# Patient Record
Sex: Female | Born: 1945 | Race: White | Hispanic: No | Marital: Married | Smoking: Former smoker
Health system: Southern US, Community
[De-identification: ages and names within clinical notes are randomized; demographics above are authoritative.]

## PROBLEM LIST (undated history)

## (undated) DIAGNOSIS — E785 Hyperlipidemia, unspecified: Secondary | ICD-10-CM

## (undated) DIAGNOSIS — R0989 Other specified symptoms and signs involving the circulatory and respiratory systems: Secondary | ICD-10-CM

## (undated) DIAGNOSIS — M810 Age-related osteoporosis without current pathological fracture: Secondary | ICD-10-CM

## (undated) DIAGNOSIS — C801 Malignant (primary) neoplasm, unspecified: Secondary | ICD-10-CM

## (undated) DIAGNOSIS — M1990 Unspecified osteoarthritis, unspecified site: Secondary | ICD-10-CM

## (undated) DIAGNOSIS — J449 Chronic obstructive pulmonary disease, unspecified: Secondary | ICD-10-CM

## (undated) DIAGNOSIS — R7309 Other abnormal glucose: Secondary | ICD-10-CM

## (undated) DIAGNOSIS — D489 Neoplasm of uncertain behavior, unspecified: Secondary | ICD-10-CM

## (undated) HISTORY — PX: MANDIBLE SURGERY: SHX707

## (undated) HISTORY — DX: Hyperlipidemia, unspecified: E78.5

## (undated) HISTORY — DX: Other specified symptoms and signs involving the circulatory and respiratory systems: R09.89

## (undated) HISTORY — PX: TONSILLECTOMY: SUR1361

## (undated) HISTORY — DX: Other abnormal glucose: R73.09

## (undated) HISTORY — DX: Neoplasm of uncertain behavior, unspecified: D48.9

---

## 1998-06-30 ENCOUNTER — Ambulatory Visit (HOSPITAL_COMMUNITY): Admission: RE | Admit: 1998-06-30 | Discharge: 1998-06-30 | Payer: Self-pay | Admitting: *Deleted

## 1998-08-14 ENCOUNTER — Ambulatory Visit (HOSPITAL_COMMUNITY): Admission: RE | Admit: 1998-08-14 | Discharge: 1998-08-14 | Payer: Self-pay | Admitting: *Deleted

## 1999-08-06 ENCOUNTER — Ambulatory Visit (HOSPITAL_COMMUNITY): Admission: RE | Admit: 1999-08-06 | Discharge: 1999-08-06 | Payer: Self-pay | Admitting: *Deleted

## 2001-10-03 ENCOUNTER — Ambulatory Visit (HOSPITAL_COMMUNITY): Admission: RE | Admit: 2001-10-03 | Discharge: 2001-10-03 | Payer: Self-pay | Admitting: Unknown Physician Specialty

## 2001-10-03 ENCOUNTER — Encounter: Payer: Self-pay | Admitting: Unknown Physician Specialty

## 2002-04-24 ENCOUNTER — Ambulatory Visit (HOSPITAL_COMMUNITY): Admission: RE | Admit: 2002-04-24 | Discharge: 2002-04-24 | Payer: Self-pay | Admitting: Internal Medicine

## 2002-04-24 ENCOUNTER — Encounter: Payer: Self-pay | Admitting: Internal Medicine

## 2002-10-09 ENCOUNTER — Ambulatory Visit (HOSPITAL_COMMUNITY): Admission: RE | Admit: 2002-10-09 | Discharge: 2002-10-09 | Payer: Self-pay | Admitting: Unknown Physician Specialty

## 2002-10-09 ENCOUNTER — Encounter: Payer: Self-pay | Admitting: Unknown Physician Specialty

## 2004-04-21 ENCOUNTER — Ambulatory Visit (HOSPITAL_COMMUNITY): Admission: RE | Admit: 2004-04-21 | Discharge: 2004-04-21 | Payer: Self-pay | Admitting: Internal Medicine

## 2005-05-04 ENCOUNTER — Ambulatory Visit (HOSPITAL_COMMUNITY): Admission: RE | Admit: 2005-05-04 | Discharge: 2005-05-04 | Payer: Self-pay | Admitting: Unknown Physician Specialty

## 2006-02-22 DIAGNOSIS — D489 Neoplasm of uncertain behavior, unspecified: Secondary | ICD-10-CM

## 2006-02-22 HISTORY — DX: Neoplasm of uncertain behavior, unspecified: D48.9

## 2006-05-16 ENCOUNTER — Ambulatory Visit (HOSPITAL_COMMUNITY): Admission: RE | Admit: 2006-05-16 | Discharge: 2006-05-16 | Payer: Self-pay | Admitting: Internal Medicine

## 2007-09-08 ENCOUNTER — Ambulatory Visit (HOSPITAL_COMMUNITY): Admission: RE | Admit: 2007-09-08 | Discharge: 2007-09-08 | Payer: Self-pay | Admitting: Internal Medicine

## 2008-10-17 ENCOUNTER — Ambulatory Visit (HOSPITAL_COMMUNITY): Admission: RE | Admit: 2008-10-17 | Discharge: 2008-10-17 | Payer: Self-pay | Admitting: Internal Medicine

## 2008-10-22 ENCOUNTER — Encounter: Admission: RE | Admit: 2008-10-22 | Discharge: 2008-10-22 | Payer: Self-pay | Admitting: Internal Medicine

## 2009-12-04 ENCOUNTER — Ambulatory Visit (HOSPITAL_COMMUNITY): Admission: RE | Admit: 2009-12-04 | Discharge: 2009-12-04 | Payer: Self-pay | Admitting: Internal Medicine

## 2010-03-16 ENCOUNTER — Encounter: Payer: Self-pay | Admitting: Internal Medicine

## 2010-11-04 ENCOUNTER — Other Ambulatory Visit (HOSPITAL_COMMUNITY): Payer: Self-pay | Admitting: Internal Medicine

## 2010-11-04 ENCOUNTER — Ambulatory Visit (HOSPITAL_COMMUNITY)
Admission: RE | Admit: 2010-11-04 | Discharge: 2010-11-04 | Disposition: A | Discharge status: Home / Self Care | Payer: BC Managed Care – PPO | Source: Ambulatory Visit | Attending: Internal Medicine | Admitting: Internal Medicine

## 2010-11-04 DIAGNOSIS — Z0000 Encounter for general adult medical examination without abnormal findings: Secondary | ICD-10-CM

## 2010-11-04 DIAGNOSIS — J4489 Other specified chronic obstructive pulmonary disease: Secondary | ICD-10-CM | POA: Insufficient documentation

## 2010-11-04 DIAGNOSIS — J449 Chronic obstructive pulmonary disease, unspecified: Secondary | ICD-10-CM | POA: Insufficient documentation

## 2010-11-04 DIAGNOSIS — Z87891 Personal history of nicotine dependence: Secondary | ICD-10-CM | POA: Insufficient documentation

## 2010-11-04 DIAGNOSIS — R911 Solitary pulmonary nodule: Secondary | ICD-10-CM | POA: Insufficient documentation

## 2010-11-05 ENCOUNTER — Other Ambulatory Visit: Payer: Self-pay | Admitting: Internal Medicine

## 2010-11-05 DIAGNOSIS — R9389 Abnormal findings on diagnostic imaging of other specified body structures: Secondary | ICD-10-CM

## 2010-11-06 ENCOUNTER — Ambulatory Visit
Admission: RE | Admit: 2010-11-06 | Discharge: 2010-11-06 | Disposition: A | Discharge status: Home / Self Care | Payer: BC Managed Care – PPO | Source: Ambulatory Visit | Attending: Internal Medicine | Admitting: Internal Medicine

## 2010-11-06 DIAGNOSIS — R9389 Abnormal findings on diagnostic imaging of other specified body structures: Secondary | ICD-10-CM

## 2010-11-11 ENCOUNTER — Other Ambulatory Visit: Payer: Self-pay | Admitting: Internal Medicine

## 2010-11-11 DIAGNOSIS — E041 Nontoxic single thyroid nodule: Secondary | ICD-10-CM

## 2010-11-13 ENCOUNTER — Other Ambulatory Visit: Payer: BC Managed Care – PPO

## 2010-11-17 ENCOUNTER — Ambulatory Visit
Admission: RE | Admit: 2010-11-17 | Discharge: 2010-11-17 | Disposition: A | Discharge status: Home / Self Care | Payer: BC Managed Care – PPO | Source: Ambulatory Visit | Attending: Internal Medicine | Admitting: Internal Medicine

## 2010-11-17 DIAGNOSIS — E041 Nontoxic single thyroid nodule: Secondary | ICD-10-CM

## 2010-12-22 ENCOUNTER — Other Ambulatory Visit (HOSPITAL_COMMUNITY): Payer: Self-pay | Admitting: Obstetrics and Gynecology

## 2010-12-22 DIAGNOSIS — Z1231 Encounter for screening mammogram for malignant neoplasm of breast: Secondary | ICD-10-CM

## 2011-01-18 ENCOUNTER — Ambulatory Visit (HOSPITAL_COMMUNITY)
Admission: RE | Admit: 2011-01-18 | Discharge: 2011-01-18 | Disposition: A | Discharge status: Home / Self Care | Payer: BC Managed Care – PPO | Source: Ambulatory Visit | Attending: Obstetrics and Gynecology | Admitting: Obstetrics and Gynecology

## 2011-01-18 DIAGNOSIS — Z1231 Encounter for screening mammogram for malignant neoplasm of breast: Secondary | ICD-10-CM | POA: Insufficient documentation

## 2011-05-17 ENCOUNTER — Other Ambulatory Visit: Payer: Self-pay | Admitting: Internal Medicine

## 2011-05-17 DIAGNOSIS — E041 Nontoxic single thyroid nodule: Secondary | ICD-10-CM

## 2011-05-20 ENCOUNTER — Other Ambulatory Visit: Payer: BC Managed Care – PPO

## 2011-05-26 ENCOUNTER — Ambulatory Visit
Admission: RE | Admit: 2011-05-26 | Discharge: 2011-05-26 | Disposition: A | Discharge status: Home / Self Care | Payer: BC Managed Care – PPO | Source: Ambulatory Visit | Attending: Internal Medicine | Admitting: Internal Medicine

## 2011-05-26 DIAGNOSIS — E041 Nontoxic single thyroid nodule: Secondary | ICD-10-CM

## 2012-01-28 ENCOUNTER — Other Ambulatory Visit (HOSPITAL_COMMUNITY): Payer: Self-pay | Admitting: Obstetrics and Gynecology

## 2012-01-28 DIAGNOSIS — Z1231 Encounter for screening mammogram for malignant neoplasm of breast: Secondary | ICD-10-CM

## 2012-02-28 ENCOUNTER — Ambulatory Visit (HOSPITAL_COMMUNITY): Payer: BC Managed Care – PPO

## 2012-03-16 ENCOUNTER — Ambulatory Visit (HOSPITAL_COMMUNITY)
Admission: RE | Admit: 2012-03-16 | Discharge: 2012-03-16 | Disposition: A | Discharge status: Home / Self Care | Payer: BC Managed Care – PPO | Source: Ambulatory Visit | Attending: Obstetrics and Gynecology | Admitting: Obstetrics and Gynecology

## 2012-03-16 DIAGNOSIS — Z1231 Encounter for screening mammogram for malignant neoplasm of breast: Secondary | ICD-10-CM

## 2012-09-19 ENCOUNTER — Encounter (HOSPITAL_COMMUNITY)
Admission: EM | Disposition: A | Discharge status: Home Health | Payer: Self-pay | Source: Home / Self Care | Attending: Orthopedic Surgery

## 2012-09-19 ENCOUNTER — Inpatient Hospital Stay (HOSPITAL_COMMUNITY): Payer: Medicare PPO

## 2012-09-19 ENCOUNTER — Ambulatory Visit (HOSPITAL_COMMUNITY)
Admission: RE | Admit: 2012-09-19 | Discharge: 2012-09-19 | Disposition: A | Discharge status: Home / Self Care | Payer: Medicare PPO | Source: Ambulatory Visit | Attending: Internal Medicine | Admitting: Internal Medicine

## 2012-09-19 ENCOUNTER — Inpatient Hospital Stay (HOSPITAL_COMMUNITY)
Admission: EM | Admit: 2012-09-19 | Discharge: 2012-09-20 | DRG: 482 | Disposition: A | Discharge status: Home Health | Payer: Medicare PPO | Attending: Orthopedic Surgery | Admitting: Orthopedic Surgery

## 2012-09-19 ENCOUNTER — Other Ambulatory Visit (HOSPITAL_COMMUNITY): Payer: Self-pay | Admitting: Internal Medicine

## 2012-09-19 ENCOUNTER — Inpatient Hospital Stay (HOSPITAL_COMMUNITY): Payer: Medicare PPO | Admitting: Anesthesiology

## 2012-09-19 ENCOUNTER — Encounter (HOSPITAL_COMMUNITY): Payer: Self-pay

## 2012-09-19 ENCOUNTER — Encounter (HOSPITAL_COMMUNITY): Payer: Self-pay | Admitting: Anesthesiology

## 2012-09-19 DIAGNOSIS — J449 Chronic obstructive pulmonary disease, unspecified: Secondary | ICD-10-CM | POA: Diagnosis present

## 2012-09-19 DIAGNOSIS — M25552 Pain in left hip: Secondary | ICD-10-CM

## 2012-09-19 DIAGNOSIS — J4489 Other specified chronic obstructive pulmonary disease: Secondary | ICD-10-CM | POA: Diagnosis present

## 2012-09-19 DIAGNOSIS — M1990 Unspecified osteoarthritis, unspecified site: Secondary | ICD-10-CM

## 2012-09-19 DIAGNOSIS — W010XXA Fall on same level from slipping, tripping and stumbling without subsequent striking against object, initial encounter: Secondary | ICD-10-CM | POA: Diagnosis present

## 2012-09-19 DIAGNOSIS — Y92009 Unspecified place in unspecified non-institutional (private) residence as the place of occurrence of the external cause: Secondary | ICD-10-CM

## 2012-09-19 DIAGNOSIS — Z79899 Other long term (current) drug therapy: Secondary | ICD-10-CM

## 2012-09-19 DIAGNOSIS — Z8249 Family history of ischemic heart disease and other diseases of the circulatory system: Secondary | ICD-10-CM

## 2012-09-19 DIAGNOSIS — Z87891 Personal history of nicotine dependence: Secondary | ICD-10-CM

## 2012-09-19 DIAGNOSIS — Z806 Family history of leukemia: Secondary | ICD-10-CM

## 2012-09-19 DIAGNOSIS — S72033A Displaced midcervical fracture of unspecified femur, initial encounter for closed fracture: Principal | ICD-10-CM | POA: Diagnosis present

## 2012-09-19 DIAGNOSIS — S7292XA Unspecified fracture of left femur, initial encounter for closed fracture: Secondary | ICD-10-CM

## 2012-09-19 DIAGNOSIS — Z7982 Long term (current) use of aspirin: Secondary | ICD-10-CM

## 2012-09-19 DIAGNOSIS — S7290XA Unspecified fracture of unspecified femur, initial encounter for closed fracture: Secondary | ICD-10-CM

## 2012-09-19 DIAGNOSIS — M810 Age-related osteoporosis without current pathological fracture: Secondary | ICD-10-CM | POA: Diagnosis present

## 2012-09-19 HISTORY — DX: Age-related osteoporosis without current pathological fracture: M81.0

## 2012-09-19 HISTORY — DX: Unspecified osteoarthritis, unspecified site: M19.90

## 2012-09-19 HISTORY — DX: Chronic obstructive pulmonary disease, unspecified: J44.9

## 2012-09-19 HISTORY — PX: HIP PINNING,CANNULATED: SHX1758

## 2012-09-19 LAB — CBC WITH DIFFERENTIAL/PLATELET
Basophils Absolute: 0 10*3/uL (ref 0.0–0.1)
Basophils Relative: 0 % (ref 0–1)
Eosinophils Absolute: 0.2 10*3/uL (ref 0.0–0.7)
HCT: 41.8 % (ref 36.0–46.0)
MCHC: 34 g/dL (ref 30.0–36.0)
MCV: 91.3 fL (ref 78.0–100.0)
Neutro Abs: 7.3 10*3/uL (ref 1.7–7.7)
Platelets: 248 10*3/uL (ref 150–400)
RBC: 4.58 MIL/uL (ref 3.87–5.11)
RDW: 12.9 % (ref 11.5–15.5)

## 2012-09-19 LAB — BASIC METABOLIC PANEL
BUN: 9 mg/dL (ref 6–23)
Chloride: 101 mEq/L (ref 96–112)
Creatinine, Ser: 0.5 mg/dL (ref 0.50–1.10)
GFR calc Af Amer: >90 mL/min (ref >90)
Glucose, Bld: 122 mg/dL — ABNORMAL HIGH (ref 70–99)
Potassium: 3.9 mEq/L (ref 3.5–5.1)
Sodium: 139 mEq/L (ref 135–145)

## 2012-09-19 LAB — PROTIME-INR: INR: 0.96 (ref 0.00–1.49)

## 2012-09-19 SURGERY — FIXATION, FEMUR, NECK, PERCUTANEOUS, USING SCREW
Anesthesia: Spinal | Site: Hip | Laterality: Left | Wound class: Clean

## 2012-09-19 MED ORDER — HYDROMORPHONE HCL PF 1 MG/ML IJ SOLN
0.2500 mg | INTRAMUSCULAR | Status: DC | PRN
  Administered 2012-09-19 (×2): 0.5 mg via INTRAVENOUS

## 2012-09-19 MED ORDER — KETOROLAC TROMETHAMINE 30 MG/ML IJ SOLN: 15.0000 mg | Freq: Once | INTRAMUSCULAR | Status: AC | PRN

## 2012-09-19 MED ORDER — HYDROMORPHONE HCL PF 1 MG/ML IJ SOLN
INTRAMUSCULAR | Status: AC
  Filled 2012-09-19: qty 1

## 2012-09-19 MED ORDER — ONDANSETRON HCL 4 MG/2ML IJ SOLN
INTRAMUSCULAR | Status: DC | PRN
  Administered 2012-09-19: 4 mg via INTRAVENOUS

## 2012-09-19 MED ORDER — ONDANSETRON HCL 4 MG/2ML IJ SOLN: 4.0000 mg | Freq: Once | INTRAMUSCULAR | Status: AC | PRN

## 2012-09-19 MED ORDER — GLYCOPYRROLATE 0.2 MG/ML IJ SOLN
INTRAMUSCULAR | Status: DC | PRN
  Administered 2012-09-19: 0.2 mg via INTRAVENOUS

## 2012-09-19 MED ORDER — FENTANYL CITRATE 0.05 MG/ML IJ SOLN
INTRAMUSCULAR | Status: DC | PRN
  Administered 2012-09-19 (×2): 25 ug via INTRAVENOUS
  Administered 2012-09-19: 100 ug via INTRAVENOUS

## 2012-09-19 MED ORDER — CEFAZOLIN SODIUM-DEXTROSE 2-3 GM-% IV SOLR
INTRAVENOUS | Status: DC | PRN
  Administered 2012-09-19: 2 g via INTRAVENOUS

## 2012-09-19 MED ORDER — CEFAZOLIN SODIUM 1-5 GM-% IV SOLN
INTRAVENOUS | Status: AC
  Filled 2012-09-19: qty 100

## 2012-09-19 MED ORDER — LACTATED RINGERS IV SOLN
INTRAVENOUS | Status: DC | PRN
  Administered 2012-09-19: 21:00:00 via INTRAVENOUS

## 2012-09-19 MED ORDER — 0.9 % SODIUM CHLORIDE (POUR BTL) OPTIME
TOPICAL | Status: DC | PRN
  Administered 2012-09-19: 1000 mL

## 2012-09-19 MED ORDER — PROPOFOL 10 MG/ML IV BOLUS
INTRAVENOUS | Status: DC | PRN
  Administered 2012-09-19: 160 mg via INTRAVENOUS

## 2012-09-19 MED ORDER — LIDOCAINE HCL (CARDIAC) 20 MG/ML IV SOLN
INTRAVENOUS | Status: DC | PRN
  Administered 2012-09-19: 50 mg via INTRAVENOUS

## 2012-09-19 SURGICAL SUPPLY — 40 items
BANDAGE GAUZE ELAST BULKY 4 IN (GAUZE/BANDAGES/DRESSINGS) ×2 IMPLANT
BLADE SURG 10 STRL SS (BLADE) ×2 IMPLANT
BNDG COHESIVE 4X5 TAN STRL (GAUZE/BANDAGES/DRESSINGS) ×2 IMPLANT
CHLORAPREP W/TINT 26ML (MISCELLANEOUS) ×2 IMPLANT
CLOTH BEACON ORANGE TIMEOUT ST (SAFETY) ×2 IMPLANT
COVER MAYO STAND STRL (DRAPES) ×2 IMPLANT
COVER SURGICAL LIGHT HANDLE (MISCELLANEOUS) ×2 IMPLANT
DRAPE ORTHO SPLIT 77X108 STRL (DRAPES) ×4
DRAPE STERI IOBAN 125X83 (DRAPES) ×2 IMPLANT
DRAPE SURG 17X23 STRL (DRAPES) ×8 IMPLANT
DRAPE SURG ORHT 6 SPLT 77X108 (DRAPES) ×2 IMPLANT
DRSG MEPILEX BORDER 4X4 (GAUZE/BANDAGES/DRESSINGS) ×2 IMPLANT
DRSG PAD ABDOMINAL 8X10 ST (GAUZE/BANDAGES/DRESSINGS) ×6 IMPLANT
ELECT REM PT RETURN 9FT ADLT (ELECTROSURGICAL) ×2
ELECTRODE REM PT RTRN 9FT ADLT (ELECTROSURGICAL) ×1 IMPLANT
GLOVE BIO SURGEON STRL SZ7 (GLOVE) ×2 IMPLANT
GLOVE BIO SURGEON STRL SZ7.5 (GLOVE) ×2 IMPLANT
GLOVE BIOGEL PI IND STRL 8 (GLOVE) ×1 IMPLANT
GLOVE BIOGEL PI INDICATOR 8 (GLOVE) ×1
GOWN PREVENTION PLUS LG XLONG (DISPOSABLE) ×2 IMPLANT
GOWN PREVENTION PLUS XLARGE (GOWN DISPOSABLE) ×4 IMPLANT
GOWN STRL NON-REIN LRG LVL3 (GOWN DISPOSABLE) ×2 IMPLANT
GUIDEWIRE BALL NOSE 80CM (WIRE) ×2 IMPLANT
GUIDEWIRE THREADED 2.8 (WIRE) ×3 IMPLANT
KIT ROOM TURNOVER OR (KITS) ×2 IMPLANT
MANIFOLD NEPTUNE II (INSTRUMENTS) ×2 IMPLANT
NS IRRIG 1000ML POUR BTL (IV SOLUTION) ×2 IMPLANT
PACK GENERAL/GYN (CUSTOM PROCEDURE TRAY) ×2 IMPLANT
PAD ARMBOARD 7.5X6 YLW CONV (MISCELLANEOUS) ×4 IMPLANT
PAD CAST 4YDX4 CTTN HI CHSV (CAST SUPPLIES) ×1 IMPLANT
PADDING CAST COTTON 4X4 STRL (CAST SUPPLIES) ×2
SCREW CANN TI 16 THRD 6.5X85 (Screw) ×3 IMPLANT
STAPLER VISISTAT 35W (STAPLE) ×2 IMPLANT
SUT VIC AB 1 CT1 27 (SUTURE) ×2
SUT VIC AB 1 CT1 27XBRD ANBCTR (SUTURE) ×1 IMPLANT
SUT VIC AB 2-0 CT1 27 (SUTURE) ×2
SUT VIC AB 2-0 CT1 TAPERPNT 27 (SUTURE) ×1 IMPLANT
TOWEL OR 17X24 6PK STRL BLUE (TOWEL DISPOSABLE) ×2 IMPLANT
TOWEL OR 17X26 10 PK STRL BLUE (TOWEL DISPOSABLE) ×2 IMPLANT
WATER STERILE IRR 1000ML POUR (IV SOLUTION) ×2 IMPLANT

## 2012-09-19 NOTE — Anesthesia Preprocedure Evaluation (Addendum)
Anesthesia Evaluation  Patient identified by MRN, date of birth, ID band Patient awake    Reviewed: Allergy & Precautions, H&P , NPO status , Patient's Chart, lab work & pertinent test results, reviewed documented beta blocker date and time   Airway Mallampati: I  Neck ROM: Full    Dental  (+) Teeth Intact and Dental Advisory Given   Pulmonary COPD breath sounds clear to auscultation        Cardiovascular Rhythm:Regular Rate:Normal     Neuro/Psych    GI/Hepatic   Endo/Other    Renal/GU      Musculoskeletal   Abdominal   Peds  Hematology   Anesthesia Other Findings   Reproductive/Obstetrics                          Anesthesia Physical Anesthesia Plan  ASA: II  Anesthesia Plan: Spinal   Post-op Pain Management:    Induction: Intravenous  Airway Management Planned: Natural Airway and Simple Face Mask  Additional Equipment:   Intra-op Plan:   Post-operative Plan:   Informed Consent: I have reviewed the patients History and Physical, chart, labs and discussed the procedure including the risks, benefits and alternatives for the proposed anesthesia with the patient or authorized representative who has indicated his/her understanding and acceptance.   Dental advisory given  Plan Discussed with: CRNA and Anesthesiologist  Anesthesia Plan Comments: (L. Femoral Neck Fracture Mild COPD asymptomatic  Plan SAB)        Anesthesia Quick Evaluation

## 2012-09-19 NOTE — Preoperative (Signed)
Beta Blockers   Reason not to administer Beta Blockers:Not Applicable. No home beta blockers 

## 2012-09-19 NOTE — ED Provider Notes (Signed)
CSN: [id number]     Arrival date & time 09/19/12  1722 History     First MD Initiated Contact with Patient 09/19/12 1807     Chief Complaint  Patient presents with  . Hip Pain   (Consider location/radiation/quality/duration/timing/severity/associated sxs/prior Treatment) HPI Pt presenting with left hip pain.  She had a mechanical fall 2 days ago.  Has been having pain in left hp especially with weight bearing.  States she also hit left elbow but this is no longer bothering her.  Denies neck or back pain.  Denies striking head.  She had xrays today and was advised to come to the ED where Dr. Ave Filter, ortho would see her.  Pain worse with movement and palpation.  There are no other associated systemic symptoms, there are no other alleviating or modifying factors.   Past Medical History  Diagnosis Date  . Arthritis   . COPD (chronic obstructive pulmonary disease)   . Osteoporosis    Past Surgical History  Procedure Laterality Date  . Tonsillectomy    . Mandible surgery     Family History  Problem Relation Age of Onset  . CAD Paternal Grandfather   . CAD Maternal Grandfather   . Hypertension Mother   . Cancer - Other Father     Leukemia and Melanoma   History  Substance Use Topics  . Smoking status: Former Games developer  . Smokeless tobacco: Not on file  . Alcohol Use: Yes   OB History   Grav Para Term Preterm Abortions TAB SAB Ect Mult Living                 Review of Systems ROS reviewed and all otherwise negative except for mentioned in HPI  Allergies  Review of patient's allergies indicates no known allergies.  Home Medications   Current Outpatient Rx  Name  Route  Sig  Dispense  Refill  . Calcium Carbonate-Vitamin D (CALCIUM + D PO)   Oral   Take 1 tablet by mouth daily.         . Cholecalciferol (VITAMIN D PO)   Oral   Take 1 tablet by mouth daily.         Marland Kitchen CINNAMON PO   Oral   Take 1 capsule by mouth 2 (two) times daily.         . fish oil-omega-3  fatty acids 1000 MG capsule   Oral   Take 2 g by mouth daily.         . Misc Natural Products (OSTEO BI-FLEX JOINT SHIELD PO)   Oral   Take 1 tablet by mouth 2 (two) times daily.         . Multiple Vitamins-Minerals (MULTIVITAMIN WITH MINERALS) tablet   Oral   Take 1 tablet by mouth daily.         . Multiple Vitamins-Minerals (PRESERVISION AREDS PO)   Oral   Take 1 tablet by mouth daily.         Marland Kitchen aspirin EC 325 MG tablet   Oral   Take 1 tablet (325 mg total) by mouth 2 (two) times daily. Treatment for 3 weeks; then resume daily baby ASA.   42 tablet   0   . HYDROcodone-acetaminophen (NORCO) 5-325 MG per tablet   Oral   Take 1 tablet by mouth every 6 (six) hours as needed for pain.   30 tablet   0   . pantoprazole (PROTONIX) 40 MG tablet   Oral   Take 1  tablet (40 mg total) by mouth daily before breakfast.   30 tablet   1    BP 125/64  Pulse 64  Temp(Src) 98.6 F (37 C) (Oral)  Resp 18  Ht 5\' 8"  (1.727 m)  Wt 120 lb (54.432 kg)  BMI 18.25 kg/m2  SpO2 97% Vitals reviewed Physical Exam Physical Examination: General appearance - alert, well appearing, and in no distress Mental status - alert, oriented to person, place, and time Eyes - no scleral icterus Chest - clear to auscultation, no wheezes, rales or rhonchi, symmetric air entry Heart - normal rate, regular rhythm, normal S1, S2, no murmurs, rubs, clicks or gallops Neurological - alert, oriented, normal speech, strength 5/5 in extremities x 4, sensation intact Musculoskeletal - no joint tenderness, deformity or swelling- pain with ROM of left hip Extremities - peripheral pulses normal, no pedal edema, no clubbing or cyanosis Skin - normal coloration and turgor, no rashes  ED Course   Procedures (including critical care time)  6:44 PM per patient Dr. Ave Filter has been notified about this patient coming to the ED by patients PMD- will call to let him know she is here.  She declines pain medication at  this time.  Instructed to remain NPO.    6:54 PM d/w Dr. Ave Filter, he would like patient to be admitted to triad.    Labs Reviewed  BASIC METABOLIC PANEL - Abnormal; Notable for the following:    Glucose, Bld 122 (*)    All other components within normal limits  BASIC METABOLIC PANEL - Abnormal; Notable for the following:    Potassium 3.3 (*)    Glucose, Bld 103 (*)    Calcium 8.2 (*)    All other components within normal limits  CBC - Abnormal; Notable for the following:    RBC 3.86 (*)    Hemoglobin 11.8 (*)    HCT 35.4 (*)    All other components within normal limits  CBC WITH DIFFERENTIAL  APTT  PROTIME-INR   Dg Chest 2 View  09/19/2012   *RADIOLOGY REPORT*  Clinical Data: Pain.  CHEST - 2 VIEW  Comparison: Chest x-ray 11/04/2010  Findings: The lungs are hyperinflated.  Heart size is normal. There are no focal consolidations or pleural effusions. Small linear density in the right lung apex appears stable, consistent with scar or atelectasis.  No pulmonary edema.  There is convex right scoliosis, unchanged in appearance.  IMPRESSION: No evidence for acute cardiopulmonary abnormality.   Original Report Authenticated By: Norva Pavlov, M.D.   Dg Pelvis 1-2 Views  09/19/2012   *RADIOLOGY REPORT*  Clinical Data: Larey Seat on Sunday 09/17/2012.  Pain in the left hip.  PELVIS - 1-2 VIEW  Comparison: Views of the left hip 09/19/2012, views of the left hip 09/19/2012  Findings: There is lucency along the medial aspect of the left femoral neck, associated with deformity of the femoral neck. Findings are consistent with acute, minimally displaced fracture. The remainder the pelvis appears intact.  Regional bowel gas pattern is nonobstructive.  IMPRESSION: Left femoral neck fracture.   Original Report Authenticated By: Norva Pavlov, M.D.   Dg Hip Complete Left  09/19/2012   *RADIOLOGY REPORT*  Clinical Data: Larey Seat on Sunday 09/17/2012.  Left hip pain with weightbearing.  LEFT HIP - COMPLETE 2+  VIEW  Comparison: Pelvis 09/19/2012  Findings: There is a comminuted but minimally displaced fracture of the left hip involving the subcapital femoral neck region.  No evidence for dislocation.  Visualized portion of the  pelvis is intact.  IMPRESSION: Subcapital femoral neck fracture.  The findings were discussed with Loree Fee, P.A. on 09/19/2012 at 4:31 p.m.   Original Report Authenticated By: Norva Pavlov, M.D.   Dg Hip Operative Left  09/19/2012   *RADIOLOGY REPORT*  Clinical Data: ORIF left hip.  OPERATIVE LEFT HIP  Comparison: 09/19/2012.  Findings: Two projections of the left hip by intraoperative fluoroscopy show screw fixation of a femoral neck fracture.  No extra osseous extension of screw tips.  No evidence of new osseous fracture.  The hip joint is located.  IMPRESSION: Intraoperative fluoroscopy documenting left femoral neck fixation.   Original Report Authenticated By: Tiburcio Pea   Dg Pelvis Portable  09/19/2012   *RADIOLOGY REPORT*  Clinical Data: Postoperative radiographs.  Left hip ORIF.  PORTABLE PELVIS  Comparison: 09/19/2012.  Findings: ORIF of the left hip with three cannulated lag screws. No complicating features.  Visualized pelvic rings appear normal.  IMPRESSION: Cannulated lag screw placement in the left hip.   Original Report Authenticated By: Andreas Newport, M.D.   1. Femur fracture, left, closed, initial encounter   2. Osteoporosis, unspecified   3. Arthritis   4. COPD (chronic obstructive pulmonary disease)     MDM  Pt presenting after mechanical fall with left femur fracture diagnosed on outside xrays.  No other areas of injury.  I have d/w Dr. Ave Filter who requests admsision to medicine.  I have called medicine service.  Pt declined pain medications during my eval.    Ethelda Chick, MD 09/21/12 [phone]

## 2012-09-19 NOTE — Transfer of Care (Signed)
Immediate Anesthesia Transfer of Care Note  Patient: Katie Morton  Procedure(s) Performed: Procedure(s): Percutaneous Screw Fixation Left Hip Fracture (Left)  Patient Location: PACU  Anesthesia Type:General  Level of Consciousness: awake, alert  and oriented  Airway & Oxygen Therapy: Patient Spontanous Breathing and Patient connected to nasal cannula oxygen  Post-op Assessment: Report given to PACU RN and Post -op Vital signs reviewed and stable  Post vital signs: Reviewed and stable  Complications: No apparent anesthesia complications

## 2012-09-19 NOTE — Consult Note (Signed)
Reason for Consult: Evaluate left hip fracture Referring Physician: EDP  Katie Morton is an 67 y.o. female.  HPI: 68 year old female who was walking 2 days ago in [place] and tripped falling directly on her left side. She had immediate pain and some difficulty walking. She drove back from [place] the next day and was seen today at her primary care doctor's office. She had x-rays done which revealed a impacted left femoral neck fracture. I was called for evaluation and management. Her pain at this point is fairly minimal unless she tries to move or step-off. She has pain with weightbearing. She denies other injuries with the fall. She is quite healthy and takes an aspirin a day and supplements.  Past Medical History  Diagnosis Date  . Arthritis   . COPD (chronic obstructive pulmonary disease)     Past Surgical History  Procedure Laterality Date  . Tonsillectomy    . Mandible surgery      History reviewed. No pertinent family history.  Social History:  reports that she has quit smoking. She does not have any smokeless tobacco history on file. She reports that  drinks alcohol. She reports that she does not use illicit drugs.  Allergies: No Known Allergies  Medications: I have reviewed the patient's current medications.  Results for orders placed during the hospital encounter of 09/19/12 (from the past 48 hour(s))  CBC WITH DIFFERENTIAL     Status: None   Collection Time    09/19/12  6:32 PM      Result Value Range   WBC 10.4  4.0 - 10.5 K/uL   RBC 4.58  3.87 - 5.11 MIL/uL   Hemoglobin 14.2  12.0 - 15.0 g/dL   HCT 16.1  09.6 - 04.5 %   MCV 91.3  78.0 - 100.0 fL   MCH 31.0  26.0 - 34.0 pg   MCHC 34.0  30.0 - 36.0 g/dL   RDW 40.9  81.1 - 91.4 %   Platelets 248  150 - 400 K/uL   Neutrophils Relative % 70  43 - 77 %   Neutro Abs 7.3  1.7 - 7.7 K/uL   Lymphocytes Relative 21  12 - 46 %   Lymphs Abs 2.2  0.7 - 4.0 K/uL   Monocytes Relative 6  3 - 12 %   Monocytes  Absolute 0.7  0.1 - 1.0 K/uL   Eosinophils Relative 2  0 - 5 %   Eosinophils Absolute 0.2  0.0 - 0.7 K/uL   Basophils Relative 0  0 - 1 %   Basophils Absolute 0.0  0.0 - 0.1 K/uL  BASIC METABOLIC PANEL     Status: Abnormal   Collection Time    09/19/12  6:32 PM      Result Value Range   Sodium 139  135 - 145 mEq/L   Potassium 3.9  3.5 - 5.1 mEq/L   Chloride 101  96 - 112 mEq/L   CO2 27  19 - 32 mEq/L   Glucose, Bld 122 (*) 70 - 99 mg/dL   BUN 9  6 - 23 mg/dL   Creatinine, Ser 7.82  0.50 - 1.10 mg/dL   Calcium 9.2  8.4 - 95.6 mg/dL   GFR calc non Af Amer >90  >90 mL/min   GFR calc Af Amer >90  >90 mL/min   Comment:            The eGFR has been calculated     using the CKD EPI  equation.     This calculation has not been     validated in all clinical     situations.     eGFR's persistently     <90 mL/min signify     possible Chronic Kidney Disease.  APTT     Status: None   Collection Time    09/19/12  6:32 PM      Result Value Range   aPTT 28  24 - 37 seconds  PROTIME-INR     Status: None   Collection Time    09/19/12  6:32 PM      Result Value Range   Prothrombin Time 12.6  11.6 - 15.2 seconds   INR 0.96  0.00 - 1.49    Dg Chest 2 View  09/19/2012   *RADIOLOGY REPORT*  Clinical Data: Pain.  CHEST - 2 VIEW  Comparison: Chest x-ray 11/04/2010  Findings: The lungs are hyperinflated.  Heart size is normal. There are no focal consolidations or pleural effusions. Small linear density in the right lung apex appears stable, consistent with scar or atelectasis.  No pulmonary edema.  There is convex right scoliosis, unchanged in appearance.  IMPRESSION: No evidence for acute cardiopulmonary abnormality.   Original Report Authenticated By: Norva Pavlov, M.D.   Dg Pelvis 1-2 Views  09/19/2012   *RADIOLOGY REPORT*  Clinical Data: Larey Seat on Sunday 09/17/2012.  Pain in the left hip.  PELVIS - 1-2 VIEW  Comparison: Views of the left hip 09/19/2012, views of the left hip 09/19/2012   Findings: There is lucency along the medial aspect of the left femoral neck, associated with deformity of the femoral neck. Findings are consistent with acute, minimally displaced fracture. The remainder the pelvis appears intact.  Regional bowel gas pattern is nonobstructive.  IMPRESSION: Left femoral neck fracture.   Original Report Authenticated By: Norva Pavlov, M.D.   Dg Hip Complete Left  09/19/2012   *RADIOLOGY REPORT*  Clinical Data: Larey Seat on Sunday 09/17/2012.  Left hip pain with weightbearing.  LEFT HIP - COMPLETE 2+ VIEW  Comparison: Pelvis 09/19/2012  Findings: There is a comminuted but minimally displaced fracture of the left hip involving the subcapital femoral neck region.  No evidence for dislocation.  Visualized portion of the pelvis is intact.  IMPRESSION: Subcapital femoral neck fracture.  The findings were discussed with Loree Fee, P.A. on 09/19/2012 at 4:31 p.m.   Original Report Authenticated By: Norva Pavlov, M.D.    Review of Systems  All other systems reviewed and are negative.   Blood pressure 159/72, pulse 64, temperature 98.2 F (36.8 C), temperature source Oral, resp. rate 16, height 5\' 8"  (1.727 m), weight 54.432 kg (120 lb), SpO2 99.00%. Physical Exam  Constitutional: She is oriented to person, place, and time. She appears well-developed and well-nourished.  HENT:  Head: Atraumatic.  Eyes: EOM are normal.  Cardiovascular: Intact distal pulses.   Respiratory: Effort normal.  Musculoskeletal:       Left hip: She exhibits tenderness.  No lower extremity malalignment or deformity. No rotational deformity. Distally both lower extremities are neurovascularly intact.  Neurological: She is alert and oriented to person, place, and time.  Skin: Skin is warm and dry.  Psychiatric: She has a normal mood and affect.    Assessment/Plan: Left valgus impacted femoral neck fracture Recommend percutaneous screw fixation to prevent complications of displacement Will  allow early ambulation. Risks benefits discussed the patient wishes to go forward with surgery.  Katie Morton 09/19/2012, 7:38 PM

## 2012-09-19 NOTE — Discharge Instructions (Signed)
Discharge Instructions after Hip Surgery ° ° °You can bear weight and ambulate as tolerated on both legs °Use ice on the hip intermittently over the first 48 hours after surgery.  °Pain medicine has been prescribed for you.  °Use your medicine liberally over the first 48 hours, and then you can begin to taper your use. You may take Extra Strength Tylenol or Tylenol only in place of the pain pills. DO NOT take ANY nonsteroidal anti-inflammatory pain medications: Advil, Motrin, Ibuprofen, Aleve, Naproxen or Naprosyn.  °Take aspirin or anticoagulant medication as prescribed for 2 weeks after surgery. Please notify if allergic or sensitivity to aspirin. °You may remove your dressing after two days.  °You may shower 5 days after surgery. The incisions CANNOT get wet prior to 5 days. Simply allow the water to wash over the site and then pat dry. Do not rub the incisions. ° ° ° °Please call [phone] during normal business hours or [phone] after hours for any problems. Including the following: ° °- excessive redness of the incisions °- drainage for more than 4 days °- fever of more than 101.5 F ° °*Please note that pain medications will not be refilled after hours or on weekends. ° °

## 2012-09-19 NOTE — ED Notes (Signed)
Pt c/o Left hip pain, pt reports tripping and falling Sunday, had XR's completed at [facility] and Dr. Ave Filter; Orthopedic is to f/u with pt for possible surgery

## 2012-09-19 NOTE — Op Note (Signed)
09/19/2012  9:33 PM  PATIENT:  Katie Morton    PRE-OPERATIVE DIAGNOSIS:  Left Femoral Neck Fracture  POST-OPERATIVE DIAGNOSIS:  Same  PROCEDURE:  Percutaneous Screw Fixation Left Hip Fracture  SURGEON:  Mable Paris, MD  PHYSICIAN ASSISTANT: none  ANESTHESIA:   General  PREOPERATIVE INDICATIONS:  Katie Morton is a  67 y.o. female who fell and was found to have a diagnosis of Left Femoral Neck Fracture who elected for surgical management.    The risks benefits and alternatives were discussed with the patient preoperatively including but not limited to the risks of infection, bleeding, nerve injury, cardiopulmonary complications, blood clots, malunion, nonunion, avascular necrosis, the need for revision surgery, the potential for conversion to hemiarthroplasty, among others, and the patient was willing to proceed.  OPERATIVE IMPLANTS: 6.5 mm cannulated screws x3  OPERATIVE FINDINGS: Clinical osteoporosis with weak bone, proximal femur  OPERATIVE PROCEDURE: The patient was brought to the operating room and placed in supine position. IV antibiotics were given. General anesthesia administered. Foley was also given. She was placed on the fracture table. The left lower extremity was positioned, without any significant reduction maneuver. The left lower extremity was prepped and draped in usual sterile fashion.  Time out was performed.  Small incision was made distal to the greater trochanter, and 3 guidewires were introduced Into an inverted triangle configuration. The lengths were measured. The reduction was slightly valgus, and near-anatomic. I opened the cortex with a cannulated drill, and then placed the screws into position. Satisfactory fixation was achieved.  The wounds were irrigated copiously, and repaired with Vicryl with Steri-Strips and sterile gauze. There no complications and the patient tolerated the procedure well.  The patient will be weightbearing as  tolerated, and will be on ECASA BID for a period of one month postoperatively.

## 2012-09-20 ENCOUNTER — Encounter (HOSPITAL_COMMUNITY): Payer: Self-pay | Admitting: *Deleted

## 2012-09-20 DIAGNOSIS — M810 Age-related osteoporosis without current pathological fracture: Secondary | ICD-10-CM

## 2012-09-20 DIAGNOSIS — J449 Chronic obstructive pulmonary disease, unspecified: Secondary | ICD-10-CM

## 2012-09-20 DIAGNOSIS — M129 Arthropathy, unspecified: Secondary | ICD-10-CM

## 2012-09-20 DIAGNOSIS — S7290XA Unspecified fracture of unspecified femur, initial encounter for closed fracture: Secondary | ICD-10-CM

## 2012-09-20 HISTORY — DX: Unspecified fracture of unspecified femur, initial encounter for closed fracture: S72.90XA

## 2012-09-20 LAB — CBC
HCT: 35.4 % — ABNORMAL LOW (ref 36.0–46.0)
Hemoglobin: 11.8 g/dL — ABNORMAL LOW (ref 12.0–15.0)
MCH: 30.6 pg (ref 26.0–34.0)
MCHC: 33.3 g/dL (ref 30.0–36.0)
MCV: 91.7 fL (ref 78.0–100.0)
Platelets: 200 10*3/uL (ref 150–400)
RBC: 3.86 MIL/uL — ABNORMAL LOW (ref 3.87–5.11)
RDW: 12.9 % (ref 11.5–15.5)
WBC: 10.2 10*3/uL (ref 4.0–10.5)

## 2012-09-20 LAB — BASIC METABOLIC PANEL
CO2: 28 mEq/L (ref 19–32)
Chloride: 99 mEq/L (ref 96–112)
Creatinine, Ser: 0.58 mg/dL (ref 0.50–1.10)
GFR calc Af Amer: >90 mL/min (ref >90)
Potassium: 3.3 mEq/L — ABNORMAL LOW (ref 3.5–5.1)
Sodium: 136 mEq/L (ref 135–145)

## 2012-09-20 MED ORDER — PANTOPRAZOLE SODIUM 40 MG PO TBEC: 40.0000 mg | DELAYED_RELEASE_TABLET | Freq: Every day | ORAL | Status: DC

## 2012-09-20 MED ORDER — ONDANSETRON HCL 4 MG PO TABS: 4.0000 mg | ORAL_TABLET | Freq: Four times a day (QID) | ORAL | Status: DC | PRN

## 2012-09-20 MED ORDER — HYDROCODONE-ACETAMINOPHEN 5-325 MG PO TABS
1.0000 | ORAL_TABLET | Freq: Four times a day (QID) | ORAL | Status: DC | PRN
  Administered 2012-09-20: 1 via ORAL
  Filled 2012-09-20: qty 1

## 2012-09-20 MED ORDER — SODIUM CHLORIDE 0.9 % IV SOLN
INTRAVENOUS | Status: DC
  Administered 2012-09-20: 04:00:00 via INTRAVENOUS

## 2012-09-20 MED ORDER — ALUM & MAG HYDROXIDE-SIMETH 200-200-20 MG/5ML PO SUSP: 30.0000 mL | Freq: Four times a day (QID) | ORAL | Status: DC | PRN

## 2012-09-20 MED ORDER — ACETAMINOPHEN 325 MG PO TABS: 650.0000 mg | ORAL_TABLET | Freq: Four times a day (QID) | ORAL | Status: DC | PRN

## 2012-09-20 MED ORDER — PANTOPRAZOLE SODIUM 40 MG IV SOLR
40.0000 mg | INTRAVENOUS | Status: DC
  Administered 2012-09-20: 40 mg via INTRAVENOUS
  Filled 2012-09-20: qty 40

## 2012-09-20 MED ORDER — OXYCODONE HCL 5 MG PO TABS
5.0000 mg | ORAL_TABLET | ORAL | Status: DC | PRN
  Administered 2012-09-20: 5 mg via ORAL
  Filled 2012-09-20: qty 1

## 2012-09-20 MED ORDER — ONDANSETRON HCL 4 MG/2ML IJ SOLN: 4.0000 mg | Freq: Four times a day (QID) | INTRAMUSCULAR | Status: DC | PRN

## 2012-09-20 MED ORDER — ACETAMINOPHEN 650 MG RE SUPP: 650.0000 mg | Freq: Four times a day (QID) | RECTAL | Status: DC | PRN

## 2012-09-20 MED ORDER — METOCLOPRAMIDE HCL 5 MG/ML IJ SOLN: 5.0000 mg | Freq: Three times a day (TID) | INTRAMUSCULAR | Status: DC | PRN

## 2012-09-20 MED ORDER — MENTHOL 3 MG MT LOZG: 1.0000 | LOZENGE | OROMUCOSAL | Status: DC | PRN

## 2012-09-20 MED ORDER — MORPHINE SULFATE 2 MG/ML IJ SOLN: 0.5000 mg | INTRAMUSCULAR | Status: DC | PRN

## 2012-09-20 MED ORDER — HYDROMORPHONE HCL PF 1 MG/ML IJ SOLN: 1.0000 mg | INTRAMUSCULAR | Status: DC | PRN

## 2012-09-20 MED ORDER — ASPIRIN EC 325 MG PO TBEC: 325.0000 mg | DELAYED_RELEASE_TABLET | Freq: Two times a day (BID) | ORAL | Status: DC

## 2012-09-20 MED ORDER — KCL IN DEXTROSE-NACL 20-5-0.45 MEQ/L-%-% IV SOLN: INTRAVENOUS | Status: DC

## 2012-09-20 MED ORDER — ACETAMINOPHEN 325 MG PO TABS
650.0000 mg | ORAL_TABLET | Freq: Four times a day (QID) | ORAL | Status: DC | PRN
  Filled 2012-09-20: qty 1

## 2012-09-20 MED ORDER — METOCLOPRAMIDE HCL 5 MG PO TABS: 5.0000 mg | ORAL_TABLET | Freq: Three times a day (TID) | ORAL | Status: DC | PRN

## 2012-09-20 MED ORDER — ASPIRIN EC 325 MG PO TBEC
325.0000 mg | DELAYED_RELEASE_TABLET | Freq: Two times a day (BID) | ORAL | Status: DC
  Administered 2012-09-20: 325 mg via ORAL
  Filled 2012-09-20: qty 1

## 2012-09-20 MED ORDER — CEFAZOLIN SODIUM-DEXTROSE 2-3 GM-% IV SOLR
2.0000 g | Freq: Four times a day (QID) | INTRAVENOUS | Status: AC
  Administered 2012-09-20 (×2): 2 g via INTRAVENOUS
  Filled 2012-09-20 (×2): qty 50

## 2012-09-20 MED ORDER — HYDROCODONE-ACETAMINOPHEN 5-325 MG PO TABS: 1.0000 | ORAL_TABLET | Freq: Four times a day (QID) | ORAL | Status: DC | PRN

## 2012-09-20 MED ORDER — PHENOL 1.4 % MT LIQD: 1.0000 | OROMUCOSAL | Status: DC | PRN

## 2012-09-20 NOTE — Progress Notes (Signed)
Patient seen and examined. Admitted/seen after midnight by Dr. Lovell Sheehan; please referred to admitting notes for further info/details. Admitted secondary to mechanical fall with subsequent left hip fracture. Patient is S/P percutaneous screw fixation of left hip.  Plan: -PT/OT evaluation -advance diet -oral pain meds -weight bearing as tolerated.  Katie Morton [phone]

## 2012-09-20 NOTE — Progress Notes (Signed)
PATIENT ID: Katie Morton   1 Day Post-Op Procedure(s) (LRB): Percutaneous Screw Fixation Left Hip Fracture (Left)  Subjective: no complaints.  Pain is minimal.  Objective:  Filed Vitals:   09/20/12 0800  BP:   Pulse:   Temp:   Resp: 16     Left hip dressing with small blood.  No swelling.  Distally neurovascularly intact.  Labs:   Recent Labs  09/19/12 1832 09/20/12 0510  HGB 14.2 11.8*   Recent Labs  09/19/12 1832 09/20/12 0510  WBC 10.4 10.2  RBC 4.58 3.86*  HCT 41.8 35.4*  PLT 248 200   Recent Labs  09/19/12 1832 09/20/12 0510  NA 139 136  K 3.9 3.3*  CL 101 99  CO2 27 28  BUN 9 8  CREATININE 0.50 0.58  GLUCOSE 122* 103*  CALCIUM 9.2 8.2*    Assessment and Plan:doing well postoperative day #1 status post left hip CRPP Weightbearing as tolerated Aspirin twice daily for DVT prophylaxis for 3 weeks Okay for DC from ortho standpoint once mobilized and pain controlled with oral medication.possibly even today. Follow up with me in 2 weeks.  VTE proph:aspirin twice daily and SCDs while in the hospital

## 2012-09-20 NOTE — Evaluation (Signed)
Physical Therapy Evaluation Patient Details Name: Katie Morton MRN: [id number] DOB: 1945/08/14 Today's Date: 09/20/2012 Time: 1308-6578 PT Time Calculation (min): 55 min  PT Assessment / Plan / Recommendation History of Present Illness   Katie Morton is a 67 y.o. female who was brought to the ED after she reports stumbling on uneven ground outside of her home and loses her balance and falling onto her left hip.   She report having increased pain following the fall and was not able to walk afterward.  Came into ED.  Now s/p L hip compression screww fixation.  Clinical Impression  Pt s/p L hip orif.  Pt mobilizing very well and has completed all activities to safely go home.  Getting HHPT for home safety eval and treat, just to cover all bases, due to pt likely going home POD #1    PT Assessment  Patient needs continued PT services    Follow Up Recommendations  Home health PT    Does the patient have the potential to tolerate intense rehabilitation      Barriers to Discharge        Equipment Recommendations  Rolling walker with 5" wheels    Recommendations for Other Services     Frequency      Precautions / Restrictions Precautions Precautions: None Restrictions Weight Bearing Restrictions: Yes LLE Weight Bearing: Weight bearing as tolerated   Pertinent Vitals/Pain       Mobility  Bed Mobility Bed Mobility: Supine to Sit;Sitting - Scoot to Edge of Bed Supine to Sit: 6: Modified independent (Device/Increase time) Sitting - Scoot to Edge of Bed: 6: Modified independent (Device/Increase time) Transfers Transfers: Sit to Stand;Stand to Sit Sit to Stand: 5: Supervision Stand to Sit: 5: Supervision Details for Transfer Assistance: reinforced safe technique Ambulation/Gait Ambulation/Gait Assistance: 6: Modified independent (Device/Increase time) Ambulation Distance (Feet): 250 Feet Assistive device: Rolling walker Ambulation/Gait Assistance Details: safe and  steady with RW Gait Pattern: Step-through pattern Stairs: Yes Stairs Assistance: 5: Supervision Stairs Assistance Details (indicate cue type and reason): practiced multiple methods--husband assisted Stair Management Technique: One rail Right;Step to pattern;Forwards;Backwards;With walker Number of Stairs: 6 Wheelchair Mobility Wheelchair Mobility: No    Exercises Total Joint Exercises Ankle Circles/Pumps: AROM;20 reps Heel Slides: AAROM;AROM;15 reps;Supine Hip ABduction/ADduction: AAROM;15 reps   PT Diagnosis:    PT Problem List: Decreased knowledge of use of DME;Decreased knowledge of precautions;Pain PT Treatment Interventions:       PT Goals(Current goals can be found in the care plan section)    Visit Information  Last PT Received On: 09/20/12 Assistance Needed: +1 History of Present Illness:  Katie Morton is a 67 y.o. female who was brought to the ED after she reports stumbling on uneven ground outside of her home and loses her balance and falling onto her left hip.   She report having increased pain following the fall and was not able to walk afterward.  Came into ED.  Now s/p L hip compression screww fixation.       Prior Functioning  Home Living Family/patient expects to be discharged to:: Private residence Living Arrangements: Spouse/significant other Available Help at Discharge: Family Type of Home: House Home Access: Stairs to enter Secretary/administrator of Steps: 5 Entrance Stairs-Rails: Right Home Layout: Two level;Bed/bath upstairs Alternate Level Stairs-Number of Steps: 12 Alternate Level Stairs-Rails: Right;Left Home Equipment: None Prior Function Level of Independence: Independent Communication Communication: No difficulties    Cognition  Cognition Arousal/Alertness: Awake/alert Behavior During Therapy: [facility] for  tasks assessed/performed Overall Cognitive Status: Within Functional Limits for tasks assessed    Extremity/Trunk Assessment Upper  Extremity Assessment Upper Extremity Assessment: Overall WFL for tasks assessed Lower Extremity Assessment Lower Extremity Assessment: Overall WFL for tasks assessed Cervical / Trunk Assessment Cervical / Trunk Assessment: Normal   Balance Balance Balance Assessed: No  End of Session PT - End of Session Activity Tolerance: Patient tolerated treatment well Patient left: in chair;with call bell/phone within reach;with family/visitor present Nurse Communication: Mobility status  GP     Aashka Salomone, Eliseo Gum 09/20/2012, 3:40 PM  09/20/2012  Salem Bing, PT [phone]  (pager)

## 2012-09-20 NOTE — Progress Notes (Signed)
UR COMPLETED  

## 2012-09-20 NOTE — H&P (Signed)
[facility] History and Physical  Katie Morton:[id number] DOB: 03-19-1945 DOA: 09/19/2012  Referring physician:  PCP: Nadean Corwin, MD  Specialists:   Chief Complaint: Left Hip Pain after Falling  HPI: Katie Morton is a 67 y.o. female who was brought to the ED after she reports stumbling on uneven ground outside of her home and loses her balance and falling onto her left hip.   She report having increased pain following the fall and was not able to walk afterward, so EMS was called. In the ED she was found to have an minimally displaced left femoral neck fracture on x-rays, and Orthopedics was consulted for evaluation.  She was seen and taken to the OR by Dr. Ave Filter.     Review of Systems: The patient denies anorexia, fever, chills, headaches, weight loss, vision loss, diplopia, dizziness, decreased hearing, rhinitis, hoarseness, chest pain, syncope, dyspnea on exertion, peripheral edema, balance deficits, cough, hemoptysis, abdominal pain, nausea, vomiting, diarrhea, constipation, hematemesis, melena, hematochezia, severe indigestion/heartburn, dysuria, hematuria, incontinence, muscle weakness, suspicious skin lesions, transient blindness, difficulty walking, depression, unusual weight change, abnormal bleeding, enlarged lymph nodes, angioedema, and breast masses.    Past Medical History  Diagnosis Date  . Arthritis   . COPD (chronic obstructive pulmonary disease)   . Osteoporosis     Past Surgical History  Procedure Laterality Date  . Tonsillectomy    . Mandible surgery      Prior to Admission medications   Medication Sig Start Date End Date Taking? Authorizing Provider  aspirin 81 MG tablet Take 81 mg by mouth daily.   Yes Historical Provider, MD  Calcium Carbonate-Vitamin D (CALCIUM + D PO) Take 1 tablet by mouth daily.   Yes Historical Provider, MD  Cholecalciferol (VITAMIN D PO) Take 1 tablet by mouth daily.   Yes Historical Provider, MD  CINNAMON  PO Take 1 capsule by mouth 2 (two) times daily.   Yes Historical Provider, MD  fish oil-omega-3 fatty acids 1000 MG capsule Take 2 g by mouth daily.   Yes Historical Provider, MD  Misc Natural Products (OSTEO BI-FLEX JOINT SHIELD PO) Take 1 tablet by mouth 2 (two) times daily.   Yes Historical Provider, MD  Multiple Vitamins-Minerals (MULTIVITAMIN WITH MINERALS) tablet Take 1 tablet by mouth daily.   Yes Historical Provider, MD  Multiple Vitamins-Minerals (PRESERVISION AREDS PO) Take 1 tablet by mouth daily.   Yes Historical Provider, MD     No Known Allergies     Social History:  reports that she has quit smoking. She does not have any smokeless tobacco history on file. She reports that  drinks alcohol. She reports that she does not use illicit drugs.     Family History  Problem Relation Age of Onset  . CAD Paternal Grandfather   . CAD Maternal Grandfather   . Hypertension Mother   . Cancer - Other Father     Leukemia and Melanoma     Physical Exam:  GEN:  Pleasant Elderly thin well developed 67 y.o. Caucasian female  examined  and in no acute distress; cooperative with exam Filed Vitals:   09/20/12 0030 09/20/12 0132 09/20/12 0300 09/20/12 0525  BP: 140/65 127/54 116/57 110/54  Pulse: 60 57 68 63  Temp: 98.5 F (36.9 C) 97.7 F (36.5 C) 98.3 F (36.8 C) 98.3 F (36.8 C)  TempSrc: Oral Oral Oral Oral  Resp: 16 15 16 18   Height:      Weight:      SpO2:  99% 98% 93%   Blood pressure 110/54, pulse 63, temperature 98.3 F (36.8 C), temperature source Oral, resp. rate 18, height 5\' 8"  (1.727 m), weight 54.432 kg (120 lb), SpO2 93.00%. PSYCH: She is alert and oriented x4; does not appear anxious does not appear depressed; affect is normal HEENT: Normocephalic and Atraumatic, Mucous membranes pink; PERRLA; EOM intact; Fundi:  Benign;  No scleral icterus, Nares: Patent, Oropharynx: Clear, Fair Dentition, Neck:  FROM, no cervical lymphadenopathy nor thyromegaly or carotid bruit;  no JVD; Breasts:: Not examined CHEST WALL: No tenderness CHEST: Normal respiration, clear to auscultation bilaterally HEART: Regular rate and rhythm; no murmurs rubs or gallops BACK: No kyphosis or scoliosis; no CVA tenderness ABDOMEN: Positive Bowel Sounds, soft non-tender; no masses, no organomegaly. Rectal Exam: Not done EXTREMITIES: No cyanosis, clubbing or edema; no ulcerations. Genitalia: not examined PULSES: 2+ and symmetric SKIN: Normal hydration no rash or ulceration CNS: Cranial nerves 2-12 grossly intact; No motor or sensory deficits    Labs on Admission:  Basic Metabolic Panel:  Recent Labs Lab 09/19/12 1832  NA 139  K 3.9  CL 101  CO2 27  GLUCOSE 122*  BUN 9  CREATININE 0.50  CALCIUM 9.2   Liver Function Tests: No results found for this basename: AST, ALT, ALKPHOS, BILITOT, PROT, ALBUMIN,  in the last 168 hours No results found for this basename: LIPASE, AMYLASE,  in the last 168 hours No results found for this basename: AMMONIA,  in the last 168 hours CBC:  Recent Labs Lab 09/19/12 1832  WBC 10.4  NEUTROABS 7.3  HGB 14.2  HCT 41.8  MCV 91.3  PLT 248   Cardiac Enzymes: No results found for this basename: CKTOTAL, CKMB, CKMBINDEX, TROPONINI,  in the last 168 hours  BNP (last 3 results) No results found for this basename: PROBNP,  in the last 8760 hours CBG: No results found for this basename: GLUCAP,  in the last 168 hours  Radiological Exams on Admission: Dg Chest 2 View  09/19/2012   *RADIOLOGY REPORT*  Clinical Data: Pain.  CHEST - 2 VIEW  Comparison: Chest x-ray 11/04/2010  Findings: The lungs are hyperinflated.  Heart size is normal. There are no focal consolidations or pleural effusions. Small linear density in the right lung apex appears stable, consistent with scar or atelectasis.  No pulmonary edema.  There is convex right scoliosis, unchanged in appearance.  IMPRESSION: No evidence for acute cardiopulmonary abnormality.   Original Report  Authenticated By: Norva Pavlov, M.D.   Dg Pelvis 1-2 Views  09/19/2012   *RADIOLOGY REPORT*  Clinical Data: Larey Seat on Sunday 09/17/2012.  Pain in the left hip.  PELVIS - 1-2 VIEW  Comparison: Views of the left hip 09/19/2012, views of the left hip 09/19/2012  Findings: There is lucency along the medial aspect of the left femoral neck, associated with deformity of the femoral neck. Findings are consistent with acute, minimally displaced fracture. The remainder the pelvis appears intact.  Regional bowel gas pattern is nonobstructive.  IMPRESSION: Left femoral neck fracture.   Original Report Authenticated By: Norva Pavlov, M.D.   Dg Hip Complete Left  09/19/2012   *RADIOLOGY REPORT*  Clinical Data: Larey Seat on Sunday 09/17/2012.  Left hip pain with weightbearing.  LEFT HIP - COMPLETE 2+ VIEW  Comparison: Pelvis 09/19/2012  Findings: There is a comminuted but minimally displaced fracture of the left hip involving the subcapital femoral neck region.  No evidence for dislocation.  Visualized portion of the pelvis is intact.  IMPRESSION:  Subcapital femoral neck fracture.  The findings were discussed with Loree Fee, P.A. on 09/19/2012 at 4:31 p.m.   Original Report Authenticated By: Norva Pavlov, M.D.   Dg Hip Operative Left  09/19/2012   *RADIOLOGY REPORT*  Clinical Data: ORIF left hip.  OPERATIVE LEFT HIP  Comparison: 09/19/2012.  Findings: Two projections of the left hip by intraoperative fluoroscopy show screw fixation of a femoral neck fracture.  No extra osseous extension of screw tips.  No evidence of new osseous fracture.  The hip joint is located.  IMPRESSION: Intraoperative fluoroscopy documenting left femoral neck fixation.   Original Report Authenticated By: Tiburcio Pea   Dg Pelvis Portable  09/19/2012   *RADIOLOGY REPORT*  Clinical Data: Postoperative radiographs.  Left hip ORIF.  PORTABLE PELVIS  Comparison: 09/19/2012.  Findings: ORIF of the left hip with three cannulated lag screws. No  complicating features.  Visualized pelvic rings appear normal.  IMPRESSION: Cannulated lag screw placement in the left hip.   Original Report Authenticated By: Andreas Newport, M.D.     Assessment/Plan Principal Problem:   Femur fracture Active Problems:   Osteoporosis, unspecified   Arthritis   COPD (chronic obstructive pulmonary disease)    1.  Femur Fracture- S/P Fall,  Had Arthroplasty/ ORIF of Left Hip by Orthopedic Surgeon Dr. Ave Filter.   Pain Control PRN with IV dilaudid.   Plan for mobilization and wound care per Ortho.     2.  Osteoporosis-  Continue Calcium and Vitamin D therapy.    3.  Arthritis-  Pain Control PRN, On OTC BioFlex at home.    4.  COPD- stable.     5.  DVT prophylaxis with Lovenox.       Code Status:   FULL CODE Family Communication:   No Family Present  Disposition Plan:    TBA  Time spent:  9 Minutes  Ron Parker [facility] Pager [phone]  If 7PM-7AM, please contact night-coverage [email or web address] Password [facility] 09/20/2012, 5:43 AM

## 2012-09-20 NOTE — Evaluation (Signed)
Occupational Therapy Evaluation Patient Details Name: Katie Morton MRN: [id number] DOB: 05-20-1945 Today's Date: 09/20/2012 Time: 6295-2841 OT Time Calculation (min): 19 min  OT Assessment / Plan / Recommendation History of present illness  Katie Morton is a 67 y.o. female who was brought to the ED after she reports stumbling on uneven ground outside of her home and loses her balance and falling onto her left hip.   She report having increased pain following the fall and was not able to walk afterward.  Came into ED.  Now s/p L hip compression screww fixation.   Clinical Impression   Patient evaluated by Occupational Therapy with no further acute OT needs identified. All education has been completed and the patient has no further questions. Pt demonstrates good safety awareness.  Currently, she requires min A for LB ADLs, but will progress quickly to modified independent. See below for any follow-up Occupational Therapy or equipment needs. OT is signing off. Thank you for this referral.     OT Assessment  Patient does not need any further OT services    Follow Up Recommendations  No OT follow up;Supervision - Intermittent    Barriers to Discharge      Equipment Recommendations  None recommended by OT    Recommendations for Other Services    Frequency       Precautions / Restrictions Precautions Precautions: None Restrictions Weight Bearing Restrictions: Yes LLE Weight Bearing: Weight bearing as tolerated   Pertinent Vitals/Pain     ADL  Eating/Feeding: Independent Where Assessed - Eating/Feeding: Chair Grooming: Wash/dry hands;Wash/dry face;Teeth care;Supervision/safety Where Assessed - Grooming: Supported standing Upper Body Bathing: Set up Where Assessed - Upper Body Bathing: Supported sitting;Unsupported sitting Lower Body Bathing: Minimal assistance Where Assessed - Lower Body Bathing: Supported sit to stand Upper Body Dressing: Set up Where Assessed - Upper  Body Dressing: Supported sitting;Unsupported sitting Lower Body Dressing: Minimal assistance Where Assessed - Lower Body Dressing: Supported sit to stand Toilet Transfer: Supervision/safety Statistician Method: Sit to stand;Stand pivot Acupuncturist: Grab bars;Comfort height toilet Toileting - Architect and Hygiene: Supervision/safety Where Assessed - Engineer, mining and Hygiene: Standing Tub/Shower Transfer: Therapist, sports Method: Science writer: Walk in Scientist, research (physical sciences) Used: Rolling walker Transfers/Ambulation Related to ADLs: supervision ADL Comments: Pt and spouse were instructed in options for shower seat and will use an outdoor chair with a rubber mat.  Pt able to doff Lt. sock, and required min A to don due to hip discomfort.  She willl have assist from spouse until able to perform independently.  All education completed    OT Diagnosis:    OT Problem List:   OT Treatment Interventions:     OT Goals(Current goals can be found in the care plan section)    Visit Information  Last OT Received On: 09/20/12 Assistance Needed: +1 History of Present Illness:  Katie Morton is a 67 y.o. female who was brought to the ED after she reports stumbling on uneven ground outside of her home and loses her balance and falling onto her left hip.   She report having increased pain following the fall and was not able to walk afterward.  Came into ED.  Now s/p L hip compression screww fixation.       Prior Functioning     Home Living Family/patient expects to be discharged to:: Private residence Living Arrangements: Spouse/significant other Available Help at Discharge: Family Type of Home: House Home Access: Stairs  to enter Entrance Stairs-Number of Steps: 5 Entrance Stairs-Rails: Right Home Layout: Two level;Bed/bath upstairs Alternate Level Stairs-Number of Steps: 12 Alternate Level  Stairs-Rails: Right;Left Home Equipment: None Prior Function Level of Independence: Independent Communication Communication: No difficulties         Vision/Perception     Cognition  Cognition Arousal/Alertness: Awake/alert Behavior During Therapy: WFL for tasks assessed/performed Overall Cognitive Status: Within Functional Limits for tasks assessed    Extremity/Trunk Assessment Upper Extremity Assessment Upper Extremity Assessment: Overall WFL for tasks assessed Cervical / Trunk Assessment Cervical / Trunk Assessment: Normal     Mobility Bed Mobility Bed Mobility: Not assessed Transfers Transfers: Sit to Stand;Stand to Sit Sit to Stand: 5: Supervision Stand to Sit: 5: Supervision     Exercise     Balance     End of Session OT - End of Session Equipment Utilized During Treatment: Rolling walker Activity Tolerance: Patient tolerated treatment well Patient left: in chair;with call bell/phone within reach;with family/visitor present  GO Functional Limitation: Self care Self Care Current Status (Z6109): At least 1 percent but less than 20 percent impaired, limited or restricted Self Care Goal Status (U0454): At least 1 percent but less than 20 percent impaired, limited or restricted Self Care Discharge Status [phone]): At least 1 percent but less than 20 percent impaired, limited or restricted   Moncerrath Berhe M 09/20/2012, 10:58 PM

## 2012-09-20 NOTE — Clinical Social Work Note (Signed)
Clinical Social Worker went by patient's room to discuss possible need for SNF placement. PT was already in room speaking with patient and reported that patient will be discharging home with home health needs. CSW will notify CM of home health needs and CSW will sign off, as social work intervention is no longer needed.   Rozetta Nunnery MSW, [employer] [phone]

## 2012-09-20 NOTE — Discharge Summary (Signed)
Physician Discharge Summary  Katie Morton ZOX:[id number] DOB: 03/20/1945 DOA: 09/19/2012  PCP: Katie Corwin, MD  Admit date: 09/19/2012 Discharge date: 09/20/2012  Time spent: >30 minutes  Recommendations for Outpatient Follow-up:  1. CBC to follow Hgb and BMET to follow electrolytes and kidney function.  Discharge Diagnoses:  Principal Problem:   Femur fracture Active Problems:   Osteoporosis, unspecified   COPD (chronic obstructive pulmonary disease)   Arthritis   Discharge Condition: stable and pain controlled. Will be discharge home with Home Health services for PT/OT and rolling walker. Follow up arranged with Orthopedic service in 2 weeks.  Diet recommendation: regular diet  Filed Weights   09/19/12 1752  Weight: 54.432 kg (120 lb)    History of present illness:  67 y.o. female who was brought to the ED after she reports stumbling on uneven ground outside of her home and loses her balance and falling onto her left hip. She report having increased pain following the fall and was not able to walk afterward, so EMS was called. In the ED she was found to have an minimally displaced left femoral neck fracture on x-rays.   Hospital Course:  1. Femur Fracture- S/P Fall, Had Arthroplasty/ ORIF of Left Hip by Orthopedic Surgeon Dr. Ave Morton.  -Pain Control PRN with PO vicodin -Home Health PT/OT for physical conditioning -rolling walker  2. Osteoporosis- Continue Calcium and Vitamin D therapy.  -follow up with PCP for further therapy/evaluation  3. Arthritis- Pain Control PRN, On OTC BioFlex at home. -will received pain meds from orthopedic service; if further pain management is required patient will follow with PCP.   4. COPD- stable.  -no SOB. Not on home medications.  5. DVT prophylaxis with full dose ASA for 3 weeks  GI prophylaxis: started on protonix.    Procedures: Left hip ORIF  Consultations:  Orthopedic service  Discharge Exam: Filed  Vitals:   09/20/12 1000 09/20/12 1115 09/20/12 1300 09/20/12 1506  BP: 140/64  125/64   Pulse: 68  64   Temp: 98.6 F (37 C)  98.6 F (37 C)   TempSrc: Oral  Oral   Resp: 18 16 18 18   Height:      Weight:      SpO2: 98% 98% 97% 97%    General: afebrile, NAD, well controlled hip pain Cardiovascular: S1 and S2, no MRG Respiratory: CTA bilaterally Abdomen: soft, NT, ND, positive BS Neuro: non focal   Discharge Instructions  Discharge Orders   Future Orders Complete By Expires     Discharge instructions  As directed     Comments:      Keep yourself well hydrated Take medications as prescribed Follow up with orthopedic service in 2 weeks    Face-to-face encounter (required for Medicare/Medicaid patients)  As directed     Comments:      I Katie Morton certify that this patient is under my care and that I, or a nurse practitioner or physician's assistant working with me, had a face-to-face encounter that meets the physician face-to-face encounter requirements with this patient on 09/20/2012. The encounter with the patient was in whole, or in part for the following medical condition(s) which is the primary reason for home health care (List medical condition): physical conditioning after acute left hip fracture; status post ORIF.    Questions:      The encounter with the patient was in whole, or in part, for the following medical condition, which is the primary reason for home health care:  physical conditioning s/p left hip fracture    I certify that, based on my findings, the following services are medically necessary home health services:  Physical therapy    My clinical findings support the need for the above services:  Unable to leave home safely without assistance and/or assistive device    Further, I certify that my clinical findings support that this patient is homebound due to:  Unable to leave home safely without assistance    Reason for Medically Necessary Home Health  Services:  Therapy- Investment banker, operational, Patent examiner    Therapy- Instruction on use of Assistive Device for Ambulation on all Surfaces    Home Health  As directed     Questions:      To provide the following care/treatments:  PT    OT    Increase activity slowly  As directed     Weight bearing as tolerated  As directed         Medication List    STOP taking these medications       aspirin 81 MG tablet  Replaced by:  aspirin EC 325 MG tablet      TAKE these medications       aspirin EC 325 MG tablet  Take 1 tablet (325 mg total) by mouth 2 (two) times daily. Treatment for 3 weeks; then resume daily baby ASA.     CALCIUM + D PO  Take 1 tablet by mouth daily.     CINNAMON PO  Take 1 capsule by mouth 2 (two) times daily.     fish oil-omega-3 fatty acids 1000 MG capsule  Take 2 g by mouth daily.     multivitamin with minerals tablet  Take 1 tablet by mouth daily.     PRESERVISION AREDS PO  Take 1 tablet by mouth daily.     OSTEO BI-FLEX JOINT SHIELD PO  Take 1 tablet by mouth 2 (two) times daily.     pantoprazole 40 MG tablet  Commonly known as:  PROTONIX  Take 1 tablet (40 mg total) by mouth daily before breakfast.  Start taking on:  09/21/2012     VITAMIN D PO  Take 1 tablet by mouth daily.       No Known Allergies     Follow-up Information   Follow up with Katie Paris, MD. Schedule an appointment as soon as possible for a visit in 2 weeks.   Contact information:   [address] [place] [address] [phone]        The results of significant diagnostics from this hospitalization (including imaging, microbiology, ancillary and laboratory) are listed below for reference.    Significant Diagnostic Studies: Dg Chest 2 View  09/19/2012   *RADIOLOGY REPORT*  Clinical Data: Pain.  CHEST - 2 VIEW  Comparison: Chest x-ray 11/04/2010  Findings: The lungs are hyperinflated.  Heart size is normal. There are no  focal consolidations or pleural effusions. Small linear density in the right lung apex appears stable, consistent with scar or atelectasis.  No pulmonary edema.  There is convex right scoliosis, unchanged in appearance.  IMPRESSION: No evidence for acute cardiopulmonary abnormality.   Original Report Authenticated By: Norva Pavlov, M.D.   Dg Pelvis 1-2 Views  09/19/2012   *RADIOLOGY REPORT*  Clinical Data: Larey Seat on Sunday 09/17/2012.  Pain in the left hip.  PELVIS - 1-2 VIEW  Comparison: Views of the left hip 09/19/2012, views of the left hip 09/19/2012  Findings:  There is lucency along the medial aspect of the left femoral neck, associated with deformity of the femoral neck. Findings are consistent with acute, minimally displaced fracture. The remainder the pelvis appears intact.  Regional bowel gas pattern is nonobstructive.  IMPRESSION: Left femoral neck fracture.   Original Report Authenticated By: Norva Pavlov, M.D.   Dg Hip Complete Left  09/19/2012   *RADIOLOGY REPORT*  Clinical Data: Larey Seat on Sunday 09/17/2012.  Left hip pain with weightbearing.  LEFT HIP - COMPLETE 2+ VIEW  Comparison: Pelvis 09/19/2012  Findings: There is a comminuted but minimally displaced fracture of the left hip involving the subcapital femoral neck region.  No evidence for dislocation.  Visualized portion of the pelvis is intact.  IMPRESSION: Subcapital femoral neck fracture.  The findings were discussed with Loree Fee, P.A. on 09/19/2012 at 4:31 p.m.   Original Report Authenticated By: Norva Pavlov, M.D.   Dg Hip Operative Left  09/19/2012   *RADIOLOGY REPORT*  Clinical Data: ORIF left hip.  OPERATIVE LEFT HIP  Comparison: 09/19/2012.  Findings: Two projections of the left hip by intraoperative fluoroscopy show screw fixation of a femoral neck fracture.  No extra osseous extension of screw tips.  No evidence of new osseous fracture.  The hip joint is located.  IMPRESSION: Intraoperative fluoroscopy documenting  left femoral neck fixation.   Original Report Authenticated By: Tiburcio Pea   Dg Pelvis Portable  09/19/2012   *RADIOLOGY REPORT*  Clinical Data: Postoperative radiographs.  Left hip ORIF.  PORTABLE PELVIS  Comparison: 09/19/2012.  Findings: ORIF of the left hip with three cannulated lag screws. No complicating features.  Visualized pelvic rings appear normal.  IMPRESSION: Cannulated lag screw placement in the left hip.   Original Report Authenticated By: Andreas Newport, M.D.   Labs: Basic Metabolic Panel:  Recent Labs Lab 09/19/12 1832 09/20/12 0510  NA 139 136  K 3.9 3.3*  CL 101 99  CO2 27 28  GLUCOSE 122* 103*  BUN 9 8  CREATININE 0.50 0.58  CALCIUM 9.2 8.2*   CBC:  Recent Labs Lab 09/19/12 1832 09/20/12 0510  WBC 10.4 10.2  NEUTROABS 7.3  --   HGB 14.2 11.8*  HCT 41.8 35.4*  MCV 91.3 91.7  PLT 248 200    Signed:  Brekyn Huntoon  [facility] 09/20/2012, 4:04 PM

## 2012-09-22 ENCOUNTER — Encounter (HOSPITAL_COMMUNITY): Payer: Self-pay | Admitting: Orthopedic Surgery

## 2012-12-30 ENCOUNTER — Encounter: Payer: Self-pay | Admitting: Internal Medicine

## 2012-12-30 DIAGNOSIS — R7309 Other abnormal glucose: Secondary | ICD-10-CM | POA: Insufficient documentation

## 2012-12-30 DIAGNOSIS — R7303 Prediabetes: Secondary | ICD-10-CM | POA: Insufficient documentation

## 2012-12-30 DIAGNOSIS — R030 Elevated blood-pressure reading, without diagnosis of hypertension: Secondary | ICD-10-CM | POA: Insufficient documentation

## 2012-12-30 DIAGNOSIS — E785 Hyperlipidemia, unspecified: Secondary | ICD-10-CM | POA: Insufficient documentation

## 2013-01-01 ENCOUNTER — Other Ambulatory Visit: Payer: Self-pay | Admitting: Internal Medicine

## 2013-01-01 DIAGNOSIS — Z1212 Encounter for screening for malignant neoplasm of rectum: Secondary | ICD-10-CM

## 2013-01-01 DIAGNOSIS — R7309 Other abnormal glucose: Secondary | ICD-10-CM

## 2013-01-01 DIAGNOSIS — E559 Vitamin D deficiency, unspecified: Secondary | ICD-10-CM | POA: Insufficient documentation

## 2013-01-01 DIAGNOSIS — Z79899 Other long term (current) drug therapy: Secondary | ICD-10-CM

## 2013-01-01 DIAGNOSIS — I10 Essential (primary) hypertension: Secondary | ICD-10-CM

## 2013-01-01 DIAGNOSIS — E782 Mixed hyperlipidemia: Secondary | ICD-10-CM

## 2013-01-02 ENCOUNTER — Ambulatory Visit: Payer: Medicare PPO | Admitting: Internal Medicine

## 2013-01-02 ENCOUNTER — Encounter: Payer: Self-pay | Admitting: Internal Medicine

## 2013-01-02 VITALS — BP 152/76 | HR 60 | Temp 97.5°F | Resp 16 | Ht 68.0 in | Wt 116.2 lb

## 2013-01-02 DIAGNOSIS — Z23 Encounter for immunization: Secondary | ICD-10-CM

## 2013-01-02 DIAGNOSIS — E559 Vitamin D deficiency, unspecified: Secondary | ICD-10-CM

## 2013-01-02 DIAGNOSIS — Z1212 Encounter for screening for malignant neoplasm of rectum: Secondary | ICD-10-CM

## 2013-01-02 DIAGNOSIS — R7309 Other abnormal glucose: Secondary | ICD-10-CM

## 2013-01-02 DIAGNOSIS — I10 Essential (primary) hypertension: Secondary | ICD-10-CM

## 2013-01-02 DIAGNOSIS — R0989 Other specified symptoms and signs involving the circulatory and respiratory systems: Secondary | ICD-10-CM

## 2013-01-02 DIAGNOSIS — E782 Mixed hyperlipidemia: Secondary | ICD-10-CM

## 2013-01-02 DIAGNOSIS — Z79899 Other long term (current) drug therapy: Secondary | ICD-10-CM

## 2013-01-02 LAB — HEPATIC FUNCTION PANEL
Alkaline Phosphatase: 70 U/L (ref 39–117)
Indirect Bilirubin: 0.5 mg/dL (ref 0.0–0.9)
Total Bilirubin: 0.6 mg/dL (ref 0.3–1.2)

## 2013-01-02 LAB — LIPID PANEL
HDL: 70 mg/dL (ref >39)
LDL Cholesterol: 136 mg/dL — ABNORMAL HIGH (ref 0–99)
Total CHOL/HDL Ratio: 3.3 Ratio
VLDL: 23 mg/dL (ref 0–40)

## 2013-01-02 LAB — CBC WITH DIFFERENTIAL/PLATELET
Basophils Absolute: 0.1 10*3/uL (ref 0.0–0.1)
HCT: 42.2 % (ref 36.0–46.0)
Lymphocytes Relative: 25 % (ref 12–46)
Lymphs Abs: 2.4 10*3/uL (ref 0.7–4.0)
Monocytes Absolute: 0.7 10*3/uL (ref 0.1–1.0)
Neutro Abs: 6 10*3/uL (ref 1.7–7.7)
RBC: 4.75 MIL/uL (ref 3.87–5.11)
RDW: 13.6 % (ref 11.5–15.5)
WBC: 9.7 10*3/uL (ref 4.0–10.5)

## 2013-01-02 LAB — BASIC METABOLIC PANEL WITH GFR
CO2: 28 mEq/L (ref 19–32)
Chloride: 100 mEq/L (ref 96–112)
GFR, Est Non African American: >89 mL/min
Potassium: 4.2 mEq/L (ref 3.5–5.3)

## 2013-01-02 LAB — HEMOGLOBIN A1C: Hgb A1c MFr Bld: 5.9 % — ABNORMAL HIGH (ref <5.7)

## 2013-01-02 LAB — TSH: TSH: 4.467 u[IU]/mL (ref 0.350–4.500)

## 2013-01-02 NOTE — Patient Instructions (Signed)
Continue meds / supplements same. Further disposition pending labs. Hypertension As your heart beats, it forces blood through your arteries. This force is your blood pressure. If the pressure is too high, it is called hypertension (HTN) or high blood pressure. HTN is dangerous because you may have it and not know it. High blood pressure may mean that your heart has to work harder to pump blood. Your arteries may be narrow or stiff. The extra work puts you at risk for heart disease, stroke, and other problems.  Blood pressure consists of two numbers, a higher number over a lower, 110/72, for example. It is stated as "110 over 72." The ideal is below 120 for the top number (systolic) and under 80 for the bottom (diastolic). Write down your blood pressure today. You should pay close attention to your blood pressure if you have certain conditions such as:  Heart failure.  Prior heart attack.  Diabetes  Chronic kidney disease.  Prior stroke.  Multiple risk factors for heart disease. To see if you have HTN, your blood pressure should be measured while you are seated with your arm held at the level of the heart. It should be measured at least twice. A one-time elevated blood pressure reading (especially in the Emergency Department) does not mean that you need treatment. There may be conditions in which the blood pressure is different between your right and left arms. It is important to see your caregiver soon for a recheck. Most people have essential hypertension which means that there is not a specific cause. This type of high blood pressure may be lowered by changing lifestyle factors such as:  Stress.  Smoking.  Lack of exercise.  Excessive weight.  Drug/tobacco/alcohol use.  Eating less salt. Most people do not have symptoms from high blood pressure until it has caused damage to the body. Effective treatment can often prevent, delay or reduce that damage. TREATMENT  When a cause has  been identified, treatment for high blood pressure is directed at the cause. There are a large number of medications to treat HTN. These fall into several categories, and your caregiver will help you select the medicines that are best for you. Medications may have side effects. You should review side effects with your caregiver. If your blood pressure stays high after you have made lifestyle changes or started on medicines,   Your medication(s) may need to be changed.  Other problems may need to be addressed.  Be certain you understand your prescriptions, and know how and when to take your medicine.  Be sure to follow up with your caregiver within the time frame advised (usually within two weeks) to have your blood pressure rechecked and to review your medications.  If you are taking more than one medicine to lower your blood pressure, make sure you know how and at what times they should be taken. Taking two medicines at the same time can result in blood pressure that is too low. SEEK IMMEDIATE MEDICAL CARE IF:  You develop a severe headache, blurred or changing vision, or confusion.  You have unusual weakness or numbness, or a faint feeling.  You have severe chest or abdominal pain, vomiting, or breathing problems. MAKE SURE YOU:   Understand these instructions.  Will watch your condition.  Will get help right away if you are not doing well or get worse. Document Released: 02/08/2005 Document Revised: 05/03/2011 Document Reviewed: 09/29/2007 [facility] Patient Information 2014 [place], [place]. Vitamin D Deficiency Vitamin D is an  important vitamin that your body needs. Having too little of it in your body is called a deficiency. A very bad deficiency can make your bones soft and can cause a condition called rickets.  Vitamin D is important to your body for different reasons, such as:   It helps your body absorb 2 minerals called calcium and phosphorus.  It helps make your bones  healthy.  It may prevent some diseases, such as diabetes and multiple sclerosis.  It helps your muscles and heart. You can get vitamin D in several ways. It is a natural part of some foods. The vitamin is also added to some dairy products and cereals. Some people take vitamin D supplements. Also, your body makes vitamin D when you are in the sun. It changes the sun's rays into a form of the vitamin that your body can use. CAUSES   Not eating enough foods that contain vitamin D.  Not getting enough sunlight.  Having certain digestive system diseases that make it hard to absorb vitamin D. These diseases include Crohn's disease, chronic pancreatitis, and cystic fibrosis.  Having a surgery in which part of the stomach or small intestine is removed.  Being obese. Fat cells pull vitamin D out of your blood. That means that obese people may not have enough vitamin D left in their blood and in other body tissues.  Having chronic kidney or liver disease. RISK FACTORS Risk factors are things that make you more likely to develop a vitamin D deficiency. They include:  Being older.  Not being able to get outside very much.  Living in a nursing home.  Having had broken bones.  Having weak or thin bones (osteoporosis).  Having a disease or condition that changes how your body absorbs vitamin D.  Having dark skin.  Some medicines such as seizure medicines or steroids.  Being overweight or obese. SYMPTOMS Mild cases of vitamin D deficiency may not have any symptoms. If you have a very bad case, symptoms may include:  Bone pain.  Muscle pain.  Falling often.  Broken bones caused by a minor injury, due to osteoporosis. DIAGNOSIS A blood test is the best way to tell if you have a vitamin D deficiency. TREATMENT Vitamin D deficiency can be treated in different ways. Treatment for vitamin D deficiency depends on what is causing it. Options include:  Taking vitamin D  supplements.  Taking a calcium supplement. Your caregiver will suggest what dose is best for you. HOME CARE INSTRUCTIONS  Take any supplements that your caregiver prescribes. Follow the directions carefully. Take only the suggested amount.  Have your blood tested 2 months after you start taking supplements.  Eat foods that contain vitamin D. Healthy choices include:  Fortified dairy products, cereals, or juices. Fortified means vitamin D has been added to the food. Check the label on the package to be sure.  Fatty fish like salmon or trout.  Eggs.  Oysters.  Do not use a tanning bed.  Keep your weight at a healthy level. Lose weight if you need to.  Keep all follow-up appointments. Your caregiver will need to perform blood tests to make sure your vitamin D deficiency is going away. SEEK MEDICAL CARE IF:  You have any questions about your treatment.  You continue to have symptoms of vitamin D deficiency.  You have nausea or vomiting.  You are constipated.  You feel confused.  You have severe abdominal or back pain. MAKE SURE YOU:  Understand these instructions.  Will watch your condition.  Will get help right away if you are not doing well or get worse. Document Released: 05/03/2011 Document Revised: 06/05/2012 Document Reviewed: 05/03/2011 [facility] Patient Information 2014 [place], [place].

## 2013-01-02 NOTE — Progress Notes (Signed)
Patient ID: Katie Morton, female   DOB: [birth year], 67 y.o.   MRN: [id number]   HPI Patient is a very nice 67 yo MWF who presents for complete physical.  Patient has hx/o labile HTN and reports   BP has been controlled at home usually ranging about 125-130/70. Initial BP today was 152/76 and rechecked at 131/63. Patient denies any cardiac Symptoms as chest pain, palpitations, shortness of breath, dizziness or ankle swelling.  Patient's hyperlipidemia is controlled with diet. Last cholesterol last visit was 194, HDL 62, triglycerides 85 , and LDL 62.   Patient has prediabetes/insulin resistance with last A1c  5.7% in August 2014 and October 2013. Patient denies reactive hypoglycemic symptoms, visual blurring, diabetic polys, or paresthesias.   Other problems include Vitamin D Deficiency for which patient is supplementing Vitamin D.      Medication List     aspirin EC 81 MG tablet  Take 81 mg by mouth daily.     CALCIUM + D PO  Take 1 tablet by mouth daily.     CINNAMON PO  Take 1,000 capsules by mouth 2 (two) times daily.     fish oil-omega-3 fatty acids 1000 MG capsule  Take 1 g by mouth daily.     Flaxseed Oil 1000 MG Caps  Take 1,000 capsules by mouth daily.     HYDROcodone-acetaminophen 5-325 MG per tablet  Commonly known as:  NORCO  Take 1 tablet by mouth every 6 (six) hours as needed for pain.     multivitamin with minerals tablet  Take 1 tablet by mouth daily.     PRESERVISION AREDS PO  Take 1 tablet by mouth daily.     OSTEO BI-FLEX JOINT SHIELD PO  Take 1 tablet by mouth 2 (two) times daily.     VITAMIN D PO  Take 2,000 tablets by mouth daily.        Health Maintenance  Topic Date Due  . Pneumococcal Polysaccharide Vaccine Age 56 And Over  06/08/2012  . Influenza Vaccine  09/22/2012  . Mammogram  03/16/2014  . Tetanus/tdap  02/23/2015  . Colonoscopy  02/23/2019  . Zostavax  Completed    No Known Allergies  Past Medical History  Diagnosis Date   . Arthritis   . COPD (chronic obstructive pulmonary disease)   . Osteoporosis   . Labile hypertension   . Hyperlipidemia   . Elevated hemoglobin A1c   . Villous adenoma 2008    Past Surgical History  Procedure Laterality Date  . Tonsillectomy    . Mandible surgery    . Hip pinning,cannulated Left 09/19/2012    Procedure: Percutaneous Screw Fixation Left Hip Fracture;  Surgeon: Mable Paris, MD;  [facility];  Service: Orthopedics;  Laterality: Left;    Family History  Problem Relation Age of Onset  . CAD Paternal Grandfather   . CAD Maternal Grandfather   . Hypertension Mother   . Diabetes Mother   . Mitral valve prolapse Mother   . Heart disease Mother   . Cancer - Other Father     Leukemia and Melanoma  . COPD Father   . Stroke Father   . Heart disease Brother   . Cancer Maternal Aunt     colon  . Cancer Paternal Aunt     colon    History   Social History  . Marital Status: Married    Spouse Name: N/A    Number of Children: N/A  . Years of Education: N/A  Occupational History  . Not on file.   Social History Main Topics  . Smoking status: Former Smoker    Quit date: 02/22/1978  . Smokeless tobacco: Not on file  . Alcohol Use: 7.0 oz/week    14 drink(s) per week  . Drug Use: No  . Sexual Activity: Not on file   Other Topics Concern  . Not on file   Social History Narrative  . No narrative on file    SYSTEMS REVIEW Constitutional: Denies fever, chills, weight loss/gain, headaches, insomnia, fatigue, night sweats, and change in appetite. Eyes: Denies redness, blurred vision, diplopia, discharge, itchy, watery eyes.  ENT: Denies discharge, congestion, post nasal drip, epistaxis, sore throat, earache, hearing loss, dental pain, Tinnitus, Vertigo, Sinus pain, snoring.  Cardio: Denies chest pain, palpitations, irregular heartbeat, syncope, dyspnea, diaphoresis, orthopnea, PND, claudication, edema Respiratory: denies cough, dyspnea, DOE,  pleurisy, hoarseness, laryngitis, wheezing.  Gastrointestinal: Denies dysphagia, heartburn, reflux, water brash, pain, cramps, nausea, vomiting, bloating, diarrhea, constipation, hematemesis, melena, hematochezia, jaundice, hemorrhoids Genitourinary: Denies dysuria, frequency, urgency, nocturia, hesitancy, discharge, hematuria, flank pain Musculoskeletal: Denies arthralgia, myalgia, stiffness, Jt. Swelling, pain, limp, and strain/sprain. Skin: Denies puritis, rash, hives, warts, acne, eczema, changing in skin lesion Neuro: Weakness, tremor, incoordination, spasms, paresthesia, pain Psychiatric: Denies confusion, memory loss, sensory loss Endocrine: Denies change in weight, skin, hair change, nocturia, and paresthesia, Diabetic Polys, visual blurring, hyper /hypo glycemic episodes.  Heme/Lymph: Excessive bleeding, bruising, enlarged lymph node   Physical Exam: Filed Vitals:   01/02/13 1045  BP: 152/76  Pulse: 60  Temp: 97.5 F (36.4 C)  Resp: 16   Body mass index is 17.67 kg/(m^2).  General Appearance: Well nourished, in no apparent distress. Eyes: PERRLA, EOMs, conjunctiva no swelling or erythema, normal fundi and vessels. Sinuses: No Frontal/maxillary tenderness ENT/Mouth: EACs Patent / TMs  nl. Nares clear without erythema, swelling, mucoid exudates. Good dentition. No erythema, swelling, or exudate on post pharynx. Tongue normal, non-obstructing. Tonsils not swollen or erythematous. Hearing normal.  Neck: Supple, thyroid normal. No bruits or JVD. Respiratory: Respiratory effort normal.  BS equal bilaterally without rales, rhonci, wheezing or stridor. Cardio: Heart sounds normal, regular rate and rhythm without murmurs, rubs or gallops. Peripheral pulses brisk and equal bilaterally, without edema. No aortic or femoral bruits. Chest: symmetric, with normal excursions and percussion. Breasts: Normal w/ abn. masses or tenderness Abdomen: Flat, soft, with bowl sounds. Nontender, no  guarding, rebound, hernias, masses, or organomegaly.  Lymphatics: Non tender without lymphadenopathy.  Genitourinary: Musculoskeletal: Full ROM all peripheral extremities, joint stability, 5/5 strength, and normal gait. Skin: Warm, dry without rashes, lesions, ecchymosis.  Neuro: Cranial nerves intact, reflexes equal bilaterally. Normal muscle tone, no cerebellar symptoms. Sensation intact.  Pysch: Awake and oriented X 3, normal affect, Insight and Judgment appropriate.   Assessment and Plan  1.Hypertension, labile - continue diet, exercise  2. Hyperlipidemia - continue low cholesterol diet, & exercise  3. Prediabetes- diet, exercise  4. Vitamin D Deficiency - Supplement as discussed   Plan routine screening labs, EKG, hemoccult, Aortoscan

## 2013-01-03 LAB — URINALYSIS, MICROSCOPIC ONLY
Casts: NONE SEEN
Crystals: NONE SEEN
Squamous Epithelial / LPF: NONE SEEN

## 2013-01-03 LAB — MICROALBUMIN / CREATININE URINE RATIO: Microalb Creat Ratio: 40.7 mg/g — ABNORMAL HIGH (ref 0.0–30.0)

## 2013-01-05 ENCOUNTER — Other Ambulatory Visit: Payer: Self-pay | Admitting: Internal Medicine

## 2013-01-05 DIAGNOSIS — I10 Essential (primary) hypertension: Secondary | ICD-10-CM

## 2013-05-03 ENCOUNTER — Other Ambulatory Visit (HOSPITAL_COMMUNITY): Payer: Self-pay | Admitting: Obstetrics and Gynecology

## 2013-05-03 DIAGNOSIS — Z1231 Encounter for screening mammogram for malignant neoplasm of breast: Secondary | ICD-10-CM

## 2013-05-22 ENCOUNTER — Ambulatory Visit (HOSPITAL_COMMUNITY)
Admission: RE | Admit: 2013-05-22 | Discharge: 2013-05-22 | Disposition: A | Discharge status: Home / Self Care | Payer: Medicare PPO | Source: Ambulatory Visit | Attending: Obstetrics and Gynecology | Admitting: Obstetrics and Gynecology

## 2013-05-22 DIAGNOSIS — Z1231 Encounter for screening mammogram for malignant neoplasm of breast: Secondary | ICD-10-CM | POA: Insufficient documentation

## 2013-06-26 ENCOUNTER — Ambulatory Visit (INDEPENDENT_AMBULATORY_CARE_PROVIDER_SITE_OTHER): Payer: Medicare PPO | Admitting: Physician Assistant

## 2013-06-26 ENCOUNTER — Encounter: Payer: Self-pay | Admitting: Physician Assistant

## 2013-06-26 VITALS — BP 116/60 | HR 80 | Temp 98.5°F | Resp 16 | Ht 68.0 in | Wt 116.0 lb

## 2013-06-26 DIAGNOSIS — IMO0002 Reserved for concepts with insufficient information to code with codable children: Secondary | ICD-10-CM

## 2013-06-26 MED ORDER — SULFAMETHOXAZOLE-TMP DS 800-160 MG PO TABS: ORAL_TABLET | ORAL | Status: DC

## 2013-06-26 NOTE — Progress Notes (Addendum)
   Subjective:    Patient ID: Katie Morton, female    DOB: March 22, 1945, 68 y.o.   MRN: [id number]  HPI 68 y.o. female with recent skin cancer removal 2 weeks ago presents with abscess/insect bite on left hand. Started on Thursday with small erythema an lateral right hand, this has progressed. Denies fever, chills, nausea, CP, SOB, no systemic symptoms.    Review of Systems  Constitutional: Negative.   HENT: Negative.   Respiratory: Negative.   Cardiovascular: Negative.   Gastrointestinal: Negative.   Genitourinary: Negative.   Musculoskeletal: Negative.   Skin: Positive for rash and wound.  Neurological: Negative.        Objective:   Physical Exam  Constitutional: She is oriented to person, place, and time. She appears well-developed and well-nourished. No distress.  HENT:  Head: Normocephalic and atraumatic.  Cardiovascular: Normal rate, regular rhythm and normal heart sounds.   Pulmonary/Chest: Effort normal and breath sounds normal.  Abdominal: Soft. Bowel sounds are normal.  Musculoskeletal: Normal range of motion.  Neurological: She is alert and oriented to person, place, and time.  Skin:  1.5 x 1.5 area of erythema, swelling, warm with white punctuate in center on right lateral hand near ulna. Not fluctuant, some swelling and erythema starting up her arm along medial forearm. No lymphadenopathy.         Assessment & Plan:  Abscess- Bactrim DS BID for 10 days and once a day for 5 days.  Warm wet compresses, call if any worsening symptoms  Husband is here for a visit and states that the abscess is worse with progression to her forearm, will switch ABX and give prednisone. If not better will return to the office for I&D and Xray

## 2013-06-26 NOTE — Patient Instructions (Addendum)
Abscess An abscess is an infected area that contains a collection of pus and debris.It can occur in almost any part of the body. An abscess is also known as a furuncle or boil. CAUSES  An abscess occurs when tissue gets infected. This can occur from blockage of oil or sweat glands, infection of hair follicles, or a minor injury to the skin. As the body tries to fight the infection, pus collects in the area and creates pressure under the skin. This pressure causes pain. People with weakened immune systems have difficulty fighting infections and get certain abscesses more often.  SYMPTOMS Usually an abscess develops on the skin and becomes a painful mass that is red, warm, and tender. If the abscess forms under the skin, you may feel a moveable soft area under the skin. Some abscesses break open (rupture) on their own, but most will continue to get worse without care. The infection can spread deeper into the body and eventually into the bloodstream, causing you to feel ill.  DIAGNOSIS  Your caregiver will take your medical history and perform a physical exam. A sample of fluid may also be taken from the abscess to determine what is causing your infection. TREATMENT  Your caregiver may prescribe antibiotic medicines to fight the infection. However, taking antibiotics alone usually does not cure an abscess. Your caregiver may need to make a small cut (incision) in the abscess to drain the pus. In some cases, gauze is packed into the abscess to reduce pain and to continue draining the area. HOME CARE INSTRUCTIONS   Only take over-the-counter or prescription medicines for pain, discomfort, or fever as directed by your caregiver.  If you were prescribed antibiotics, take them as directed. Finish them even if you start to feel better.  If gauze is used, follow your caregiver's directions for changing the gauze.  To avoid spreading the infection:  Keep your draining abscess covered with a  bandage.  Wash your hands well.  Do not share personal care items, towels, or whirlpools with others.  Avoid skin contact with others.  Keep your skin and clothes clean around the abscess.  Keep all follow-up appointments as directed by your caregiver. SEEK MEDICAL CARE IF:   You have increased pain, swelling, redness, fluid drainage, or bleeding.  You have muscle aches, chills, or a general ill feeling.  You have a fever. MAKE SURE YOU:   Understand these instructions.  Will watch your condition.  Will get help right away if you are not doing well or get worse. Document Released: 11/18/2004 Document Revised: 08/10/2011 Document Reviewed: 04/23/2011 [facility] Patient Information 2014 [place].  Sulfamethoxazole; Trimethoprim, SMX-TMP tablets What is this medicine? SULFAMETHOXAZOLE; TRIMETHOPRIM or SMX-TMP (suhl fuh meth OK suh zohl; trye METH oh prim) is a combination of a sulfonamide antibiotic and a second antibiotic, trimethoprim. It is used to treat or prevent certain kinds of bacterial infections. It will not work for colds, flu, or other viral infections. This medicine may be used for other purposes; ask your health care provider or pharmacist if you have questions. COMMON BRAND NAME(S): Bacter-Aid DS , Bactrim DS, Bactrim, Septra DS, Septra What should I tell my health care provider before I take this medicine? They need to know if you have any of these conditions: -anemia -asthma -being treated with anticonvulsants -if you frequently drink alcohol containing drinks -kidney disease -liver disease -low level of folic acid or [id number] dehydrogenase -poor nutrition or malabsorption -porphyria -severe allergies -thyroid disorder -an unusual  or allergic reaction to sulfamethoxazole, trimethoprim, sulfa drugs, other medicines, foods, dyes, or preservatives -pregnant or trying to get pregnant -breast-feeding How should I use this medicine? Take this  medicine by mouth with a full glass of water. Follow the directions on the prescription label. Take your medicine at regular intervals. Do not take it more often than directed. Do not skip doses or stop your medicine early. Talk to your pediatrician regarding the use of this medicine in children. Special care may be needed. This medicine has been used in children as young as 73 months of age. Overdosage: If you think you have taken too much of this medicine contact a poison control center or emergency room at once. NOTE: This medicine is only for you. Do not share this medicine with others. What if I miss a dose? If you miss a dose, take it as soon as you can. If it is almost time for your next dose, take only that dose. Do not take double or extra doses. What may interact with this medicine? Do not take this medicine with any of the following medications: -aminobenzoate potassium -dofetilide -metronidazole This medicine may also interact with the following medications: -ACE inhibitors like benazepril, enalapril, lisinopril, and ramipril -cyclosporine -digoxin -diuretics -indomethacin -medicines for diabetes -methenamine -methotrexate -phenytoin -potassium supplements -pyrimethamine -sulfinpyrazone -tricyclic antidepressants -warfarin This list may not describe all possible interactions. Give your health care provider a list of all the medicines, herbs, non-prescription drugs, or dietary supplements you use. Also tell them if you smoke, drink alcohol, or use illegal drugs. Some items may interact with your medicine. What should I watch for while using this medicine? Tell your doctor or health care professional if your symptoms do not improve. Drink several glasses of water a day to reduce the risk of kidney problems. Do not treat diarrhea with over the counter products. Contact your doctor if you have diarrhea that lasts more than 2 days or if it is severe and watery. This medicine can  make you more sensitive to the sun. Keep out of the sun. If you cannot avoid being in the sun, wear protective clothing and use a sunscreen. Do not use sun lamps or tanning beds/booths. What side effects may I notice from receiving this medicine? Side effects that you should report to your doctor or health care professional as soon as possible: -allergic reactions like skin rash or hives, swelling of the face, lips, or tongue -breathing problems -fever or chills, sore throat -irregular heartbeat, chest pain -joint or muscle pain -pain or difficulty passing urine -red pinpoint spots on skin -redness, blistering, peeling or loosening of the skin, including inside the mouth -unusual bleeding or bruising -unusually weak or tired -yellowing of the eyes or skin Side effects that usually do not require medical attention (report to your doctor or health care professional if they continue or are bothersome): -diarrhea -dizziness -headache -loss of appetite -nausea, vomiting -nervousness This list may not describe all possible side effects. Call your doctor for medical advice about side effects. You may report side effects to FDA at 1-800-FDA-1088. Where should I keep my medicine? Keep out of the reach of children. Store at room temperature between 20 to 25 degrees C (68 to 77 degrees F). Protect from light. Throw away any unused medicine after the expiration date. NOTE: This sheet is a summary. It may not cover all possible information. If you have questions about this medicine, talk to your doctor, pharmacist, or health care provider.  2014, [employer]. (2007-10-11 11:32:51)

## 2013-06-27 MED ORDER — DOXYCYCLINE HYCLATE 100 MG PO TABS: 100.0000 mg | ORAL_TABLET | Freq: Two times a day (BID) | ORAL | Status: DC

## 2013-06-27 MED ORDER — PREDNISONE 20 MG PO TABS: ORAL_TABLET | ORAL | Status: DC

## 2013-06-27 NOTE — Addendum Note (Signed)
Addended by: Vicie Mutters R on: 06/27/2013 09:35 AM   Modules accepted: Orders

## 2013-06-28 ENCOUNTER — Ambulatory Visit (INDEPENDENT_AMBULATORY_CARE_PROVIDER_SITE_OTHER): Payer: Medicare PPO | Admitting: Physician Assistant

## 2013-06-28 ENCOUNTER — Encounter: Payer: Self-pay | Admitting: Physician Assistant

## 2013-06-28 ENCOUNTER — Ambulatory Visit: Payer: Self-pay | Admitting: Internal Medicine

## 2013-06-28 VITALS — BP 120/60 | HR 72 | Temp 98.2°F | Resp 16 | Ht 68.0 in | Wt 116.0 lb

## 2013-06-28 DIAGNOSIS — IMO0002 Reserved for concepts with insufficient information to code with codable children: Secondary | ICD-10-CM

## 2013-06-28 NOTE — Progress Notes (Signed)
   Subjective:    Patient ID: Katie Morton, female    DOB: December 06, 1945, 68 y.o.   MRN: [id number]  HPI 68 y.o. female seen recently for an abscess states the area is getting larger and more erythema.  Started on Thursday with small erythema an lateral right hand, this has progressed to swelling and redness to her hand and forearm. She has been using warm wet compresses and the abscess has started draining. Denies fever, chills, nausea, CP, SOB, no systemic symptoms.    Review of Systems  Constitutional: Negative.   HENT: Negative.   Respiratory: Negative.   Cardiovascular: Negative.   Gastrointestinal: Negative.   Genitourinary: Negative.   Musculoskeletal: Negative.   Skin: Positive for rash and wound.  Neurological: Negative.        Objective:   Physical Exam  Constitutional: She is oriented to person, place, and time. She appears well-developed and well-nourished. No distress.  HENT:  Head: Normocephalic and atraumatic.  Cardiovascular: Normal rate, regular rhythm and normal heart sounds.   Pulmonary/Chest: Effort normal and breath sounds normal.  Abdominal: Soft. Bowel sounds are normal.  Musculoskeletal: Normal range of motion.  Neurological: She is alert and oriented to person, place, and time.  Skin:  1.6 x 1.6 area of erythema, swelling, warmth with white punctuate with a serosanguinous discharge on right lateral hand near ulna. Fluctuant and very ertyematous with some swelling and erythema starting up her arm along medial forearm but unchanged from 2 days ago. No lymphadenopathy.        Assessment & Plan:  Abscess- Doxycycline BID for 10 days Abscess was numbed with 0.5cc lidocaine 1% no epi, 11 blade was used to lance the abscess with pus and serosanguinous fluid expressed, area was packed and dressed. She will follow up on Monday.  Wound culture sent off.

## 2013-06-28 NOTE — Patient Instructions (Signed)
Abscess An abscess is an infected area that contains a collection of pus and debris.It can occur in almost any part of the body. An abscess is also known as a furuncle or boil. CAUSES  An abscess occurs when tissue gets infected. This can occur from blockage of oil or sweat glands, infection of hair follicles, or a minor injury to the skin. As the body tries to fight the infection, pus collects in the area and creates pressure under the skin. This pressure causes pain. People with weakened immune systems have difficulty fighting infections and get certain abscesses more often.  SYMPTOMS Usually an abscess develops on the skin and becomes a painful mass that is red, warm, and tender. If the abscess forms under the skin, you may feel a moveable soft area under the skin. Some abscesses break open (rupture) on their own, but most will continue to get worse without care. The infection can spread deeper into the body and eventually into the bloodstream, causing you to feel ill.  DIAGNOSIS  Your caregiver will take your medical history and perform a physical exam. A sample of fluid may also be taken from the abscess to determine what is causing your infection. TREATMENT  Your caregiver may prescribe antibiotic medicines to fight the infection. However, taking antibiotics alone usually does not cure an abscess. Your caregiver may need to make a small cut (incision) in the abscess to drain the pus. In some cases, gauze is packed into the abscess to reduce pain and to continue draining the area. HOME CARE INSTRUCTIONS   Only take over-the-counter or prescription medicines for pain, discomfort, or fever as directed by your caregiver.  If you were prescribed antibiotics, take them as directed. Finish them even if you start to feel better.  If gauze is used, follow your caregiver's directions for changing the gauze.  To avoid spreading the infection:  Keep your draining abscess covered with a  bandage.  Wash your hands well.  Do not share personal care items, towels, or whirlpools with others.  Avoid skin contact with others.  Keep your skin and clothes clean around the abscess.  Keep all follow-up appointments as directed by your caregiver. SEEK MEDICAL CARE IF:   You have increased pain, swelling, redness, fluid drainage, or bleeding.  You have muscle aches, chills, or a general ill feeling.  You have a fever. MAKE SURE YOU:   Understand these instructions.  Will watch your condition.  Will get help right away if you are not doing well or get worse. Document Released: 11/18/2004 Document Revised: 08/10/2011 Document Reviewed: 04/23/2011 [employer] Patient Information 2014 [employer].  

## 2013-07-01 LAB — WOUND CULTURE
GRAM STAIN: NONE SEEN
Gram Stain: NONE SEEN

## 2013-07-03 ENCOUNTER — Ambulatory Visit (INDEPENDENT_AMBULATORY_CARE_PROVIDER_SITE_OTHER): Payer: Medicare PPO | Admitting: Physician Assistant

## 2013-07-03 ENCOUNTER — Encounter: Payer: Self-pay | Admitting: Physician Assistant

## 2013-07-03 VITALS — BP 118/64 | HR 56 | Temp 98.2°F | Resp 16 | Wt 116.0 lb

## 2013-07-03 DIAGNOSIS — IMO0002 Reserved for concepts with insufficient information to code with codable children: Secondary | ICD-10-CM

## 2013-07-03 NOTE — Progress Notes (Signed)
   Subjective:    Patient ID: ERIKA SLABY, female    DOB: 16-Mar-1945, 68 y.o.   MRN: [id number]  HPI 68 y.o. female seen recently for an abscess , it was lanced and started on Doxy on Friday. It is doing better.  Denies fever, chills, nausea, CP, SOB, no systemic symptoms.   Review of Systems  Constitutional: Negative.   HENT: Negative.   Respiratory: Negative.   Cardiovascular: Negative.   Gastrointestinal: Negative.   Genitourinary: Negative.   Musculoskeletal: Negative.   Skin: Positive for rash and wound.  Neurological: Negative.        Objective:   Physical Exam  Constitutional: She is oriented to person, place, and time. She appears well-developed and well-nourished. No distress.  HENT:  Head: Normocephalic and atraumatic.  Cardiovascular: Normal rate, regular rhythm and normal heart sounds.   Pulmonary/Chest: Effort normal and breath sounds normal.  Abdominal: Soft. Bowel sounds are normal.  Musculoskeletal: Normal range of motion.  Neurological: She is alert and oriented to person, place, and time.  Skin:  1 x1 cm non fluctuant nodule, with small nonhealing ulceration, some erythema and tenderness but no discharge at this time. No lymphadenopathy.      Assessment & Plan:  Abscess- finsih ABX Soap, water, vaseline, and can do betadine/sugar solution.

## 2013-07-03 NOTE — Patient Instructions (Signed)
Vaseline for 2 days and wash with warm soap and water once a day and then if it is not healing well do a betadine/table sugar paste and put it on at night, cover it and then you can wash it off in the morning.   Abscess An abscess is an infected area that contains a collection of pus and debris.It can occur in almost any part of the body. An abscess is also known as a furuncle or boil. CAUSES  An abscess occurs when tissue gets infected. This can occur from blockage of oil or sweat glands, infection of hair follicles, or a minor injury to the skin. As the body tries to fight the infection, pus collects in the area and creates pressure under the skin. This pressure causes pain. People with weakened immune systems have difficulty fighting infections and get certain abscesses more often.  SYMPTOMS Usually an abscess develops on the skin and becomes a painful mass that is red, warm, and tender. If the abscess forms under the skin, you may feel a moveable soft area under the skin. Some abscesses break open (rupture) on their own, but most will continue to get worse without care. The infection can spread deeper into the body and eventually into the bloodstream, causing you to feel ill.  DIAGNOSIS  Your caregiver will take your medical history and perform a physical exam. A sample of fluid may also be taken from the abscess to determine what is causing your infection. TREATMENT  Your caregiver may prescribe antibiotic medicines to fight the infection. However, taking antibiotics alone usually does not cure an abscess. Your caregiver may need to make a small cut (incision) in the abscess to drain the pus. In some cases, gauze is packed into the abscess to reduce pain and to continue draining the area. HOME CARE INSTRUCTIONS   Only take over-the-counter or prescription medicines for pain, discomfort, or fever as directed by your caregiver.  If you were prescribed antibiotics, take them as directed. Finish  them even if you start to feel better.  If gauze is used, follow your caregiver's directions for changing the gauze.  To avoid spreading the infection:  Keep your draining abscess covered with a bandage.  Wash your hands well.  Do not share personal care items, towels, or whirlpools with others.  Avoid skin contact with others.  Keep your skin and clothes clean around the abscess.  Keep all follow-up appointments as directed by your caregiver. SEEK MEDICAL CARE IF:   You have increased pain, swelling, redness, fluid drainage, or bleeding.  You have muscle aches, chills, or a general ill feeling.  You have a fever. MAKE SURE YOU:   Understand these instructions.  Will watch your condition.  Will get help right away if you are not doing well or get worse. Document Released: 11/18/2004 Document Revised: 08/10/2011 Document Reviewed: 04/23/2011 [facility] Patient Information 2014 [place].

## 2014-01-06 NOTE — Patient Instructions (Signed)
Recommend the book "The END of DIETING" by Dr Baker Janus   and the book "The END of DIABETES " by Dr Excell Seltzer  At [email or web address] - get book & Audio CD's      Being diabetic has a  300% increased risk for heart attack, stroke, cancer, and alzheimer- type vascular dementia. It is very important that you work harder with diet by avoiding all foods that are white except chicken & fish. Avoid white rice (brown & wild rice is OK), white potatoes (sweetpotatoes in moderation is OK), White bread or wheat bread or anything made out of white flour like bagels, donuts, rolls, buns, biscuits, cakes, pastries, cookies, pizza crust, and pasta (made from white flour & egg whites) - vegetarian pasta or spinach or wheat pasta is OK. Multigrain breads like Arnold's or [employer], or multigrain sandwich thins or flatbreads.  Diet, exercise and weight loss can reverse and cure diabetes in the early stages.  Diet, exercise and weight loss is very important in the control and prevention of complications of diabetes which affects every system in your body, ie. Brain - dementia/stroke, eyes - glaucoma/blindness, heart - heart attack/heart failure, kidneys - dialysis, stomach - gastric paralysis, intestines - malabsorption, nerves - severe painful neuritis, circulation - gangrene & loss of a leg(s), and finally cancer and Alzheimers.    I recommend avoid fried & greasy foods,  sweets/candy, white rice (brown or wild rice or Quinoa is OK), white potatoes (sweet potatoes are OK) - anything made from white flour - bagels, doughnuts, rolls, buns, biscuits,white and wheat breads, pizza crust and traditional pasta made of white flour & egg white(vegetarian pasta or spinach or wheat pasta is OK).  Multi-grain bread is OK - like multi-grain flat bread or sandwich thins. Avoid alcohol in excess. Exercise is also important.    Eat all the vegetables you want - avoid meat, especially red meat and dairy - especially cheese.  Cheese  is the most concentrated form of trans-fats which is the worst thing to clog up our arteries. Veggie cheese is OK which can be found in the fresh produce section at [employer] or [employer] or [employer]  Preventive Care for Adults A healthy lifestyle and preventive care can promote health and wellness. Preventive health guidelines for women include the following key practices.  A routine yearly physical is a good way to check with your health care provider about your health and preventive screening. It is a chance to share any concerns and updates on your health and to receive a thorough exam.  Visit your dentist for a routine exam and preventive care every 6 months. Brush your teeth twice a day and floss once a day. Good oral hygiene prevents tooth decay and gum disease.  The frequency of eye exams is based on your age, health, family medical history, use of contact lenses, and other factors. Follow your health care provider's recommendations for frequency of eye exams.  Eat a healthy diet. Foods like vegetables, fruits, whole grains, low-fat dairy products, and lean protein foods contain the nutrients you need without too many calories. Decrease your intake of foods high in solid fats, added sugars, and salt. Eat the right amount of calories for you.Get information about a proper diet from your health care provider, if necessary.  Regular physical exercise is one of the most important things you can do for your health. Most adults should get at least 150 minutes of moderate-intensity exercise (any activity that increases  your heart rate and causes you to sweat) each week. In addition, most adults need muscle-strengthening exercises on 2 or more days a week.  Maintain a healthy weight. The body mass index (BMI) is a screening tool to identify possible weight problems. It provides an estimate of body fat based on height and weight. Your health care provider can find your BMI and can help you  achieve or maintain a healthy weight.For adults 20 years and older:  A BMI below 18.5 is considered underweight.  A BMI of 18.5 to 24.9 is normal.  A BMI of 25 to 29.9 is considered overweight.  A BMI of 30 and above is considered obese.  Maintain normal blood lipids and cholesterol levels by exercising and minimizing your intake of saturated fat. Eat a balanced diet with plenty of fruit and vegetables. If your lipid or cholesterol levels are high, you are over 50, or you are at high risk for heart disease, you may need your cholesterol levels checked more frequently.Ongoing high lipid and cholesterol levels should be treated with medicines if diet and exercise are not working.  If you smoke, find out from your health care provider how to quit. If you do not use tobacco, do not start.  Lung cancer screening is recommended for adults aged 55-80 years who are at high risk for developing lung cancer because of a history of smoking. A yearly low-dose CT scan of the lungs is recommended for people who have at least a 30-pack-year history of smoking and are a current smoker or have quit within the past 15 years. A pack year of smoking is smoking an average of 1 pack of cigarettes a day for 1 year (for example: 1 pack a day for 30 years or 2 packs a day for 15 years). Yearly screening should continue until the smoker has stopped smoking for at least 15 years. Yearly screening should be stopped for people who develop a health problem that would prevent them from having lung cancer treatment.  Avoid use of street drugs. Do not share needles with anyone. Ask for help if you need support or instructions about stopping the use of drugs.  High blood pressure causes heart disease and increases the risk of stroke.  Ongoing high blood pressure should be treated with medicines if weight loss and exercise do not work.  If you are 55-79 years old, ask your health care provider if you should take aspirin to  prevent strokes.  Diabetes screening involves taking a blood sample to check your fasting blood sugar level. This should be done once every 3 years, after age 45, if you are within normal weight and without risk factors for diabetes. Testing should be considered at a younger age or be carried out more frequently if you are overweight and have at least 1 risk factor for diabetes.  Breast cancer screening is essential preventive care for women. You should practice "breast self-awareness." This means understanding the normal appearance and feel of your breasts and may include breast self-examination. Any changes detected, no matter how small, should be reported to a health care provider. Women in their 20s and 30s should have a clinical breast exam (CBE) by a health care provider as part of a regular health exam every 1 to 3 years. After age 40, women should have a CBE every year. Starting at age 40, women should consider having a mammogram (breast X-ray test) every year. Women who have a family history of breast cancer should   talk to their health care provider about genetic screening. Women at a high risk of breast cancer should talk to their health care providers about having an MRI and a mammogram every year.  Breast cancer gene (BRCA)-related cancer risk assessment is recommended for women who have family members with BRCA-related cancers. BRCA-related cancers include breast, ovarian, tubal, and peritoneal cancers. Having family members with these cancers may be associated with an increased risk for harmful changes (mutations) in the breast cancer genes BRCA1 and BRCA2. Results of the assessment will determine the need for genetic counseling and BRCA1 and BRCA2 testing.  Routine pelvic exams to screen for cancer are no longer recommended for nonpregnant women who are considered low risk for cancer of the pelvic organs (ovaries, uterus, and vagina) and who do not have symptoms. Ask your health care provider  if a screening pelvic exam is right for you.  If you have had past treatment for cervical cancer or a condition that could lead to cancer, you need Pap tests and screening for cancer for at least 20 years after your treatment. If Pap tests have been discontinued, your risk factors (such as having a new sexual partner) need to be reassessed to determine if screening should be resumed. Some women have medical problems that increase the chance of getting cervical cancer. In these cases, your health care provider may recommend more frequent screening and Pap tests.    Colorectal cancer can be detected and often prevented. Most routine colorectal cancer screening begins at the age of 90 years and continues through age 8 years. However, your health care provider may recommend screening at an earlier age if you have risk factors for colon cancer. On a yearly basis, your health care provider may provide home test kits to check for hidden blood in the stool. Use of a small camera at the end of a tube, to directly examine the colon (sigmoidoscopy or colonoscopy), can detect the earliest forms of colorectal cancer. Talk to your health care provider about this at age 86, when routine screening begins. Direct exam of the colon should be repeated every 5-10 years through age 32 years, unless early forms of pre-cancerous polyps or small growths are found.  Osteoporosis is a disease in which the bones lose minerals and strength with aging. This can result in serious bone fractures or breaks. The risk of osteoporosis can be identified using a bone density scan. Women ages 75 years and over and women at risk for fractures or osteoporosis should discuss screening with their health care providers. Ask your health care provider whether you should take a calcium supplement or vitamin D to reduce the rate of osteoporosis.  Menopause can be associated with physical symptoms and risks. Hormone replacement therapy is available to  decrease symptoms and risks. You should talk to your health care provider about whether hormone replacement therapy is right for you.  Use sunscreen. Apply sunscreen liberally and repeatedly throughout the day. You should seek shade when your shadow is shorter than you. Protect yourself by wearing long sleeves, pants, a wide-brimmed hat, and sunglasses year round, whenever you are outdoors.  Once a month, do a whole body skin exam, using a mirror to look at the skin on your back. Tell your health care provider of new moles, moles that have irregular borders, moles that are larger than a pencil eraser, or moles that have changed in shape or color.  Stay current with required vaccines (immunizations).  Influenza vaccine. All adults  should be immunized every year.  Tetanus, diphtheria, and acellular pertussis (Td, Tdap) vaccine. Pregnant women should receive 1 dose of Tdap vaccine during each pregnancy. The dose should be obtained regardless of the length of time since the last dose. Immunization is preferred during the 27th-36th week of gestation. An adult who has not previously received Tdap or who does not know her vaccine status should receive 1 dose of Tdap. This initial dose should be followed by tetanus and diphtheria toxoids (Td) booster doses every 10 years. Adults with an unknown or incomplete history of completing a 3-dose immunization series with Td-containing vaccines should begin or complete a primary immunization series including a Tdap dose. Adults should receive a Td booster every 10 years.    Zoster vaccine. One dose is recommended for adults aged 65 years or older unless certain conditions are present.    PREVNAR - Pneumococcal 13-valent conjugate (PCV13) vaccine. When indicated, a person who is uncertain of her immunization history and has no record of immunization should receive the PCV13 vaccine. An adult aged 71 years or older who has certain medical conditions and has not been  previously immunized should receive 1 dose of PCV13 vaccine. This PCV13 should be followed with a dose of pneumococcal polysaccharide (PPSV23) vaccine. The PPSV23 vaccine dose should be obtained at least 8 weeks after the dose of PCV13 vaccine. An adult aged 78 years or older who has certain medical conditions and previously received 1 or more doses of PPSV23 vaccine should receive 1 dose of PCV13. The PCV13 vaccine dose should be obtained 1 or more years after the last PPSV23 vaccine dose.    PNEUMOVAX - Pneumococcal polysaccharide (PPSV23) vaccine. When PCV13 is also indicated, PCV13 should be obtained first. All adults aged 35 years and older should be immunized. An adult younger than age 33 years who has certain medical conditions should be immunized. Any person who resides in a nursing home or long-term care facility should be immunized. An adult smoker should be immunized. People with an immunocompromised condition and certain other conditions should receive both PCV13 and PPSV23 vaccines. People with human immunodeficiency virus (HIV) infection should be immunized as soon as possible after diagnosis. Immunization during chemotherapy or radiation therapy should be avoided. Routine use of PPSV23 vaccine is not recommended for American Indians, [place]atives, or people younger than 65 years unless there are medical conditions that require PPSV23 vaccine. When indicated, people who have unknown immunization and have no record of immunization should receive PPSV23 vaccine. One-time revaccination 5 years after the first dose of PPSV23 is recommended for people aged 19-64 years who have chronic kidney failure, nephrotic syndrome, asplenia, or immunocompromised conditions. People who received 1-2 doses of PPSV23 before age 75 years should receive another dose of PPSV23 vaccine at age 65 years or later if at least 5 years have passed since the previous dose. Doses of PPSV23 are not needed for people immunized  with PPSV23 at or after age 47 years.   Preventive Services / Frequency  Ages 48 years and over  Blood pressure check.  Lipid and cholesterol check.  Lung cancer screening. / Every year if you are aged 93-80 years and have a 30-pack-year history of smoking and currently smoke or have quit within the past 15 years. Yearly screening is stopped once you have quit smoking for at least 15 years or develop a health problem that would prevent you from having lung cancer treatment.  Clinical breast exam.** / Every year after  age 109 years.  BRCA-related cancer risk assessment.** / For women who have family members with a BRCA-related cancer (breast, ovarian, tubal, or peritoneal cancers).  Mammogram.** / Every year beginning at age 54 years and continuing for as long as you are in good health. Consult with your health care provider.  Pap test.** / Every 3 years starting at age 73 years through age 53 or 12 years with 3 consecutive normal Pap tests. Testing can be stopped between 65 and 70 years with 3 consecutive normal Pap tests and no abnormal Pap or HPV tests in the past 10 years.  Fecal occult blood test (FOBT) of stool. / Every year beginning at age 51 years and continuing until age 37 years. You may not need to do this test if you get a colonoscopy every 10 years.  Flexible sigmoidoscopy or colonoscopy.** / Every 5 years for a flexible sigmoidoscopy or every 10 years for a colonoscopy beginning at age 45 years and continuing until age 41 years.  Hepatitis C blood test.** / For all people born from [birth year] through 1963 and any individual with known risks for hepatitis C.  Osteoporosis screening.** / A one-time screening for women ages 82 years and over and women at risk for fractures or osteoporosis.  Skin self-exam. / Monthly.  Influenza vaccine. / Every year.  Tetanus, diphtheria, and acellular pertussis (Tdap/Td) vaccine.** / 1 dose of Td every 10 years.  Zoster vaccine.** / 1 dose  for adults aged 73 years or older.  Pneumococcal 13-valent conjugate (PCV13) vaccine.** / Consult your health care provider.  Pneumococcal polysaccharide (PPSV23) vaccine.** / 1 dose for all adults aged 61 years and older. Screening for abdominal aortic aneurysm (AAA)  by ultrasound is recommended for people who have history of high blood pressure or who are current or former smokers.

## 2014-01-06 NOTE — Progress Notes (Signed)
Patient ID: Katie Morton, female   DOB: 1946/01/31, 68 y.o.   MRN: [id number]  MEDICARE ANNUAL WELLNESS & PREVENTATIVE VISIT AND CPE  Assessment:   1. Labile hypertension  - Microalbumin / creatinine urine ratio - EKG 12-Lead - [place], RETROPERITNL ABD,  LTD - TSH  2. Chronic obstructive pulmonary disease,   3. Hyperlipidemia  - Lipid panel  4. Elevated hemoglobin A1c  - Hemoglobin A1c - Insulin, fasting  5. Vitamin D deficiency  - Vit D  25 hydroxy (rtn osteoporosis monitoring)  6. Medication management  - Urine Microscopic - CBC with Differential - BASIC METABOLIC PANEL WITH GFR - Hepatic function panel - Magnesium  7. Screening for rectal cancer  - POC Hemoccult Bld/Stl (3-Cd Home Screen); Future  8. Encounter for general adult medical examination with abnormal findings   9. Depression screen  - Negative  10. Need for prophylactic vaccination and inoculation against influenza  - Flu vaccine HIGH DOSE   11. Need for prophylactic vaccination against Streptococcus pneumoniae (pneumococcus)   12. Climacteric  - DG Bone Density; Future  Plan:   During the course of the visit the patient was educated and counseled about appropriate screening and preventive services including:    Pneumococcal vaccine   Influenza vaccine  Td vaccine  Screening electrocardiogram  Bone densitometry screening  Colorectal cancer screening  Diabetes screening  Glaucoma screening  Nutrition counseling   Advanced directives: requested  Screening recommendations, referrals: Vaccinations: DT vaccine 06/10/2005 Influenza vaccine 01/07/2014 Pneumococcal vaccine 1997 Prevnar vaccine 01/07/2014 Shingles vaccine 12/16/2011 Hep B vaccine not indicated  Nutrition assessed and recommended  Colonoscopy 09/03/2009 Recommended yearly ophthalmology/optometry visit for glaucoma screening and checkup Recommended yearly dental visit for hygiene and checkup Advanced  directives - no - given forms today  Conditions/risks identified: BMI: Discussed weight loss, diet, and increase physical activity.  Increase physical activity: AHA recommends 150 minutes of physical activity a week.  Medications reviewed PreDiabetes is at goal, ACE/ARB therapy: No, Reason not on Ace Inhibitor/ARB therapy:  not indicated Urinary Incontinence is not an issue: discussed non pharmacology and pharmacology options.  Fall risk: low- discussed PT, home fall assessment, medications.   Subjective:   Katie Morton is a 68 y.o. MWF who presents for Medicare Annual Wellness Visit and complete physical.  Date of last medicare wellness visit is unknown.  She has had labile elevated blood pressure being monitored expectantly.  Her blood pressure has been controlled at home, & today their BP is BP: 138/78 mmHg   She does workout. She denies chest pain, shortness of breath, dizziness.   She is not on cholesterol medication. Her cholesterol is not at goal. The cholesterol last visit was:  Lab Results  Component Value Date   CHOL 229* 01/02/2013   HDL 70 01/02/2013   LDLCALC 136* 01/02/2013   TRIG 117 01/02/2013   CHOLHDL 3.3 01/02/2013    She has had prediabetes for 3 years (Sept 2012 w/ A1c 5.7%). She has been working on diet and exercise for prediabetes, and denies hyperglycemia, increased appetite, paresthesia of the feet, polydipsia, polyuria and visual disturbances. Last A1C in the office was:  Lab Results  Component Value Date   HGBA1C 5.9* 01/02/2013   Patient is on Vitamin D supplement.   Lab Results  Component Value Date   VD25OH 81 01/02/2013     Names of Other Physician/Practitioners you currently use: 1. [facility] here for primary care 2. Dr Leanord Asal [facility]) ,  eye doctor, last visit 2015 3. Dr Gilford Rile [facility]), dentist, has app't next week*  Patient Care Team: Unk Pinto, MD as PCP - General (Internal  Medicine) Winfield Cunas., MD as Consulting Physician (Gastroenterology) Constance Holster, DDS  Medication Review: Medication Sig  . aspirin EC 81 MG tablet Take daily.  Marland Kitchen CALCIUM CITRATE + D Take  daily.  Marland Kitchen VITAMIN D  Take 2,000 tab daily.   Marland Kitchen CINNAMON PO Take 1,000 caps  2 times daily.   . fish oil 1000 MG cap Take 1 gdaily.   Marland Kitchen FLAXSEED OIL 1000 MG  Take 1,000 cap daily.  Jefm Miles BI-FLEX JOINT  Take 1 tablet  2 times daily.  . MULTIVITAMIN WITH MINERALS Take 1 tablet  daily.  . Multi Vit-Min PRESERVISION Take 1 tablet b daily.   Current Problems (verified) Patient Active Problem List   Diagnosis Date Noted  . Medication management 01/01/2013  . Vitamin D deficiency 01/01/2013  . Screening for malignant neoplasm of the rectum 01/01/2013  . Hyperlipidemia   . Labile hypertension   . Elevated hemoglobin A1c   . Femur fracture 09/20/2012  . Osteoporosis, unspecified 09/20/2012  . COPD (chronic obstructive pulmonary disease)    Screening Tests Health Maintenance  Topic Date Due  . INFLUENZA VACCINE  09/23/2014  . TETANUS/TDAP  02/23/2015  . MAMMOGRAM  05/23/2015  . COLONOSCOPY  02/23/2019  . PNEUMOCOCCAL POLYSACCHARIDE VACCINE AGE 32 AND OVER  Completed  . ZOSTAVAX  Completed   Immunization History  Administered Date(s) Administered  . Influenza, High Dose Seasonal PF 01/02/2013, 01/07/2014  . Influenza-Unspecified 12/16/2011  . Pneumococcal Conjugate-13 01/07/2014  . Pneumococcal-Unspecified 02/23/1995  . Td 02/22/2005  . Zoster 12/16/2011   Preventative care: Last colonoscopy: 09/03/2009  Prior vaccinations: DT: 06/10/2005  Influenza: HD 01/07/2014  Pneumococcal: 1997 Prevnar: 01/07/2014 Shingles/Zostavax: 12/16/2011  History reviewed: allergies, current medications, past family history, past medical history, past social history, past surgical history and problem list  Risk Factors: Tobacco History  Substance Use Topics  . Smoking status: Former Smoker     Quit date: 02/22/1978  . Smokeless tobacco: Not on file  . Alcohol Use: 7.0 oz/week    14 drink(s) per week   She does not smoke.  Patient is a former smoker. Are there smokers in your home (other than you)?  No  Alcohol Current alcohol use: occasional  Caffeine Current caffeine use: coffee 1-2 /day  Exercise Current exercise: hiking  Nutrition/Diet Current diet: in general, a "healthy" diet    Cardiac risk factors: advanced age (older than 6 for men, 12 for women), dyslipidemia and hypertension.  Depression Screen (Note: if answer to either of the following is "Yes", a more complete depression screening is indicated)   Q1: Over the past two weeks, have you felt down, depressed or hopeless? No  Q2: Over the past two weeks, have you felt little interest or pleasure in doing things? No  Have you lost interest or pleasure in daily life? No  Do you often feel hopeless? No  Do you cry easily over simple problems? No  Activities of Daily Living In your present state of health, do you have any difficulty performing the following activities?:  Driving? No Managing money?  No Feeding yourself? No Getting from bed to chair? No Climbing a flight of stairs? No Preparing food and eating?: No Bathing or showering? No Getting dressed: No Getting to the toilet? No Using the toilet:No Moving around from place to place: No In  the past year have you fallen or had a near fall?:No   Are you sexually active?  Yes  Do you have more than one partner?  No  Vision Difficulties: No  Hearing Difficulties: No Do you often ask people to speak up or repeat themselves? No Do you experience ringing or noises in your ears? No Do you have difficulty understanding soft or whispered voices? Sometimes.  Cognition  Do you feel that you have a problem with memory?No  Do you often misplace items? No  Do you feel safe at home?  Yes  Advanced directives Does patient have a [place]? No Does patient have a Living Will? No - given forms today   Objective:     BP 138/78, pulse 56, temp 98 F , resp.  16, ht 5\' 8" , wt 115 lb  BMI 17.49   General appearance: alert, no distress, WD/WN, female Cognitive Testing  Alert? Yes  Normal Appearance?Yes  Oriented to person? Yes  Place? Yes   Time? Yes  Recall of three objects?  Yes  Can perform simple calculations? Yes  Displays appropriate judgment? Yes  Can read the correct time from a watch/clock?Yes  HEENT: normocephalic, sclerae anicteric, TMs pearly, nares patent, no discharge or erythema, pharynx normal Oral cavity: MMM, no lesions Neck: supple, no lymphadenopathy, no thyromegaly, no masses Heart: RRR, normal S1, S2, no murmurs Lungs: CTA bilaterally, no wheezes, rhonchi, or rales Breast Exam : deferred to Gyn. Abdomen: +bs, soft, non tender, non distended, no masses, no hepatomegaly, no splenomegaly Musculoskeletal: nontender, no swelling, no obvious deformity Extremities: no edema, no cyanosis, no clubbing Pulses: 2+ symmetric, upper and lower extremities, normal cap refill Neurological: alert, oriented x 3, CN2-12 intact, strength normal upper extremities and lower extremities, sensation normal throughout, DTRs 2+ throughout, no cerebellar signs, gait normal Skin: Clear w/o rash, lesions, ecchymoses Psychiatric: normal affect, behavior normal, pleasant   Medicare Attestation I have personally reviewed: The patient's medical and social history Their use of alcohol, tobacco or illicit drugs Their current medications and supplements The patient's functional ability including ADLs,fall risks, home safety risks, cognitive, and hearing and visual impairment Diet and physical activities Evidence for depression or mood disorders  The patient's weight, height, BMI, and visual acuity have been recorded in the chart.  I have made referrals, counseling, and provided education to the patient based on review  of the above and I have provided the patient with a written personalized care plan for preventive services.    Raelynne Ludwick DAVID, MD   01/07/2014

## 2014-01-07 ENCOUNTER — Encounter: Payer: Self-pay | Admitting: Internal Medicine

## 2014-01-07 ENCOUNTER — Ambulatory Visit (INDEPENDENT_AMBULATORY_CARE_PROVIDER_SITE_OTHER): Payer: Medicare PPO | Admitting: Internal Medicine

## 2014-01-07 VITALS — BP 138/78 | HR 56 | Temp 98.0°F | Resp 16 | Ht 68.0 in | Wt 115.0 lb

## 2014-01-07 DIAGNOSIS — E559 Vitamin D deficiency, unspecified: Secondary | ICD-10-CM

## 2014-01-07 DIAGNOSIS — Z1331 Encounter for screening for depression: Secondary | ICD-10-CM

## 2014-01-07 DIAGNOSIS — R6889 Other general symptoms and signs: Secondary | ICD-10-CM

## 2014-01-07 DIAGNOSIS — I10 Essential (primary) hypertension: Secondary | ICD-10-CM

## 2014-01-07 DIAGNOSIS — J449 Chronic obstructive pulmonary disease, unspecified: Secondary | ICD-10-CM

## 2014-01-07 DIAGNOSIS — Z23 Encounter for immunization: Secondary | ICD-10-CM

## 2014-01-07 DIAGNOSIS — Z79899 Other long term (current) drug therapy: Secondary | ICD-10-CM

## 2014-01-07 DIAGNOSIS — R0989 Other specified symptoms and signs involving the circulatory and respiratory systems: Secondary | ICD-10-CM

## 2014-01-07 DIAGNOSIS — E785 Hyperlipidemia, unspecified: Secondary | ICD-10-CM

## 2014-01-07 DIAGNOSIS — R7309 Other abnormal glucose: Secondary | ICD-10-CM

## 2014-01-07 DIAGNOSIS — Z1212 Encounter for screening for malignant neoplasm of rectum: Secondary | ICD-10-CM

## 2014-01-07 DIAGNOSIS — N951 Menopausal and female climacteric states: Secondary | ICD-10-CM

## 2014-01-07 DIAGNOSIS — Z0001 Encounter for general adult medical examination with abnormal findings: Secondary | ICD-10-CM

## 2014-01-07 LAB — CBC WITH DIFFERENTIAL/PLATELET
BASOS PCT: 1 % (ref 0–1)
Basophils Absolute: 0.1 10*3/uL (ref 0.0–0.1)
EOS ABS: 0.5 10*3/uL (ref 0.0–0.7)
EOS PCT: 7 % — AB (ref 0–5)
HEMATOCRIT: 40.9 % (ref 36.0–46.0)
HEMOGLOBIN: 13.9 g/dL (ref 12.0–15.0)
LYMPHS ABS: 2.5 10*3/uL (ref 0.7–4.0)
Lymphocytes Relative: 37 % (ref 12–46)
MCH: 30.5 pg (ref 26.0–34.0)
MCHC: 34 g/dL (ref 30.0–36.0)
MCV: 89.9 fL (ref 78.0–100.0)
MONOS PCT: 10 % (ref 3–12)
MPV: 10.5 fL (ref 9.4–12.4)
Monocytes Absolute: 0.7 10*3/uL (ref 0.1–1.0)
NEUTROS PCT: 45 % (ref 43–77)
Neutro Abs: 3 10*3/uL (ref 1.7–7.7)
Platelets: 307 10*3/uL (ref 150–400)
RBC: 4.55 MIL/uL (ref 3.87–5.11)
RDW: 13.9 % (ref 11.5–15.5)
WBC: 6.7 10*3/uL (ref 4.0–10.5)

## 2014-01-07 LAB — HEMOGLOBIN A1C
HEMOGLOBIN A1C: 5.9 % — AB (ref <5.7)
Mean Plasma Glucose: 123 mg/dL — ABNORMAL HIGH (ref <117)

## 2014-01-08 LAB — BASIC METABOLIC PANEL WITH GFR
BUN: 9 mg/dL (ref 6–23)
CO2: 29 meq/L (ref 19–32)
Calcium: 9.4 mg/dL (ref 8.4–10.5)
Chloride: 97 mEq/L (ref 96–112)
Creat: 0.53 mg/dL (ref 0.50–1.10)
GFR, Est African American: >89 mL/min
GFR, Est Non African American: >89 mL/min
GLUCOSE: 86 mg/dL (ref 70–99)
POTASSIUM: 4.6 meq/L (ref 3.5–5.3)
SODIUM: 136 meq/L (ref 135–145)

## 2014-01-08 LAB — HEPATIC FUNCTION PANEL
ALK PHOS: 64 U/L (ref 39–117)
ALT: 21 U/L (ref 0–35)
AST: 20 U/L (ref 0–37)
Albumin: 4.3 g/dL (ref 3.5–5.2)
BILIRUBIN INDIRECT: 0.6 mg/dL (ref 0.2–1.2)
Bilirubin, Direct: 0.2 mg/dL (ref 0.0–0.3)
Total Bilirubin: 0.8 mg/dL (ref 0.2–1.2)
Total Protein: 7.4 g/dL (ref 6.0–8.3)

## 2014-01-08 LAB — LIPID PANEL
CHOL/HDL RATIO: 2.6 ratio
Cholesterol: 204 mg/dL — ABNORMAL HIGH (ref 0–200)
HDL: 78 mg/dL (ref >39)
LDL Cholesterol: 110 mg/dL — ABNORMAL HIGH (ref 0–99)
Triglycerides: 82 mg/dL (ref <150)
VLDL: 16 mg/dL (ref 0–40)

## 2014-01-08 LAB — URINALYSIS, MICROSCOPIC ONLY
BACTERIA UA: NONE SEEN
Casts: NONE SEEN
Crystals: NONE SEEN
Squamous Epithelial / LPF: NONE SEEN

## 2014-01-08 LAB — VITAMIN D 25 HYDROXY (VIT D DEFICIENCY, FRACTURES): Vit D, 25-Hydroxy: 61 ng/mL (ref 30–100)

## 2014-01-08 LAB — MICROALBUMIN / CREATININE URINE RATIO
CREATININE, URINE: 6.1 mg/dL
Microalb, Ur: <0.2 mg/dL (ref <2.0)

## 2014-01-08 LAB — MAGNESIUM: Magnesium: 2.3 mg/dL (ref 1.5–2.5)

## 2014-01-08 LAB — TSH: TSH: 5.679 u[IU]/mL — AB (ref 0.350–4.500)

## 2014-01-08 LAB — INSULIN, FASTING: Insulin fasting, serum: 2.1 u[IU]/mL (ref 2.0–19.6)

## 2014-01-14 ENCOUNTER — Encounter: Payer: Self-pay | Admitting: Internal Medicine

## 2014-01-14 ENCOUNTER — Ambulatory Visit (HOSPITAL_COMMUNITY): Payer: Medicare PPO

## 2014-01-14 ENCOUNTER — Other Ambulatory Visit: Payer: Self-pay | Admitting: Internal Medicine

## 2014-01-14 ENCOUNTER — Ambulatory Visit (HOSPITAL_COMMUNITY)
Admission: RE | Admit: 2014-01-14 | Discharge: 2014-01-14 | Disposition: A | Discharge status: Home / Self Care | Payer: Medicare PPO | Source: Ambulatory Visit | Attending: Internal Medicine | Admitting: Internal Medicine

## 2014-01-14 DIAGNOSIS — M810 Age-related osteoporosis without current pathological fracture: Secondary | ICD-10-CM

## 2014-01-14 DIAGNOSIS — N951 Menopausal and female climacteric states: Secondary | ICD-10-CM

## 2014-01-14 DIAGNOSIS — Z780 Asymptomatic menopausal state: Secondary | ICD-10-CM | POA: Diagnosis not present

## 2014-01-14 DIAGNOSIS — Z13820 Encounter for screening for osteoporosis: Secondary | ICD-10-CM | POA: Insufficient documentation

## 2014-01-14 MED ORDER — ALENDRONATE SODIUM 70 MG PO TABS: ORAL_TABLET | ORAL | Status: DC

## 2014-01-15 ENCOUNTER — Encounter: Payer: Self-pay | Admitting: Internal Medicine

## 2014-02-07 ENCOUNTER — Ambulatory Visit: Payer: Self-pay

## 2014-02-08 ENCOUNTER — Ambulatory Visit (INDEPENDENT_AMBULATORY_CARE_PROVIDER_SITE_OTHER): Payer: Medicare PPO

## 2014-02-08 DIAGNOSIS — R6889 Other general symptoms and signs: Secondary | ICD-10-CM

## 2014-02-08 LAB — TSH: TSH: 4.78 u[IU]/mL — AB (ref 0.350–4.500)

## 2014-02-08 NOTE — Progress Notes (Signed)
Patient ID: Katie Morton, female   DOB: 02-02-1946, 68 y.o.   MRN: [id number] Patient here today to recheck abnormal lab result on thyroid. Patient does not take thyroid medication.

## 2014-02-16 ENCOUNTER — Other Ambulatory Visit: Payer: Self-pay | Admitting: Internal Medicine

## 2014-02-16 MED ORDER — LEVOTHYROXINE SODIUM 50 MCG PO TABS: ORAL_TABLET | ORAL | Status: DC

## 2014-02-18 ENCOUNTER — Encounter: Payer: Self-pay | Admitting: Internal Medicine

## 2014-02-22 DIAGNOSIS — C801 Malignant (primary) neoplasm, unspecified: Secondary | ICD-10-CM

## 2014-02-22 HISTORY — DX: Malignant (primary) neoplasm, unspecified: C80.1

## 2014-03-06 DIAGNOSIS — H35373 Puckering of macula, bilateral: Secondary | ICD-10-CM | POA: Diagnosis not present

## 2014-03-11 ENCOUNTER — Other Ambulatory Visit: Payer: Self-pay

## 2014-03-12 ENCOUNTER — Other Ambulatory Visit: Payer: Self-pay | Admitting: *Deleted

## 2014-03-12 DIAGNOSIS — E039 Hypothyroidism, unspecified: Secondary | ICD-10-CM

## 2014-03-13 ENCOUNTER — Other Ambulatory Visit: Payer: Medicare PPO

## 2014-03-13 DIAGNOSIS — E039 Hypothyroidism, unspecified: Secondary | ICD-10-CM

## 2014-03-13 LAB — TSH: TSH: 3.355 u[IU]/mL (ref 0.350–4.500)

## 2014-03-13 NOTE — Progress Notes (Unsigned)
Patient was instructed to take levothyroxine 50 mcg daily but she is taking 1/2 tab daily.

## 2014-05-12 ENCOUNTER — Encounter: Payer: Self-pay | Admitting: *Deleted

## 2014-06-11 ENCOUNTER — Encounter: Payer: Self-pay | Admitting: Physician Assistant

## 2014-06-11 ENCOUNTER — Ambulatory Visit (INDEPENDENT_AMBULATORY_CARE_PROVIDER_SITE_OTHER): Payer: Medicare PPO | Admitting: Physician Assistant

## 2014-06-11 VITALS — BP 136/70 | HR 56 | Temp 98.2°F | Resp 16 | Ht 68.0 in | Wt 120.0 lb

## 2014-06-11 DIAGNOSIS — M810 Age-related osteoporosis without current pathological fracture: Secondary | ICD-10-CM

## 2014-06-11 DIAGNOSIS — E785 Hyperlipidemia, unspecified: Secondary | ICD-10-CM

## 2014-06-11 DIAGNOSIS — E039 Hypothyroidism, unspecified: Secondary | ICD-10-CM | POA: Insufficient documentation

## 2014-06-11 DIAGNOSIS — E559 Vitamin D deficiency, unspecified: Secondary | ICD-10-CM

## 2014-06-11 DIAGNOSIS — R0989 Other specified symptoms and signs involving the circulatory and respiratory systems: Secondary | ICD-10-CM

## 2014-06-11 DIAGNOSIS — I10 Essential (primary) hypertension: Secondary | ICD-10-CM

## 2014-06-11 DIAGNOSIS — R7309 Other abnormal glucose: Secondary | ICD-10-CM

## 2014-06-11 DIAGNOSIS — Z79899 Other long term (current) drug therapy: Secondary | ICD-10-CM | POA: Diagnosis not present

## 2014-06-11 LAB — BASIC METABOLIC PANEL WITH GFR
BUN: 12 mg/dL (ref 6–23)
CALCIUM: 9.4 mg/dL (ref 8.4–10.5)
CHLORIDE: 100 meq/L (ref 96–112)
CO2: 26 meq/L (ref 19–32)
Creat: 0.6 mg/dL (ref 0.50–1.10)
GFR, Est African American: >89 mL/min
GLUCOSE: 86 mg/dL (ref 70–99)
POTASSIUM: 4.3 meq/L (ref 3.5–5.3)
Sodium: 137 mEq/L (ref 135–145)

## 2014-06-11 LAB — CBC WITH DIFFERENTIAL/PLATELET
BASOS PCT: 1 % (ref 0–1)
Basophils Absolute: 0.1 10*3/uL (ref 0.0–0.1)
EOS ABS: 0.6 10*3/uL (ref 0.0–0.7)
EOS PCT: 7 % — AB (ref 0–5)
HCT: 42 % (ref 36.0–46.0)
HEMOGLOBIN: 13.9 g/dL (ref 12.0–15.0)
Lymphocytes Relative: 26 % (ref 12–46)
Lymphs Abs: 2.2 10*3/uL (ref 0.7–4.0)
MCH: 30.2 pg (ref 26.0–34.0)
MCHC: 33.1 g/dL (ref 30.0–36.0)
MCV: 91.1 fL (ref 78.0–100.0)
MONO ABS: 0.8 10*3/uL (ref 0.1–1.0)
MPV: 10.4 fL (ref 8.6–12.4)
Monocytes Relative: 9 % (ref 3–12)
Neutro Abs: 4.9 10*3/uL (ref 1.7–7.7)
Neutrophils Relative %: 57 % (ref 43–77)
PLATELETS: 294 10*3/uL (ref 150–400)
RBC: 4.61 MIL/uL (ref 3.87–5.11)
RDW: 13.5 % (ref 11.5–15.5)
WBC: 8.6 10*3/uL (ref 4.0–10.5)

## 2014-06-11 LAB — HEPATIC FUNCTION PANEL
ALT: 22 U/L (ref 0–35)
AST: 21 U/L (ref 0–37)
Albumin: 4.1 g/dL (ref 3.5–5.2)
Alkaline Phosphatase: 60 U/L (ref 39–117)
BILIRUBIN TOTAL: 0.6 mg/dL (ref 0.2–1.2)
Bilirubin, Direct: 0.1 mg/dL (ref 0.0–0.3)
Indirect Bilirubin: 0.5 mg/dL (ref 0.2–1.2)
TOTAL PROTEIN: 7.1 g/dL (ref 6.0–8.3)

## 2014-06-11 LAB — LIPID PANEL
CHOL/HDL RATIO: 3 ratio
Cholesterol: 210 mg/dL — ABNORMAL HIGH (ref 0–200)
HDL: 71 mg/dL (ref >=46)
LDL Cholesterol: 120 mg/dL — ABNORMAL HIGH (ref 0–99)
TRIGLYCERIDES: 96 mg/dL (ref <150)
VLDL: 19 mg/dL (ref 0–40)

## 2014-06-11 LAB — HEMOGLOBIN A1C
Hgb A1c MFr Bld: 6 % — ABNORMAL HIGH (ref <5.7)
Mean Plasma Glucose: 126 mg/dL — ABNORMAL HIGH (ref <117)

## 2014-06-11 LAB — MAGNESIUM: Magnesium: 2.3 mg/dL (ref 1.5–2.5)

## 2014-06-11 LAB — TSH: TSH: 2.674 u[IU]/mL (ref 0.350–4.500)

## 2014-06-11 NOTE — Progress Notes (Signed)
Assessment and Plan:  1. Hypertension -Continue medication, monitor blood pressure at home. Continue DASH diet.  Reminder to go to the ER if any CP, SOB, nausea, dizziness, severe HA, changes vision/speech, left arm numbness and tingling and jaw pain.  2. Cholesterol -Continue diet and exercise. Check cholesterol.   3. Prediabetes  -Continue diet and exercise. Check A1C  4. Vitamin D Def - check level and continue medications.   5. Hypothyroidism -check TSH level, continue medications the same, reminded to take on an empty stomach 30-45mins before food.    Continue diet and meds as discussed. Further disposition pending results of labs. Over 30 minutes of exam, counseling, chart review, and critical decision making was performed  HPI 69 y.o. female  presents for 3 month follow up on hypertension, cholesterol, prediabetes, and vitamin D deficiency.   Her blood pressure has been controlled at home, today their BP is BP: 136/70 mmHg  She does workout. She denies chest pain, shortness of breath, dizziness.  She is not on cholesterol medication and denies myalgias. Her cholesterol is at goal. The cholesterol last visit was:   Lab Results  Component Value Date   CHOL 204* 01/07/2014   HDL 78 01/07/2014   LDLCALC 110* 01/07/2014   TRIG 82 01/07/2014   CHOLHDL 2.6 01/07/2014    She has been working on diet and exercise for prediabetes, and denies paresthesia of the feet, polydipsia, polyuria and visual disturbances. Last A1C in the office was:  Lab Results  Component Value Date   HGBA1C 5.9* 01/07/2014  Patient is on Vitamin D supplement.   Lab Results  Component Value Date   VD25OH 61 01/07/2014    She is on thyroid medication. Her medication was not changed last visit, she is on 84mcg daily, not the 50.  Lab Results  Component Value Date   TSH 3.355 03/13/2014  She was recently placed back on fosamax in Dec due to breaking her left femoral neck head in July 2015.      Current Medications:  Current Outpatient Prescriptions on File Prior to Visit  Medication Sig Dispense Refill  . alendronate (FOSAMAX) 70 MG tablet Take 1 tablet weekly (every 7 days) on an empty stomach with a full glass of water 12 tablet 99  . aspirin EC 81 MG tablet Take 81 mg by mouth daily.    . Calcium Citrate-Vitamin D (CALCIUM CITRATE + D PO) Take by mouth daily.    . Cholecalciferol (VITAMIN D PO) Take 2,000 tablets by mouth daily.     Marland Kitchen CINNAMON PO Take 1,000 capsules by mouth 2 (two) times daily.     . fish oil-omega-3 fatty acids 1000 MG capsule Take 1 g by mouth daily.     . Flaxseed, Linseed, (FLAXSEED OIL) 1000 MG CAPS Take 1,000 capsules by mouth daily.    Marland Kitchen levothyroxine (SYNTHROID) 50 MCG tablet Take 1 tablet daily on an empty stomach for 30 min 90 tablet 99  . Misc Natural Products (OSTEO BI-FLEX JOINT SHIELD PO) Take 1 tablet by mouth 2 (two) times daily.    . Multiple Vitamins-Minerals (MULTIVITAMIN WITH MINERALS) tablet Take 1 tablet by mouth daily.    . Multiple Vitamins-Minerals (PRESERVISION AREDS PO) Take 1 tablet by mouth daily.     No current facility-administered medications on file prior to visit.   Medical History:  Past Medical History  Diagnosis Date  . Arthritis   . COPD (chronic obstructive pulmonary disease)   . Osteoporosis   .  Labile hypertension   . Hyperlipidemia   . Elevated hemoglobin A1c   . Villous adenoma 2008   Allergies: No Known Allergies   Review of Systems:  Review of Systems  Constitutional: Negative.   HENT: Negative.   Eyes: Negative.   Respiratory: Negative.   Cardiovascular: Negative.   Gastrointestinal: Negative.   Genitourinary: Negative.   Musculoskeletal: Negative.   Skin: Negative.   Neurological: Negative.   Endo/Heme/Allergies: Negative.   Psychiatric/Behavioral: Negative.     Family history- Review and unchanged Social history- Review and unchanged Physical Exam: BP 136/70 mmHg  Pulse 56  Temp(Src)  98.2 F (36.8 C) (Temporal)  Resp 16  Ht 5\' 8"  (1.727 m)  Wt 120 lb (54.432 kg)  BMI 18.25 kg/m2 Wt Readings from Last 3 Encounters:  06/11/14 120 lb (54.432 kg)  01/07/14 115 lb (52.164 kg)  07/03/13 116 lb (52.617 kg)   General Appearance: Well nourished, in no apparent distress. Eyes: PERRLA, EOMs, conjunctiva no swelling or erythema Sinuses: No Frontal/maxillary tenderness ENT/Mouth: Ext aud canals clear, TMs without erythema, bulging. No erythema, swelling, or exudate on post pharynx.  Tonsils not swollen or erythematous. Hearing normal.  Neck: Supple, thyroid normal.  Respiratory: Respiratory effort normal, BS equal bilaterally without rales, rhonchi, wheezing or stridor.  Cardio: RRR with no MRGs. Brisk peripheral pulses without edema.  Abdomen: Soft, + BS,  Non tender, no guarding, rebound, hernias, masses. Lymphatics: Non tender without lymphadenopathy.  Musculoskeletal: Full ROM, 5/5 strength, Normal gait Skin: Warm, dry without rashes, lesions, ecchymosis.  Neuro: Cranial nerves intact. Normal muscle tone, no cerebellar symptoms. Psych: Awake and oriented X 3, normal affect, Insight and Judgment appropriate.    Vicie Mutters, PA-C 9:55 AM [facility]

## 2014-06-11 NOTE — Patient Instructions (Signed)
Osteoporosis Throughout your life, your body breaks down old bone and replaces it with new bone. As you get older, your body does not replace bone as quickly as it breaks it down. By the age of 30 years, most people begin to gradually lose bone because of the imbalance between bone loss and replacement. Some people lose more bone than others. Bone loss beyond a specified normal degree is considered osteoporosis.  Osteoporosis affects the strength and durability of your bones. The inside of the ends of your bones and your flat bones, like the bones of your pelvis, look like honeycomb, filled with tiny open spaces. As bone loss occurs, your bones become less dense. This means that the open spaces inside your bones become bigger and the walls between these spaces become thinner. This makes your bones weaker. Bones of a person with osteoporosis can become so weak that they can break (fracture) during minor accidents, such as a simple fall. CAUSES  The following factors have been associated with the development of osteoporosis:  Smoking.  Drinking more than 2 alcoholic drinks several days per week.  Long-term use of certain medicines:  Corticosteroids.  Chemotherapy medicines.  Thyroid medicines.  Antiepileptic medicines.  Gonadal hormone suppression medicine.  Immunosuppression medicine.  Being underweight.  Lack of physical activity.  Lack of exposure to the sun. This can lead to vitamin D deficiency.  Certain medical conditions:  Certain inflammatory bowel diseases, such as Crohn disease and ulcerative colitis.  Diabetes.  Hyperthyroidism.  Hyperparathyroidism. RISK FACTORS Anyone can develop osteoporosis. However, the following factors can increase your risk of developing osteoporosis:  Gender--Women are at higher risk than men.  Age--Being older than 50 years increases your risk.  Ethnicity--White and Asian people have an increased risk.  Weight --Being extremely  underweight can increase your risk of osteoporosis.  Family history of osteoporosis--Having a family member who has developed osteoporosis can increase your risk. SYMPTOMS  Usually, people with osteoporosis have no symptoms.  DIAGNOSIS  Signs during a physical exam that may prompt your caregiver to suspect osteoporosis include:  Decreased height. This is usually caused by the compression of the bones that form your spine (vertebrae) because they have weakened and become fractured.  A curving or rounding of the upper back (kyphosis). To confirm signs of osteoporosis, your caregiver may request a procedure that uses 2 low-dose X-ray beams with different levels of energy to measure your bone mineral density (dual-energy X-ray absorptiometry [DXA]). Also, your caregiver may check your level of vitamin D. TREATMENT  The goal of osteoporosis treatment is to strengthen bones in order to decrease the risk of bone fractures. There are different types of medicines available to help achieve this goal. Some of these medicines work by slowing the processes of bone loss. Some medicines work by increasing bone density. Treatment also involves making sure that your levels of calcium and vitamin D are adequate. PREVENTION  There are things you can do to help prevent osteoporosis. Adequate intake of calcium and vitamin D can help you achieve optimal bone mineral density. Regular exercise can also help, especially resistance and weight-bearing activities. If you smoke, quitting smoking is an important part of osteoporosis prevention. MAKE SURE YOU:  Understand these instructions.  Will watch your condition.  Will get help right away if you are not doing well or get worse. FOR MORE INFORMATION www.osteo.org and www.nof.org Document Released: 11/18/2004 Document Revised: 06/05/2012 Document Reviewed: 01/23/2011 [employer] Patient Information 2015 [employer]. This information is not   intended to replace advice  given to you by your health care provider. Make sure you discuss any questions you have with your health care provider. Hypothyroidism The thyroid is a large gland located in the lower front of your neck. The thyroid gland helps control metabolism. Metabolism is how your body handles food. It controls metabolism with the hormone thyroxine. When this gland is underactive (hypothyroid), it produces too little hormone.  CAUSES These include:   Absence or destruction of thyroid tissue.  Goiter due to iodine deficiency.  Goiter due to medications.  Congenital defects (since birth).  Problems with the pituitary. This causes a lack of TSH (thyroid stimulating hormone). This hormone tells the thyroid to turn out more hormone. SYMPTOMS  Lethargy (feeling as though you have no energy)  Cold intolerance  Weight gain (in spite of normal food intake)  Dry skin  Coarse hair  Menstrual irregularity (if severe, may lead to infertility)  Slowing of thought processes Cardiac problems are also caused by insufficient amounts of thyroid hormone. Hypothyroidism in the newborn is cretinism, and is an extreme form. It is important that this form be treated adequately and immediately or it will lead rapidly to retarded physical and mental development. DIAGNOSIS  To prove hypothyroidism, your caregiver may do blood tests and ultrasound tests. Sometimes the signs are hidden. It may be necessary for your caregiver to watch this illness with blood tests either before or after diagnosis and treatment. TREATMENT  Low levels of thyroid hormone are increased by using synthetic thyroid hormone. This is a safe, effective treatment. It usually takes about four weeks to gain the full effects of the medication. After you have the full effect of the medication, it will generally take another four weeks for problems to leave. Your caregiver may start you on low doses. If you have had heart problems the dose may be  gradually increased. It is generally not an emergency to get rapidly to normal. HOME CARE INSTRUCTIONS   Take your medications as your caregiver suggests. Let your caregiver know of any medications you are taking or start taking. Your caregiver will help you with dosage schedules.  As your condition improves, your dosage needs may increase. It will be necessary to have continuing blood tests as suggested by your caregiver.  Report all suspected medication side effects to your caregiver. SEEK MEDICAL CARE IF: Seek medical care if you develop:  Sweating.  Tremulousness (tremors).  Anxiety.  Rapid weight loss.  Heat intolerance.  Emotional swings.  Diarrhea.  Weakness. SEEK IMMEDIATE MEDICAL CARE IF:  You develop chest pain, an irregular heart beat (palpitations), or a rapid heart beat. MAKE SURE YOU:   Understand these instructions.  Will watch your condition.  Will get help right away if you are not doing well or get worse. Document Released: 02/08/2005 Document Revised: 05/03/2011 Document Reviewed: 09/29/2007 [facility] Patient Information 2015 [place], [place]. This information is not intended to replace advice given to you by your health care provider. Make sure you discuss any questions you have with your health care provider.

## 2014-06-12 LAB — VITAMIN D 25 HYDROXY (VIT D DEFICIENCY, FRACTURES): Vit D, 25-Hydroxy: 79 ng/mL (ref 30–100)

## 2014-08-09 ENCOUNTER — Other Ambulatory Visit (HOSPITAL_COMMUNITY): Payer: Self-pay | Admitting: Obstetrics and Gynecology

## 2014-08-09 DIAGNOSIS — Z1231 Encounter for screening mammogram for malignant neoplasm of breast: Secondary | ICD-10-CM

## 2014-08-13 ENCOUNTER — Ambulatory Visit (HOSPITAL_COMMUNITY)
Admission: RE | Admit: 2014-08-13 | Discharge: 2014-08-13 | Disposition: A | Discharge status: Home / Self Care | Payer: Medicare PPO | Source: Ambulatory Visit | Attending: Obstetrics and Gynecology | Admitting: Obstetrics and Gynecology

## 2014-08-13 DIAGNOSIS — Z1231 Encounter for screening mammogram for malignant neoplasm of breast: Secondary | ICD-10-CM | POA: Diagnosis not present

## 2014-10-16 DIAGNOSIS — Z86010 Personal history of colonic polyps: Secondary | ICD-10-CM | POA: Diagnosis not present

## 2014-10-16 DIAGNOSIS — D126 Benign neoplasm of colon, unspecified: Secondary | ICD-10-CM | POA: Diagnosis not present

## 2014-10-16 DIAGNOSIS — D123 Benign neoplasm of transverse colon: Secondary | ICD-10-CM | POA: Diagnosis not present

## 2014-10-16 DIAGNOSIS — K621 Rectal polyp: Secondary | ICD-10-CM | POA: Diagnosis not present

## 2014-10-16 DIAGNOSIS — D128 Benign neoplasm of rectum: Secondary | ICD-10-CM | POA: Diagnosis not present

## 2014-10-16 DIAGNOSIS — Z09 Encounter for follow-up examination after completed treatment for conditions other than malignant neoplasm: Secondary | ICD-10-CM | POA: Diagnosis not present

## 2014-10-16 DIAGNOSIS — D122 Benign neoplasm of ascending colon: Secondary | ICD-10-CM | POA: Diagnosis not present

## 2014-10-16 DIAGNOSIS — D124 Benign neoplasm of descending colon: Secondary | ICD-10-CM | POA: Diagnosis not present

## 2015-01-23 ENCOUNTER — Encounter: Payer: Self-pay | Admitting: Internal Medicine

## 2015-01-23 ENCOUNTER — Ambulatory Visit (INDEPENDENT_AMBULATORY_CARE_PROVIDER_SITE_OTHER): Payer: Medicare PPO | Admitting: Internal Medicine

## 2015-01-23 VITALS — BP 128/72 | HR 68 | Temp 97.7°F | Resp 16 | Ht 68.0 in | Wt 117.4 lb

## 2015-01-23 DIAGNOSIS — Z79899 Other long term (current) drug therapy: Secondary | ICD-10-CM

## 2015-01-23 DIAGNOSIS — Z1331 Encounter for screening for depression: Secondary | ICD-10-CM

## 2015-01-23 DIAGNOSIS — Z1389 Encounter for screening for other disorder: Secondary | ICD-10-CM | POA: Diagnosis not present

## 2015-01-23 DIAGNOSIS — E785 Hyperlipidemia, unspecified: Secondary | ICD-10-CM | POA: Diagnosis not present

## 2015-01-23 DIAGNOSIS — E559 Vitamin D deficiency, unspecified: Secondary | ICD-10-CM

## 2015-01-23 DIAGNOSIS — R6889 Other general symptoms and signs: Secondary | ICD-10-CM

## 2015-01-23 DIAGNOSIS — J449 Chronic obstructive pulmonary disease, unspecified: Secondary | ICD-10-CM

## 2015-01-23 DIAGNOSIS — R7309 Other abnormal glucose: Secondary | ICD-10-CM | POA: Diagnosis not present

## 2015-01-23 DIAGNOSIS — E039 Hypothyroidism, unspecified: Secondary | ICD-10-CM

## 2015-01-23 DIAGNOSIS — Z681 Body mass index (BMI) 19 or less, adult: Secondary | ICD-10-CM | POA: Diagnosis not present

## 2015-01-23 DIAGNOSIS — Z0001 Encounter for general adult medical examination with abnormal findings: Secondary | ICD-10-CM

## 2015-01-23 DIAGNOSIS — Z1212 Encounter for screening for malignant neoplasm of rectum: Secondary | ICD-10-CM

## 2015-01-23 DIAGNOSIS — I10 Essential (primary) hypertension: Secondary | ICD-10-CM | POA: Diagnosis not present

## 2015-01-23 DIAGNOSIS — Z789 Other specified health status: Secondary | ICD-10-CM | POA: Diagnosis not present

## 2015-01-23 DIAGNOSIS — R0989 Other specified symptoms and signs involving the circulatory and respiratory systems: Secondary | ICD-10-CM

## 2015-01-23 DIAGNOSIS — Z23 Encounter for immunization: Secondary | ICD-10-CM | POA: Diagnosis not present

## 2015-01-23 DIAGNOSIS — Z0000 Encounter for general adult medical examination without abnormal findings: Secondary | ICD-10-CM

## 2015-01-23 DIAGNOSIS — Z9181 History of falling: Secondary | ICD-10-CM

## 2015-01-23 LAB — HEPATIC FUNCTION PANEL
ALK PHOS: 61 U/L (ref 33–130)
ALT: 20 U/L (ref 6–29)
AST: 19 U/L (ref 10–35)
Albumin: 3.9 g/dL (ref 3.6–5.1)
BILIRUBIN TOTAL: 0.4 mg/dL (ref 0.2–1.2)
Bilirubin, Direct: 0.1 mg/dL (ref <=0.2)
Indirect Bilirubin: 0.3 mg/dL (ref 0.2–1.2)
Total Protein: 7 g/dL (ref 6.1–8.1)

## 2015-01-23 LAB — LIPID PANEL
Cholesterol: 191 mg/dL (ref 125–200)
HDL: 60 mg/dL (ref >=46)
LDL Cholesterol: 106 mg/dL (ref <130)
Total CHOL/HDL Ratio: 3.2 Ratio (ref <=5.0)
Triglycerides: 126 mg/dL (ref <150)
VLDL: 25 mg/dL (ref <30)

## 2015-01-23 LAB — BASIC METABOLIC PANEL WITH GFR
BUN: 14 mg/dL (ref 7–25)
CO2: 29 mmol/L (ref 20–31)
Calcium: 9.2 mg/dL (ref 8.6–10.4)
Chloride: 97 mmol/L — ABNORMAL LOW (ref 98–110)
Creat: 0.62 mg/dL (ref 0.50–0.99)
GFR, Est African American: >89 mL/min (ref >=60)
Glucose, Bld: 97 mg/dL (ref 65–99)
POTASSIUM: 4.2 mmol/L (ref 3.5–5.3)
SODIUM: 136 mmol/L (ref 135–146)

## 2015-01-23 LAB — CBC WITH DIFFERENTIAL/PLATELET
Basophils Absolute: 0.1 10*3/uL (ref 0.0–0.1)
Basophils Relative: 1 % (ref 0–1)
Eosinophils Absolute: 0.5 10*3/uL (ref 0.0–0.7)
Eosinophils Relative: 4 % (ref 0–5)
HEMATOCRIT: 39.9 % (ref 36.0–46.0)
Hemoglobin: 13.6 g/dL (ref 12.0–15.0)
Lymphocytes Relative: 20 % (ref 12–46)
Lymphs Abs: 2.3 10*3/uL (ref 0.7–4.0)
MCH: 30.9 pg (ref 26.0–34.0)
MCHC: 34.1 g/dL (ref 30.0–36.0)
MCV: 90.7 fL (ref 78.0–100.0)
MPV: 10.4 fL (ref 8.6–12.4)
Monocytes Absolute: 0.7 10*3/uL (ref 0.1–1.0)
Monocytes Relative: 6 % (ref 3–12)
NEUTROS ABS: 7.8 10*3/uL — AB (ref 1.7–7.7)
Neutrophils Relative %: 69 % (ref 43–77)
PLATELETS: 318 10*3/uL (ref 150–400)
RBC: 4.4 MIL/uL (ref 3.87–5.11)
RDW: 13.4 % (ref 11.5–15.5)
WBC: 11.3 10*3/uL — AB (ref 4.0–10.5)

## 2015-01-23 LAB — MAGNESIUM: Magnesium: 2.2 mg/dL (ref 1.5–2.5)

## 2015-01-23 NOTE — Progress Notes (Signed)
Patient ID: Katie Morton, female   DOB: 01-Mar-1945, 69 y.o.   MRN: [id number]  Medicare  Annual  Wellness Visit And  Comprehensive Evaluation & Examination   Assessment:   1. Labile hypertension  - Microalbumin / creatinine urine ratio - EKG 12-Lead - TSH  2. Hyperlipidemia  - Lipid panel - TSH  3. Elevated hemoglobin A1c  - Hemoglobin A1c - Insulin, random  4. Vitamin D deficiency  - VITAMIN D 25 Hydroxy   5. Hypothyroidism   6. Chronic obstructive pulmonary disease,  ([place])   7. Screening for malignant neoplasm of the rectum  - POC Hemoccult Bld/Stl   8. Depression screen   9. At low risk for fall   10. Other abnormal glucose  - Hemoglobin A1c - Insulin, random  11. Need for prophylactic vaccination and inoculation against influenza  - Flu vaccine HIGH DOSE PF (Fluzone High dose)  12. BMI less than 19,adult   13. Medication management  - Urinalysis, Routine w reflex microscopic  - CBC with Differential/Platelet - BASIC METABOLIC PANEL WITH GFR - Hepatic function panel - Magnesium  Plan:   During the course of the visit the patient was educated and counseled about appropriate screening and preventive services including:    Pneumococcal vaccine   Influenza vaccine  Td vaccine  Screening electrocardiogram  Bone densitometry screening  Colorectal cancer screening  Diabetes screening  Glaucoma screening  Nutrition counseling   Advanced directives: requested  Screening recommendations, referrals: Vaccinations:  Immunization History  Administered Date(s) Administered  . Influenza, High Dose Seasonal PF 01/02/2013, 01/07/2014  . Influenza-Unspecified 12/16/2011  . Pneumococcal Conjugate-13 01/07/2014  . Pneumococcal-Unspecified 02/23/1995  . Td 02/22/2005  . Zoster 12/16/2011  Hep B vaccine not indicated  Nutrition assessed and recommended  Colonoscopy 09/03/2009 Recommended yearly ophthalmology/optometry visit for  glaucoma screening and checkup Recommended yearly dental visit for hygiene and checkup Advanced directives - Yes  Conditions/risks identified: BMI: Discussed weight loss, diet, and increase physical activity.  Increase physical activity: AHA recommends 150 minutes of physical activity a week.  Medications reviewed PreDiabetes is at goal, ACE/ARB therapy: Not indicated Urinary Incontinence is not an issue: discussed non pharmacology and pharmacology options.  Fall risk: low- discussed PT, home fall assessment, medications.   Subjective:       Katie Morton  presents for [employer] Visit and presents for a comprehensive evaluation, examination and management of multiple medical co-morbidities.  Date of last medicare wellness visit was11/16/2015.  This very nice 69 y.o. MWF presents also for f/u follow up with labile Hypertension, Hyperlipidemia, Pre-Diabetes and Vitamin D Deficiency.       Patient is monitored expectantly for HTN since 2012 & BP has been controlled at home. Today's BP: 128/72 mmHg. Patient has had no complaints of any cardiac type chest pain, palpitations, dyspnea/orthopnea/PND, dizziness, claudication, or dependent edema.      Hyperlipidemia is not controlled with diet.  Last Lipids were  Cholesterol 210*; HDL 71; LDL 120*; Triglycerides 96 on 06/11/2014.      Also, the patient has history of PreDiabetes with A1c 5.7% in 2012 and has had no symptoms of reactive hypoglycemia, diabetic polys, paresthesias or visual blurring.  Last A1c was 6.0% on 06/11/2014.          Further, the patient also has history of Vitamin D Deficiency and supplements vitamin D without any suspected side-effects. Last vitamin D was 79 on 06/11/2014.  Names of Other Physician/Practitioners you currently use: 1. [facility] here for primary care 2. Dr Leanord Asal, eye doctor -  last visit 2016  3. Dr Gilford Rile, [place] - dentist, last visit 2016  Patient Care  Team: Unk Pinto, MD as PCP - General (Internal Medicine) Laurence Spates, MD as Consulting Physician (Gastroenterology) Constance Holster, DDS  Medication Review: Medication Sig  . alendronate (FOSAMAX) 70 MG tablet Take 1 tablet weekly (every 7 days) on an empty stomach with a full glass of water  . aspirin EC 81 MG tab Take 81 mg by mouth daily.  Marland Kitchen CALCIUM CITRATE + D Take by mouth daily.  Marland Kitchen VITAMIN D  Take 2,000 tablets by mouth daily.   Marland Kitchen CINNAMON PO Take 1,000 capsules by mouth 2 (two) times daily.   . fish oil-omega-3 1000 MG capsule Take 1 g by mouth daily.   Marland Kitchen FLAXSEED OIL 1000 MG CAPS Take 1,000 capsules by mouth daily.  Marland Kitchen levothyroxine 50 MCG tablet Take 1 tablet daily on an empty stomach for 30 min  . Multiple Vitamins-Minerals  Take 1 tablet by mouth daily.  Marland Kitchen PRESERVISION  Take 1 tablet by mouth daily.   No Known Allergies  Current Problems (verified) Patient Active Problem List   Diagnosis Date Noted  . Hypothyroidism 06/11/2014  . Medication management 01/01/2013  . Vitamin D deficiency 01/01/2013  . Screening for malignant neoplasm of the rectum 01/01/2013  . Hyperlipidemia   . Labile hypertension   . Elevated hemoglobin A1c   . Femur fracture ([place]) 09/20/2012  . Osteoporosis 09/20/2012  . COPD (chronic obstructive pulmonary disease) ([place])     Screening Tests Health Maintenance  Topic Date Due  . Hepatitis C Screening  1945-10-22  . INFLUENZA VACCINE  09/23/2014  . PNA vac Low Risk Adult (2 of 2 - PPSV23) 01/08/2015  . TETANUS/TDAP  02/23/2015  . MAMMOGRAM  08/12/2016  . COLONOSCOPY  10/15/2024  . DEXA SCAN  Completed  . ZOSTAVAX  Completed    Immunization History  Administered Date(s) Administered  . Influenza, High Dose Seasonal PF 01/02/2013, 01/07/2014  . Influenza-Unspecified 12/16/2011  . Pneumococcal Conjugate-13 01/07/2014  . Pneumococcal-Unspecified 02/23/1995  . Td 02/22/2005  . Zoster 12/16/2011    Preventative care: Last  colonoscopy: 09/03/2009  Past Medical History  Diagnosis Date  . Arthritis   . COPD (chronic obstructive pulmonary disease) ([place])   . Osteoporosis   . Labile hypertension   . Hyperlipidemia   . Elevated hemoglobin A1c   . Villous adenoma 2008   Past Surgical History  Procedure Laterality Date  . Tonsillectomy    . Mandible surgery    . Hip pinning,cannulated Left 09/19/2012    Procedure: Percutaneous Screw Fixation Left Hip Fracture;  Surgeon: Nita Sells, MD;  Location: [facility];  Service: Orthopedics;  Laterality: Left;    Risk Factors: Tobacco Social History  Substance Use Topics  . Smoking status: Former Smoker    Quit date: 02/22/1978  . Smokeless tobacco: None  . Alcohol Use: 7.0 oz/week    14 drink(s) per week   She does not smoke.  Patient is a former smoker. Are there smokers in your home (other than you)?  No Alcohol Current alcohol use: social drinker  Caffeine Current caffeine use: coffee 1-2 cups /day  Exercise Current exercise: hiking, housecleaning and walking  Nutrition/Diet Current diet: in general, a "healthy" diet    Cardiac risk factors: advanced age (older than 57 for men, 50 for women), dyslipidemia and hypertension.  Depression Screen (Note: if  answer to either of the following is "Yes", a more complete depression screening is indicated)   Q1: Over the past two weeks, have you felt down, depressed or hopeless? No  Q2: Over the past two weeks, have you felt little interest or pleasure in doing things? No  Have you lost interest or pleasure in daily life? No  Do you often feel hopeless? No  Do you cry easily over simple problems? No  Activities of Daily Living In your present state of health, do you have any difficulty performing the following activities?:  Driving? No Managing money?  No Feeding yourself? No Getting from bed to chair? No Climbing a flight of stairs? No Preparing food and eating?: No Bathing or showering?  No Getting dressed: No Getting to the toilet? No Using the toilet:No Moving around from place to place: No In the past year have you fallen or had a near fall?:No   Are you sexually active?  Yes  Do you have more than one partner?  No  Vision Difficulties: No  Hearing Difficulties: No Do you often ask people to speak up or repeat themselves? No Do you experience ringing or noises in your ears? No Do you have difficulty understanding soft or whispered voices? Sometimes.  Cognition  Do you feel that you have a problem with memory?No  Do you often misplace items? No  Do you feel safe at home?  Yes  Advanced directives Does patient have a [place]? Yes Does patient have a Living Will? Yes  ROS: Constitutional: Denies fever, chills, weight loss/gain, headaches, insomnia, fatigue, night sweats, and change in appetite. Eyes: Denies redness, blurred vision, diplopia, discharge, itchy, watery eyes.  ENT: Denies discharge, congestion, post nasal drip, epistaxis, sore throat, earache, hearing loss, dental pain, Tinnitus, Vertigo, Sinus pain, snoring.  Cardio: Denies chest pain, palpitations, irregular heartbeat, syncope, dyspnea, diaphoresis, orthopnea, PND, claudication, edema Respiratory: denies cough, dyspnea, DOE, pleurisy, hoarseness, laryngitis, wheezing.  Gastrointestinal: Denies dysphagia, heartburn, reflux, water brash, pain, cramps, nausea, vomiting, bloating, diarrhea, constipation, hematemesis, melena, hematochezia, jaundice, hemorrhoids Genitourinary: Denies dysuria, frequency, urgency, nocturia, hesitancy, discharge, hematuria, flank pain Breast: Breast lumps, nipple discharge, bleeding.  Musculoskeletal: Denies arthralgia, myalgia, stiffness, Jt. Swelling, pain, limp, and strain/sprain. Denies falls. Skin: Denies puritis, rash, hives, warts, acne, eczema, changing in skin lesion Neuro: No weakness, tremor, incoordination, spasms, paresthesia,  pain Psychiatric: Denies confusion, memory loss, sensory loss. Denies Depression. Endocrine: Denies change in weight, skin, hair change, nocturia, and paresthesia, diabetic polys, visual blurring, hyper / hypo glycemic episodes.  Heme/Lymph: No excessive bleeding, bruising, enlarged lymph nodes  Objective:     BP 128/72 mmHg  Pulse 68  Temp(Src) 97.7 F (36.5 C)  Resp 16  Ht 5\' 8"  (1.727 m)  Wt 117 lb 6.4 oz (53.252 kg)  BMI 17.85 kg/m2  General Appearance: Well nourished, alert, WD/WN, female and in no apparent distress. Eyes: PERRLA, EOMs, conjunctiva no swelling or erythema, normal fundi and vessels. Sinuses: No frontal/maxillary tenderness ENT/Mouth: EACs patent / TMs  nl. Nares clear without erythema, swelling, mucoid exudates. Oral hygiene is good. No erythema, swelling, or exudate. Tongue normal, non-obstructing. Tonsils not swollen or erythematous. Hearing normal.  Neck: Supple, thyroid normal. No bruits, nodes or JVD. Respiratory: Respiratory effort normal.  BS equal and clear bilateral without rales, rhonci, wheezing or stridor. Cardio: Heart sounds are normal with regular rate and rhythm and no murmurs, rubs or gallops. Peripheral pulses are normal and equal bilaterally without edema. No  aortic or femoral bruits. Chest: symmetric with normal excursions and percussion. Breasts: Symmetric, without lumps, nipple discharge, retractions, or fibrocystic changes.  Abdomen: Flat, soft  with nl bowel sounds. Nontender, no guarding, rebound, hernias, masses, or organomegaly.  Lymphatics: Non tender without lymphadenopathy.  Genitourinary:  Musculoskeletal: Full ROM all peripheral extremities, joint stability, 5/5 strength, and normal gait. Skin: Warm and dry without rashes, lesions, cyanosis, clubbing or  ecchymosis.  Neuro: Cranial nerves intact, reflexes equal bilaterally. Normal muscle tone, no cerebellar symptoms. Sensation intact.  Pysch: Alert and oriented X 3, normal affect,  Insight and Judgment appropriate.   Cognitive Testing  Alert? Yes  Normal Appearance?Yes  Oriented to person? Yes  Place? Yes   Time? Yes  Recall of three objects?  Yes  Can perform simple calculations? Yes  Displays appropriate judgment? Yes  Can read the correct time from a watch/clock?Yes  Medicare Attestation I have personally reviewed: The patient's medical and social history Their use of alcohol, tobacco or illicit drugs Their current medications and supplements The patient's functional ability including ADLs,fall risks, home safety risks, cognitive, and hearing and visual impairment Diet and physical activities Evidence for depression or mood disorders  The patient's weight, height, BMI, and visual acuity have been recorded in the chart.  I have made referrals, counseling, and provided education to the patient based on review of the above and I have provided the patient with a written personalized care plan for preventive services.  Over 40 minutes of exam, counseling, chart review was performed.  Krysten Veronica DAVID, MD   01/23/2015

## 2015-01-23 NOTE — Patient Instructions (Signed)

## 2015-01-24 LAB — MICROALBUMIN / CREATININE URINE RATIO
Creatinine, Urine: 15 mg/dL — ABNORMAL LOW (ref 20–320)
Microalb, Ur: <0.2 mg/dL

## 2015-01-24 LAB — URINALYSIS, ROUTINE W REFLEX MICROSCOPIC
Bilirubin Urine: NEGATIVE
Glucose, UA: NEGATIVE
Hgb urine dipstick: NEGATIVE
Ketones, ur: NEGATIVE
Leukocytes, UA: NEGATIVE
Nitrite: NEGATIVE
Protein, ur: NEGATIVE
Specific Gravity, Urine: 1.004 (ref 1.001–1.035)
pH: 7 (ref 5.0–8.0)

## 2015-01-24 LAB — HEMOGLOBIN A1C
Hgb A1c MFr Bld: 5.8 % — ABNORMAL HIGH (ref <5.7)
Mean Plasma Glucose: 120 mg/dL — ABNORMAL HIGH (ref <117)

## 2015-01-24 LAB — VITAMIN D 25 HYDROXY (VIT D DEFICIENCY, FRACTURES): VIT D 25 HYDROXY: 81 ng/mL (ref 30–100)

## 2015-01-24 LAB — TSH: TSH: 2.391 u[IU]/mL (ref 0.350–4.500)

## 2015-01-24 LAB — INSULIN, RANDOM: Insulin: 29.4 u[IU]/mL — ABNORMAL HIGH (ref 2.0–19.6)

## 2015-02-11 DIAGNOSIS — Z01419 Encounter for gynecological examination (general) (routine) without abnormal findings: Secondary | ICD-10-CM | POA: Diagnosis not present

## 2015-02-17 ENCOUNTER — Encounter: Payer: Self-pay | Admitting: *Deleted

## 2015-03-03 ENCOUNTER — Other Ambulatory Visit: Payer: Self-pay | Admitting: *Deleted

## 2015-03-03 DIAGNOSIS — Z1212 Encounter for screening for malignant neoplasm of rectum: Secondary | ICD-10-CM

## 2015-03-03 LAB — POC HEMOCCULT BLD/STL (HOME/3-CARD/SCREEN)
Card #2 Fecal Occult Blod, POC: NEGATIVE
Card #3 Fecal Occult Blood, POC: NEGATIVE
Fecal Occult Blood, POC: NEGATIVE

## 2015-03-07 ENCOUNTER — Other Ambulatory Visit: Payer: Self-pay | Admitting: Internal Medicine

## 2015-07-14 ENCOUNTER — Other Ambulatory Visit: Payer: Self-pay

## 2015-07-14 DIAGNOSIS — Z1231 Encounter for screening mammogram for malignant neoplasm of breast: Secondary | ICD-10-CM

## 2015-07-17 ENCOUNTER — Telehealth: Payer: Self-pay | Admitting: *Deleted

## 2015-07-17 MED ORDER — CEPHALEXIN 500 MG PO CAPS: ORAL_CAPSULE | ORAL | Status: DC

## 2015-07-17 NOTE — Telephone Encounter (Signed)
Patient called and states she has a bump on her right hand that is red and swollen.  Patient is out of town and request an antibiotic be sent in.  Per Dr Melford Aase, it is OK to send in an RX for Keflex 500 mg .Marland KitchenPatient is aware.

## 2015-07-25 ENCOUNTER — Ambulatory Visit: Payer: Self-pay | Admitting: Internal Medicine

## 2015-07-29 ENCOUNTER — Ambulatory Visit: Payer: Self-pay | Admitting: Internal Medicine

## 2015-08-07 ENCOUNTER — Encounter: Payer: Self-pay | Admitting: Internal Medicine

## 2015-08-07 ENCOUNTER — Ambulatory Visit (INDEPENDENT_AMBULATORY_CARE_PROVIDER_SITE_OTHER): Payer: Medicare Other | Admitting: Internal Medicine

## 2015-08-07 VITALS — BP 126/62 | HR 56 | Temp 98.2°F | Resp 18 | Ht 68.0 in | Wt 115.0 lb

## 2015-08-07 DIAGNOSIS — E559 Vitamin D deficiency, unspecified: Secondary | ICD-10-CM

## 2015-08-07 DIAGNOSIS — Z681 Body mass index (BMI) 19 or less, adult: Secondary | ICD-10-CM | POA: Diagnosis not present

## 2015-08-07 DIAGNOSIS — R0989 Other specified symptoms and signs involving the circulatory and respiratory systems: Secondary | ICD-10-CM

## 2015-08-07 DIAGNOSIS — R6889 Other general symptoms and signs: Secondary | ICD-10-CM

## 2015-08-07 DIAGNOSIS — J449 Chronic obstructive pulmonary disease, unspecified: Secondary | ICD-10-CM | POA: Diagnosis not present

## 2015-08-07 DIAGNOSIS — Z0000 Encounter for general adult medical examination without abnormal findings: Secondary | ICD-10-CM

## 2015-08-07 DIAGNOSIS — E785 Hyperlipidemia, unspecified: Secondary | ICD-10-CM

## 2015-08-07 DIAGNOSIS — Z23 Encounter for immunization: Secondary | ICD-10-CM | POA: Diagnosis not present

## 2015-08-07 DIAGNOSIS — Z0001 Encounter for general adult medical examination with abnormal findings: Secondary | ICD-10-CM | POA: Diagnosis not present

## 2015-08-07 DIAGNOSIS — R7309 Other abnormal glucose: Secondary | ICD-10-CM

## 2015-08-07 DIAGNOSIS — M810 Age-related osteoporosis without current pathological fracture: Secondary | ICD-10-CM | POA: Diagnosis not present

## 2015-08-07 DIAGNOSIS — I10 Essential (primary) hypertension: Secondary | ICD-10-CM

## 2015-08-07 DIAGNOSIS — Z79899 Other long term (current) drug therapy: Secondary | ICD-10-CM

## 2015-08-07 DIAGNOSIS — Z1159 Encounter for screening for other viral diseases: Secondary | ICD-10-CM

## 2015-08-07 DIAGNOSIS — E039 Hypothyroidism, unspecified: Secondary | ICD-10-CM

## 2015-08-07 LAB — CBC WITH DIFFERENTIAL/PLATELET
BASOS PCT: 1 %
Basophils Absolute: 70 cells/uL (ref 0–200)
EOS PCT: 6 %
Eosinophils Absolute: 420 cells/uL (ref 15–500)
HCT: 40.7 % (ref 35.0–45.0)
Hemoglobin: 13.3 g/dL (ref 11.7–15.5)
LYMPHS ABS: 2100 {cells}/uL (ref 850–3900)
Lymphocytes Relative: 30 %
MCH: 29.7 pg (ref 27.0–33.0)
MCHC: 32.7 g/dL (ref 32.0–36.0)
MCV: 90.8 fL (ref 80.0–100.0)
MPV: 10.6 fL (ref 7.5–12.5)
Monocytes Absolute: 700 cells/uL (ref 200–950)
Monocytes Relative: 10 %
NEUTROS ABS: 3710 {cells}/uL (ref 1500–7800)
Neutrophils Relative %: 53 %
Platelets: 241 10*3/uL (ref 140–400)
RBC: 4.48 MIL/uL (ref 3.80–5.10)
RDW: 13.3 % (ref 11.0–15.0)
WBC: 7 10*3/uL (ref 3.8–10.8)

## 2015-08-07 LAB — TSH: TSH: 2.28 mIU/L

## 2015-08-07 MED ORDER — FLUTICASONE PROPIONATE 50 MCG/ACT NA SUSP: 2.0000 | Freq: Every day | NASAL | Status: DC

## 2015-08-07 NOTE — Progress Notes (Signed)
MEDICARE ANNUAL WELLNESS VISIT AND FOLLOW UP  Assessment:   1. Hypothyroidism, unspecified hypothyroidism type -cont levothyroxine - TSH  2. Labile hypertension -cont monitoring -currently managed by diet and exericse - TSH  3. Hyperlipidemia -diet and exercise as tolerated -will check Lipid panel next visit  4. Medication management  - CBC with Differential/Platelet - BASIC METABOLIC PANEL WITH GFR - Hepatic function panel  5. Need for hepatitis C screening test  - Hepatitis C antibody  6. Need for prophylactic vaccination against Streptococcus pneumoniae (pneumococcus) -PNA shots up to date - Pneumococcal polysaccharide vaccine 23-valent greater than or equal to 2yo subcutaneous/IM  7. Chronic obstructive pulmonary disease, unspecified COPD type ([place]) -cont to monitor -no current treatment needed  8. Osteoporosis -cont fosamax -Bone Density scan next year  9. Elevated hemoglobin A1c -check a1c next visit -diet and exercise as tolerated  10. Vitamin D deficiency -cont Vit D   11. BMI less than 19,adult -exercise as tolerated -cont increased calorie load  12. Medicare annual wellness visit, subsequent -due next year     Over 30 minutes of exam, counseling, chart review, and critical decision making was performed Future Appointments Date Time Provider [place]  08/20/2015 2:10 PM [facility] T[facility]  01/28/2016 11:15 AM Unk Pinto, MD [facility] None     Plan:   During the course of the visit the patient was educated and counseled about appropriate screening and preventive services including:    Pneumococcal vaccine   Influenza vaccine  Td vaccine  Prevnar 13  Screening electrocardiogram  Screening mammography  Bone densitometry screening  Colorectal cancer screening  Diabetes screening  Glaucoma screening  Nutrition counseling   Advanced directives: given info/requested copies  Conditions/risks  identified: Diabetes is at goal, ACE/ARB therapy: No, Reason not on Ace Inhibitor/ARB therapy:  not indicated  Urinary Incontinence is an issue: discussed non pharmacology and pharmacology options. Patient has stress incontienence and declines treatment Fall risk: low- discussed PT, home fall assessment, medications.    Subjective:   Katie Morton is a 70 y.o. female who presents for Medicare Annual Wellness Visit and 3 month follow up on hypertension, prediabetes, hyperlipidemia, vitamin D def.  Date of last medicare wellness visit was 12/16  Her blood pressure has been controlled at home, today their BP is BP: 126/62 mmHg She does workout. She denies chest pain, shortness of breath, dizziness.  She is on cholesterol medication and denies myalgias. Her cholesterol is at goal. The cholesterol last visit was:   Lab Results  Component Value Date   CHOL 191 01/23/2015   HDL 60 01/23/2015   LDLCALC 106 01/23/2015   TRIG 126 01/23/2015   CHOLHDL 3.2 01/23/2015   Patient does have a mild history of prediabetes controlled with diet and exercise.  She notes no changes in weight or in exercise. Lab Results  Component Value Date   HGBA1C 5.8* 01/23/2015   Last GFR Lab Results  Component Value Date   [employer] >89 01/23/2015   Lab Results  Component Value Date   GFRAA >89 01/23/2015   Patient is on Vitamin D supplement. Lab Results  Component Value Date   VD25OH 53 01/23/2015     She notes that she occasionally gets sinus pressure and nearly consistently has some form of post nasal drainage.  She does not currently take anything for allergies.  She does not consistently get sinus infections.    Medication Review Current Outpatient Prescriptions on File Prior to Visit  Medication  Sig Dispense Refill  . alendronate (FOSAMAX) 70 MG tablet TAKE 1 TABLET BY MOUTH EVERY 7 DAYS ON AN EMPTY STOMACH 12 tablet 2  . aspirin EC 81 MG tablet Take 81 mg by mouth daily.    . Calcium  Citrate-Vitamin D (CALCIUM CITRATE + D PO) Take by mouth daily.    . Cholecalciferol (VITAMIN D PO) Take 2,000 tablets by mouth daily.     Marland Kitchen CINNAMON PO Take 1,000 capsules by mouth 2 (two) times daily.     . fish oil-omega-3 fatty acids 1000 MG capsule Take 1 g by mouth daily.     . Flaxseed, Linseed, (FLAXSEED OIL) 1000 MG CAPS Take 1,000 capsules by mouth daily.    Marland Kitchen levothyroxine (SYNTHROID) 50 MCG tablet Take 1 tablet daily on an empty stomach for 30 min 90 tablet 99  . Misc Natural Products (OSTEO BI-FLEX JOINT SHIELD PO) Take 1 tablet by mouth 2 (two) times daily.    . Multiple Vitamins-Minerals (MULTIVITAMIN WITH MINERALS) tablet Take 1 tablet by mouth daily.    . Multiple Vitamins-Minerals (PRESERVISION AREDS PO) Take 1 tablet by mouth daily.     No current facility-administered medications on file prior to visit.    Current Problems (verified) Patient Active Problem List   Diagnosis Date Noted  . BMI less than 19,adult 01/23/2015  . Medicare annual wellness visit, subsequent 01/23/2015  . Hypothyroidism 06/11/2014  . Medication management 01/01/2013  . Vitamin D deficiency 01/01/2013  . Screening for malignant neoplasm of the rectum 01/01/2013  . Hyperlipidemia   . Labile hypertension   . Elevated hemoglobin A1c   . Femur fracture ([place]) 09/20/2012  . Osteoporosis 09/20/2012  . COPD (chronic obstructive pulmonary disease) ([facility])     Screening Tests Immunization History  Administered Date(s) Administered  . Influenza, High Dose Seasonal PF 01/02/2013, 01/07/2014, 01/23/2015  . Influenza-Unspecified 12/16/2011  . Pneumococcal Conjugate-13 01/07/2014  . Pneumococcal Polysaccharide-23 08/07/2015  . Pneumococcal-Unspecified 02/23/1995  . Td 02/22/2005  . Zoster 12/16/2011    Preventative care: Last colonoscopy: 2011 Last mammogram: 6/16 DEXA:2015  Prior vaccinations: TD or Tdap: Declined due to cost  Influenza: 2016 Pneumococcal: 2017 Prevnar13:  2015 Shingles/Zostavax: 2013  Names of Other Physician/Practitioners you currently use: 1. [facility]- here for primary care 2. Dr. Leanord Asal, eye doctor, last visit 2016  3. Dr. Gilford Rile, dentist, last visit 2016d Patient Care Team: Unk Pinto, MD as PCP - General (Internal Medicine) Laurence Spates, MD as Consulting Physician (Gastroenterology) Constance Holster, DDS  Past Surgical History  Procedure Laterality Date  . Tonsillectomy    . Mandible surgery    . Hip pinning,cannulated Left 09/19/2012    Procedure: Percutaneous Screw Fixation Left Hip Fracture;  Surgeon: Nita Sells, MD;  Location: [facility];  Service: Orthopedics;  Laterality: Left;   Family History  Problem Relation Age of Onset  . CAD Paternal Grandfather   . CAD Maternal Grandfather   . Hypertension Mother   . Diabetes Mother   . Mitral valve prolapse Mother   . Heart disease Mother   . Cancer - Other Father     Leukemia and Melanoma  . COPD Father   . Stroke Father   . Heart disease Brother   . Cancer Maternal Aunt     colon  . Cancer Paternal Aunt     colon   Social History  Substance Use Topics  . Smoking status: Former Smoker    Quit date: 02/22/1978  . Smokeless  tobacco: None  . Alcohol Use: 7.0 oz/week    14 drink(s) per week   No Known Allergies  MEDICARE WELLNESS OBJECTIVES: Tobacco use: She does smoke.  Patient is a former smoker. If yes, counseling given Alcohol Current alcohol use: social drinker Diet: well balanced Physical activity: Current Exercise Habits: Home exercise routine, Type of exercise: walking;strength training/weights, Time (Minutes): 30, Frequency (Times/Week): 5, Weekly Exercise (Minutes/Week): 150, Intensity: Moderate, Exercise limited by: Other - see comments Cardiac risk factors:   Depression/mood screen:   Depression screen [facility] 2/9 08/07/2015  Decreased Interest 0  Down, Depressed, Hopeless 0  PHQ - 2 Score 0    ADLs:   In your present state of health, do you have any difficulty performing the following activities: 08/07/2015 01/23/2015  Hearing? N N  Vision? N N  Difficulty concentrating or making decisions? N N  Walking or climbing stairs? N N  Dressing or bathing? N N  Doing errands, shopping? N N  Preparing Food and eating ? N -  Using the Toilet? N -  In the past six months, have you accidently leaked urine? N -  Do you have problems with loss of bowel control? N -  Managing your Medications? N -  Managing your Finances? N -  Housekeeping or managing your Housekeeping? N -     Cognitive Testing  Alert? Yes  Normal Appearance?Yes  Oriented to person? Yes  Place? Yes   Time? Yes  Recall of three objects?  Yes  Can perform simple calculations? Yes  Displays appropriate judgment?Yes  Can read the correct time from a watch face?Yes  EOL planning: Does patient have an advance directive?: Yes Type of Advance Directive: Healthcare Power of Attorney, Living will Does patient want to make changes to advanced directive?: Yes - information given Copy of advanced directive(s) in chart?: No - copy requested   Objective:   Today's Vitals   08/07/15 1044  BP: 126/62  Pulse: 56  Temp: 98.2 F (36.8 C)  TempSrc: Temporal  Resp: 18  Height: 5\' 8"  (1.727 m)  Weight: 115 lb (52.164 kg)   Body mass index is 17.49 kg/(m^2).  General appearance: alert, no distress, WD/WN,  female HEENT: normocephalic, sclerae anicteric, TMs pearly, nares patent, no discharge or erythema, pharynx normal Oral cavity: MMM, no lesions Neck: supple, no lymphadenopathy, no thyromegaly, no masses Heart: RRR, normal S1, S2, no murmurs Lungs: CTA bilaterally, no wheezes, rhonchi, or rales Abdomen: +bs, soft, non tender, non distended, no masses, no hepatomegaly, no splenomegaly Musculoskeletal: nontender, no swelling, no obvious deformity Extremities: no edema, no cyanosis, no clubbing Pulses: 2+ symmetric, upper and lower  extremities, normal cap refill Neurological: alert, oriented x 3, CN2-12 intact, strength normal upper extremities and lower extremities, sensation normal throughout, DTRs 2+ throughout, no cerebellar signs, gait normal Psychiatric: normal affect, behavior normal, pleasant  Breast: defer Gyn: defer Rectal: defer   Medicare Attestation I have personally reviewed: The patient's medical and social history Their use of alcohol, tobacco or illicit drugs Their current medications and supplements The patient's functional ability including ADLs,fall risks, home safety risks, cognitive, and hearing and visual impairment Diet and physical activities Evidence for depression or mood disorders  The patient's weight, height, BMI, and visual acuity have been recorded in the chart.  I have made referrals, counseling, and provided education to the patient based on review of the above and I have provided the patient with a written personalized care plan for preventive services.  Starlyn Skeans, PA-C   08/07/2015

## 2015-08-08 LAB — HEPATIC FUNCTION PANEL
ALT: 17 U/L (ref 6–29)
AST: 20 U/L (ref 10–35)
Albumin: 4.2 g/dL (ref 3.6–5.1)
Alkaline Phosphatase: 57 U/L (ref 33–130)
BILIRUBIN DIRECT: 0.1 mg/dL (ref <=0.2)
Indirect Bilirubin: 0.6 mg/dL (ref 0.2–1.2)
Total Bilirubin: 0.7 mg/dL (ref 0.2–1.2)
Total Protein: 7.1 g/dL (ref 6.1–8.1)

## 2015-08-08 LAB — BASIC METABOLIC PANEL WITH GFR
BUN: 10 mg/dL (ref 7–25)
CHLORIDE: 103 mmol/L (ref 98–110)
CO2: 27 mmol/L (ref 20–31)
Calcium: 9.3 mg/dL (ref 8.6–10.4)
Creat: 0.54 mg/dL (ref 0.50–0.99)
GFR, Est African American: >89 mL/min (ref >=60)
Glucose, Bld: 87 mg/dL (ref 65–99)
Potassium: 4 mmol/L (ref 3.5–5.3)
SODIUM: 139 mmol/L (ref 135–146)

## 2015-08-08 LAB — HEPATITIS C ANTIBODY: HCV Ab: NEGATIVE

## 2015-08-14 ENCOUNTER — Encounter: Payer: Self-pay | Admitting: Internal Medicine

## 2015-08-20 ENCOUNTER — Ambulatory Visit
Admission: RE | Admit: 2015-08-20 | Discharge: 2015-08-20 | Disposition: A | Discharge status: Home / Self Care | Payer: Medicare Other | Source: Ambulatory Visit

## 2015-08-20 DIAGNOSIS — Z1231 Encounter for screening mammogram for malignant neoplasm of breast: Secondary | ICD-10-CM

## 2015-11-07 ENCOUNTER — Other Ambulatory Visit: Payer: Self-pay | Admitting: Internal Medicine

## 2015-12-01 ENCOUNTER — Other Ambulatory Visit: Payer: Self-pay | Admitting: Internal Medicine

## 2015-12-01 ENCOUNTER — Ambulatory Visit (INDEPENDENT_AMBULATORY_CARE_PROVIDER_SITE_OTHER): Payer: Medicare Other | Admitting: Internal Medicine

## 2015-12-01 ENCOUNTER — Encounter: Payer: Self-pay | Admitting: Internal Medicine

## 2015-12-01 VITALS — BP 144/80 | HR 64 | Temp 97.3°F | Resp 16 | Ht 68.0 in | Wt 116.6 lb

## 2015-12-01 DIAGNOSIS — E039 Hypothyroidism, unspecified: Secondary | ICD-10-CM | POA: Diagnosis not present

## 2015-12-01 DIAGNOSIS — A09 Infectious gastroenteritis and colitis, unspecified: Secondary | ICD-10-CM

## 2015-12-01 DIAGNOSIS — Z23 Encounter for immunization: Secondary | ICD-10-CM

## 2015-12-01 DIAGNOSIS — R197 Diarrhea, unspecified: Secondary | ICD-10-CM

## 2015-12-01 LAB — CBC WITH DIFFERENTIAL/PLATELET
BASOS ABS: 98 {cells}/uL (ref 0–200)
Basophils Relative: 1 %
EOS PCT: 6 %
Eosinophils Absolute: 588 cells/uL — ABNORMAL HIGH (ref 15–500)
HCT: 41.9 % (ref 35.0–45.0)
Hemoglobin: 13.7 g/dL (ref 11.7–15.5)
LYMPHS ABS: 2646 {cells}/uL (ref 850–3900)
LYMPHS PCT: 27 %
MCH: 29.8 pg (ref 27.0–33.0)
MCHC: 32.7 g/dL (ref 32.0–36.0)
MCV: 91.3 fL (ref 80.0–100.0)
MONOS PCT: 8 %
MPV: 10.5 fL (ref 7.5–12.5)
Monocytes Absolute: 784 cells/uL (ref 200–950)
Neutro Abs: 5684 cells/uL (ref 1500–7800)
Neutrophils Relative %: 58 %
PLATELETS: 380 10*3/uL (ref 140–400)
RBC: 4.59 MIL/uL (ref 3.80–5.10)
RDW: 13.7 % (ref 11.0–15.0)
WBC: 9.8 10*3/uL (ref 3.8–10.8)

## 2015-12-01 LAB — BASIC METABOLIC PANEL WITH GFR
BUN: 13 mg/dL (ref 7–25)
CALCIUM: 9.3 mg/dL (ref 8.6–10.4)
CO2: 27 mmol/L (ref 20–31)
CREATININE: 0.57 mg/dL (ref 0.50–0.99)
Chloride: 101 mmol/L (ref 98–110)
GFR, Est Non African American: >89 mL/min (ref >=60)
Glucose, Bld: 119 mg/dL — ABNORMAL HIGH (ref 65–99)
Potassium: 3.9 mmol/L (ref 3.5–5.3)
SODIUM: 137 mmol/L (ref 135–146)

## 2015-12-01 LAB — MAGNESIUM: MAGNESIUM: 2.1 mg/dL (ref 1.5–2.5)

## 2015-12-01 MED ORDER — METRONIDAZOLE 500 MG PO TABS: 500.0000 mg | ORAL_TABLET | Freq: Three times a day (TID) | ORAL | 1 refills | Status: DC

## 2015-12-01 MED ORDER — CIPROFLOXACIN HCL 500 MG PO TABS: 500.0000 mg | ORAL_TABLET | Freq: Two times a day (BID) | ORAL | 1 refills | Status: DC

## 2015-12-01 NOTE — Patient Instructions (Signed)
Diarrhea Diarrhea is frequent loose and watery bowel movements. It can cause you to feel weak and dehydrated. Dehydration can cause you to become tired and thirsty, have a dry mouth, and have decreased urination that often is dark yellow. Diarrhea is a sign of another problem, most often an infection that will not last long. In most cases, diarrhea typically lasts 2-3 days. However, it can last longer if it is a sign of something more serious. It is important to treat your diarrhea as directed by your caregiver to lessen or prevent future episodes of diarrhea. CAUSES  Some common causes include:  Gastrointestinal infections caused by viruses, bacteria, or parasites.  Food poisoning or food allergies.  Certain medicines, such as antibiotics, chemotherapy, and laxatives.  Artificial sweeteners and fructose.  Digestive disorders. HOME CARE INSTRUCTIONS  Ensure adequate fluid intake (hydration): Have 1 cup (8 oz) of fluid for each diarrhea episode. Avoid fluids that contain simple sugars or sports drinks, fruit juices, whole milk products, and sodas. Your urine should be clear or pale yellow if you are drinking enough fluids. Hydrate with an oral rehydration solution that you can purchase at pharmacies, retail stores, and online. You can prepare an oral rehydration solution at home by mixing the following ingredients together:   - tsp table salt.   tsp baking soda.   tsp salt substitute containing potassium chloride.  1  tablespoons sugar.  1 L (34 oz) of water.  Certain foods and beverages may increase the speed at which food moves through the gastrointestinal (GI) tract. These foods and beverages should be avoided and include:  Caffeinated and alcoholic beverages.  High-fiber foods, such as raw fruits and vegetables, nuts, seeds, and whole grain breads and cereals.  Foods and beverages sweetened with sugar alcohols, such as xylitol, sorbitol, and mannitol.  Some foods may be well  tolerated and may help thicken stool including:  Starchy foods, such as rice, toast, pasta, low-sugar cereal, oatmeal, grits, baked potatoes, crackers, and bagels.  Bananas.  Applesauce.  Add probiotic-rich foods to help increase healthy bacteria in the GI tract, such as yogurt and fermented milk products.  Wash your hands well after each diarrhea episode.  Only take over-the-counter or prescription medicines as directed by your caregiver.  Take a warm bath to relieve any burning or pain from frequent diarrhea episodes. SEEK IMMEDIATE MEDICAL CARE IF:   You are unable to keep fluids down.  You have persistent vomiting.  You have blood in your stool, or your stools are black and tarry.  You do not urinate in 6-8 hours, or there is only a small amount of very dark urine.  You have abdominal pain that increases or localizes.  You have weakness, dizziness, confusion, or light-headedness.  You have a severe headache.  Your diarrhea gets worse or does not get better.  You have a fever or persistent symptoms for more than 2-3 days.  You have a fever and your symptoms suddenly get worse. MAKE SURE YOU:   Understand these instructions.  Will watch your condition.  Will get help right away if you are not doing well or get worse.  ++++++++++++++++++++++++++  Food Choices to Help Relieve Diarrhea, Adult When you have diarrhea, the foods you eat and your eating habits are very important. Choosing the right foods and drinks can help relieve diarrhea. Also, because diarrhea can last up to 7 days, you need to replace lost fluids and electrolytes (such as sodium, potassium, and chloride) in order to  help prevent dehydration.  WHAT GENERAL GUIDELINES DO I NEED TO FOLLOW?  Slowly drink 1 cup (8 oz) of fluid for each episode of diarrhea. If you are getting enough fluid, your urine will be clear or pale yellow.  Eat starchy foods. Some good choices include white rice, white toast,  pasta, low-fiber cereal, baked potatoes (without the skin), saltine crackers, and bagels.  Avoid large servings of any cooked vegetables.  Limit fruit to two servings per day. A serving is  cup or 1 small piece.  Choose foods with less than 2 g of fiber per serving.  Limit fats to less than 8 tsp (38 g) per day.  Avoid fried foods.  Eat foods that have probiotics in them. Probiotics can be found in certain dairy products.  Avoid foods and beverages that may increase the speed at which food moves through the stomach and intestines (gastrointestinal tract). Things to avoid include:  High-fiber foods, such as dried fruit, raw fruits and vegetables, nuts, seeds, and whole grain foods.  Spicy foods and high-fat foods.  Foods and beverages sweetened with high-fructose corn syrup, honey, or sugar alcohols such as xylitol, sorbitol, and mannitol. WHAT FOODS ARE RECOMMENDED? Grains White rice. White, [place], or pita breads (fresh or toasted), including plain rolls, buns, or bagels. White pasta. Saltine, soda, or graham crackers. Pretzels. Low-fiber cereal. Cooked cereals made with water (such as cornmeal, farina, or cream cereals). Plain muffins. Matzo. Melba toast. Zwieback.  Vegetables Potatoes (without the skin). Strained tomato and vegetable juices. Most well-cooked and canned vegetables without seeds. Tender lettuce. Fruits Cooked or canned applesauce, apricots, cherries, fruit cocktail, grapefruit, peaches, pears, or plums. Fresh bananas, apples without skin, cherries, grapes, cantaloupe, grapefruit, peaches, oranges, or plums.  Meat and Other Protein Products Baked or boiled chicken. Eggs. Tofu. Fish. Seafood. Smooth peanut butter. Ground or well-cooked tender beef, ham, veal, lamb, pork, or poultry.  Dairy Plain yogurt, kefir, and unsweetened liquid yogurt. Lactose-free milk, buttermilk, or soy milk. Plain hard cheese. Beverages Sport drinks. Clear broths. Diluted fruit juices  (except prune). Regular, caffeine-free sodas such as ginger ale. Water. Decaffeinated teas. Oral rehydration solutions. Sugar-free beverages not sweetened with sugar alcohols. Other Bouillon, broth, or soups made from recommended foods.  The items listed above may not be a complete list of recommended foods or beverages. Contact your dietitian for more options. WHAT FOODS ARE NOT RECOMMENDED? Grains Whole grain, whole wheat, bran, or rye breads, rolls, pastas, crackers, and cereals. Wild or brown rice. Cereals that contain more than 2 g of fiber per serving. Corn tortillas or taco shells. Cooked or dry oatmeal. Granola. Popcorn. Vegetables Raw vegetables. Cabbage, broccoli, Brussels sprouts, artichokes, baked beans, beet greens, corn, kale, legumes, peas, sweet potatoes, and yams. Potato skins. Cooked spinach and cabbage. Fruits Dried fruit, including raisins and dates. Raw fruits. Stewed or dried prunes. Fresh apples with skin, apricots, mangoes, pears, raspberries, and strawberries.  Meat and Other Protein Products Chunky peanut butter. Nuts and seeds. Beans and lentils. Berniece Salines.  Dairy High-fat cheeses. Milk, chocolate milk, and beverages made with milk, such as milk shakes. Cream. Ice cream. Sweets and Desserts Sweet rolls, doughnuts, and sweet breads. Pancakes and waffles. Fats and Oils Butter. Cream sauces. Margarine. Salad oils. Plain salad dressings. Olives. Avocados.  Beverages Caffeinated beverages (such as coffee, tea, soda, or energy drinks). Alcoholic beverages. Fruit juices with pulp. Prune juice. Soft drinks sweetened with high-fructose corn syrup or sugar alcohols. Other Coconut. Hot sauce. Chili powder. Mayonnaise. Gravy. Cream-based or  milk-based soups.  The items listed above may not be a complete list of foods and beverages to avoid. Contact your dietitian for more information. WHAT SHOULD I DO IF I BECOME DEHYDRATED? Diarrhea can sometimes lead to dehydration. Signs of  dehydration include dark urine and dry mouth and skin. If you think you are dehydrated, you should rehydrate with an oral rehydration solution. These solutions can be purchased at pharmacies, retail stores, or online.  Drink -1 cup (120-240 mL) of oral rehydration solution each time you have an episode of diarrhea. If drinking this amount makes your diarrhea worse, try drinking smaller amounts more often. For example, drink 1-3 tsp (5-15 mL) every 5-10 minutes.  A general rule for staying hydrated is to drink 1-2 L of fluid per day. Talk to your health care provider about the specific amount you should be drinking each day. Drink enough fluids to keep your urine clear or pale yellow.   This information is not intended to replace advice given to you by your health care provider. Make sure you discuss any questions you have with your health care provider.   Document Released: 05/01/2003 Document Revised: 03/01/2014 Document Reviewed: 01/01/2013 Elsevier Interactive Patient Education [employer].

## 2015-12-01 NOTE — Progress Notes (Signed)
Subjective:    Patient ID: Katie Morton, female    DOB: 23-Dec-1945, 70 y.o.   MRN: [id number]  HPI    Very nice 70 yo MWF with 2-3 week hx/ of non toxic diarrhea up to 5-6 x/day w/o sx's of fever, chills/sweats, rash, house pets, recent travel, envenomations or insect bites. Describes stools as poorly formed to occasionally watery and explosive with mild cramps and "gas". Has has some urgency w/soiling.  Denies N/V or upper GI sx's. No blood or mucus noted in BM's. Does have hx/o compensated hypothyroidism and remote hx/o a villous adenoma removed by colonoscopy in 2005.    Medication Sig  . alendronate (FOSAMAX) 70 MG tablet TAKE 1 TABLET BY MOUTH EVERY 7 DAYS ON AN EMPTY STOMACH  . aspirin EC 81 MG tablet Take 81 mg by mouth daily.  . Calcium Citrate-Vitamin D (CALCIUM CITRATE + D PO) Take by mouth daily.  . Cholecalciferol (VITAMIN D PO) Take 2,000 tablets by mouth daily.   Marland Kitchen CINNAMON PO Take 1,000 capsules by mouth 2 (two) times daily.   . fish oil-omega-3 fatty acids 1000 MG capsule Take 1 g by mouth daily.   . Flaxseed, Linseed, (FLAXSEED OIL) 1000 MG CAPS Take 1,000 capsules by mouth daily.  . fluticasone (FLONASE) 50 MCG/ACT nasal spray Place 2 sprays into both nostrils daily.  . Misc Natural Products (OSTEO BI-FLEX JOINT SHIELD PO) Take 1 tablet by mouth 2 (two) times daily.  . Multiple Vitamins-Minerals (MULTIVITAMIN WITH MINERALS) tablet Take 1 tablet by mouth daily.  . Multiple Vitamins-Minerals (PRESERVISION AREDS PO) Take 1 tablet by mouth daily.  Marland Kitchen OVER THE COUNTER MEDICATION 2 capsules daily. Tumeric w/ Black pepper extract  . levothyroxine (SYNTHROID) 50 MCG tablet Take 1 tablet daily on an empty stomach for 30 min  . alendronate (FOSAMAX) 70 MG tablet TAKE 1 TABLET BY MOUTH EVERY 7 DAYS ON AN EMPTY STOMACH   No Known Allergies  Past Medical History:  Diagnosis Date  . Arthritis   . COPD (chronic obstructive pulmonary disease) ([place])   . Elevated hemoglobin A1c   .  Hyperlipidemia   . Labile hypertension   . Osteoporosis   . Villous adenoma 2008   Past Surgical History:  Procedure Laterality Date  . HIP PINNING,CANNULATED Left 09/19/2012   Procedure: Percutaneous Screw Fixation Left Hip Fracture;  Surgeon: Nita Sells, MD;  Location: [facility];  Service: Orthopedics;  Laterality: Left;  Marland Kitchen MANDIBLE SURGERY    . TONSILLECTOMY     Review of Systems 10 point systems review negative except as above.    Objective:   Physical Exam  BP (!) 144/80   Pulse 64   Temp 97.3 F (36.3 C)   Resp 16   Ht 5\' 8"  (1.727 m)   Wt 116 lb 9.6 oz (52.9 kg)   BMI 17.73 kg/m   HEENT - Eac's patent. TM's Nl. EOM's full. PERRLA. NasoOroPharynx clear. Neck - supple. Nl Thyroid. Carotids 2+ & No bruits, nodes, JVD Chest - Clear equal BS w/o Rales, rhonchi, wheezes. Cor - Nl HS. RRR w/o sig MGR. PP 1(+). No edema. Abd - No palpable organomegaly, masses or tenderness. BS nl. MS- FROM w/o deformities. Muscle power, tone and bulk Nl. Gait Nl. Neuro - No obvious Cr N abnormalities. Sensory, motor and Cerebellar functions appear Nl w/o focal abnormalities. Psyche - Mental status normal & appropriate.  No delusions, ideations or obvious mood abnormalities.'     Assessment & Plan:   1.  Diarrhea of presumed infectious origin  - ciprofloxacin (CIPRO) 500 MG tablet; Take 1 tablet (500 mg total) by mouth 2 (two) times daily.  Dispense: 14 tablet; Refill: 1 - metroNIDAZOLE (FLAGYL) 500 MG tablet; Take 1 tablet (500 mg total) by mouth 3 (three) times daily.  Dispense: 21 tablet; Refill: 1  - CBC with Differential/Platelet - Gastrointestinal Pathogen Panel PCR - BASIC METABOLIC PANEL WITH GFR - Magnesium -  TSH  2. Need for prophylactic vaccination and inoculation against influenza  - Flu vaccine HIGH DOSE PF (Fluzone High dose)

## 2015-12-02 ENCOUNTER — Other Ambulatory Visit: Payer: Self-pay | Admitting: Internal Medicine

## 2015-12-02 LAB — TSH: TSH: 4.26 mIU/L

## 2015-12-03 LAB — GASTROINTESTINAL PATHOGEN PANEL PCR
C. difficile Tox A/B, PCR: NOT DETECTED
CAMPYLOBACTER, PCR: NOT DETECTED
Cryptosporidium, PCR: NOT DETECTED
E COLI 0157, PCR: NOT DETECTED
E coli (ETEC) LT/ST PCR: NOT DETECTED
E coli (STEC) stx1/stx2, PCR: NOT DETECTED
GIARDIA LAMBLIA, PCR: NOT DETECTED
NOROVIRUS, PCR: NOT DETECTED
Rotavirus A, PCR: NOT DETECTED
SALMONELLA, PCR: NOT DETECTED
SHIGELLA, PCR: NOT DETECTED

## 2015-12-04 ENCOUNTER — Encounter: Payer: Self-pay | Admitting: Internal Medicine

## 2015-12-16 ENCOUNTER — Other Ambulatory Visit: Payer: Self-pay | Admitting: Internal Medicine

## 2016-01-24 ENCOUNTER — Encounter: Payer: Self-pay | Admitting: *Deleted

## 2016-01-28 ENCOUNTER — Encounter: Payer: Self-pay | Admitting: Internal Medicine

## 2016-01-28 ENCOUNTER — Ambulatory Visit (INDEPENDENT_AMBULATORY_CARE_PROVIDER_SITE_OTHER): Payer: Medicare Other | Admitting: Internal Medicine

## 2016-01-28 VITALS — BP 116/72 | HR 56 | Temp 97.6°F | Resp 16 | Ht 67.75 in | Wt 114.2 lb

## 2016-01-28 DIAGNOSIS — I10 Essential (primary) hypertension: Secondary | ICD-10-CM

## 2016-01-28 DIAGNOSIS — Z79899 Other long term (current) drug therapy: Secondary | ICD-10-CM

## 2016-01-28 DIAGNOSIS — Z136 Encounter for screening for cardiovascular disorders: Secondary | ICD-10-CM | POA: Diagnosis not present

## 2016-01-28 DIAGNOSIS — R0989 Other specified symptoms and signs involving the circulatory and respiratory systems: Secondary | ICD-10-CM

## 2016-01-28 DIAGNOSIS — R7309 Other abnormal glucose: Secondary | ICD-10-CM

## 2016-01-28 DIAGNOSIS — E039 Hypothyroidism, unspecified: Secondary | ICD-10-CM

## 2016-01-28 DIAGNOSIS — Z1212 Encounter for screening for malignant neoplasm of rectum: Secondary | ICD-10-CM

## 2016-01-28 DIAGNOSIS — E559 Vitamin D deficiency, unspecified: Secondary | ICD-10-CM

## 2016-01-28 DIAGNOSIS — Z0001 Encounter for general adult medical examination with abnormal findings: Secondary | ICD-10-CM

## 2016-01-28 DIAGNOSIS — E782 Mixed hyperlipidemia: Secondary | ICD-10-CM

## 2016-01-28 DIAGNOSIS — Z0000 Encounter for general adult medical examination without abnormal findings: Secondary | ICD-10-CM

## 2016-01-28 LAB — BASIC METABOLIC PANEL WITH GFR
BUN: 10 mg/dL (ref 7–25)
CO2: 25 mmol/L (ref 20–31)
Calcium: 9.3 mg/dL (ref 8.6–10.4)
Chloride: 100 mmol/L (ref 98–110)
Creat: 0.58 mg/dL (ref 0.50–0.99)
Glucose, Bld: 97 mg/dL (ref 65–99)
POTASSIUM: 4.2 mmol/L (ref 3.5–5.3)
SODIUM: 137 mmol/L (ref 135–146)

## 2016-01-28 LAB — CBC WITH DIFFERENTIAL/PLATELET
BASOS PCT: 1 %
Basophils Absolute: 64 cells/uL (ref 0–200)
EOS PCT: 7 %
Eosinophils Absolute: 448 cells/uL (ref 15–500)
HCT: 39.6 % (ref 35.0–45.0)
Hemoglobin: 13.1 g/dL (ref 11.7–15.5)
LYMPHS PCT: 38 %
Lymphs Abs: 2432 cells/uL (ref 850–3900)
MCH: 30.3 pg (ref 27.0–33.0)
MCHC: 33.1 g/dL (ref 32.0–36.0)
MCV: 91.5 fL (ref 80.0–100.0)
MONOS PCT: 10 %
MPV: 11.2 fL (ref 7.5–12.5)
Monocytes Absolute: 640 cells/uL (ref 200–950)
NEUTROS ABS: 2816 {cells}/uL (ref 1500–7800)
Neutrophils Relative %: 44 %
PLATELETS: 329 10*3/uL (ref 140–400)
RBC: 4.33 MIL/uL (ref 3.80–5.10)
RDW: 13.7 % (ref 11.0–15.0)
WBC: 6.4 10*3/uL (ref 3.8–10.8)

## 2016-01-28 LAB — LIPID PANEL
Cholesterol: 233 mg/dL — ABNORMAL HIGH
HDL: 74 mg/dL
LDL Cholesterol: 136 mg/dL — ABNORMAL HIGH
Total CHOL/HDL Ratio: 3.1 ratio
Triglycerides: 114 mg/dL
VLDL: 23 mg/dL

## 2016-01-28 LAB — HEPATIC FUNCTION PANEL
ALBUMIN: 4.1 g/dL (ref 3.6–5.1)
ALK PHOS: 55 U/L (ref 33–130)
ALT: 20 U/L (ref 6–29)
AST: 22 U/L (ref 10–35)
BILIRUBIN DIRECT: 0.1 mg/dL (ref <=0.2)
BILIRUBIN INDIRECT: 0.7 mg/dL (ref 0.2–1.2)
BILIRUBIN TOTAL: 0.8 mg/dL (ref 0.2–1.2)
Total Protein: 7.2 g/dL (ref 6.1–8.1)

## 2016-01-28 LAB — TSH: TSH: 4.09 m[IU]/L

## 2016-01-28 LAB — MAGNESIUM: Magnesium: 2.1 mg/dL (ref 1.5–2.5)

## 2016-01-28 NOTE — Progress Notes (Signed)
[facility] Unk Pinto, M.D.    Uvaldo Bristle. Silverio Lay, P.A.-C      Starlyn Skeans, P.A.-C  [facility]                [address] [place] [id number] Telephone [phone] Telefax [phone]  Annual Screening/Preventative Visit & Comprehensive Evaluation &  Examination     This very nice 70 y.o. MWF presents for a Screening/Preventative Visit & comprehensive evaluation and management of multiple medical co-morbidities.  Patient has been followed for HTN, T2_NIDDM  Prediabetes, Hyperlipidemia and Vitamin D Deficiency.       Patient has hx/o labile HTN predates circa 2012. Patient's BP has been controlled at home and patient denies any cardiac symptoms as chest pain, palpitations, shortness of breath, dizziness or ankle swelling. Today's BP is at goal - 116/72.      Patient's hyperlipidemia is controlled with diet and medications. Patient denies myalgias or other medication SE's. Last lipids were  Lab Results  Component Value Date   CHOL 191 01/23/2015   HDL 60 01/23/2015   LDLCALC 106 01/23/2015   TRIG 126 01/23/2015   CHOLHDL 3.2 01/23/2015      Patient has been on Thyroid replacement since Dec 2015. Patient has prediabetes with A1c 5.7% circa 2012, A1c 6.0% in Apr, 2016  and patient denies reactive hypoglycemic symptoms, visual blurring, diabetic polys, or paresthesias. Last A1c was still not at goal:  Lab Results  Component Value Date   HGBA1C 5.8 (H) 01/23/2015      Finally, patient has history of Vitamin D Deficiency and last Vitamin D was at goal: Lab Results  Component Value Date   VD25OH 81 01/23/2015   Current Outpatient Prescriptions on File Prior to Visit  Medication Sig  . alendronate (FOSAMAX) 70 MG tablet TAKE 1 TABLET BY MOUTH EVERY 7 DAYS ON AN EMPTY STOMACH  . aspirin EC 81 MG tablet Take 81 mg by mouth daily.  Marland Kitchen BIOTIN PO Take 1 capsule by mouth daily.  . Calcium  Citrate-Vitamin D (CALCIUM CITRATE + D PO) Take by mouth daily.  . Cholecalciferol (VITAMIN D PO) Take 2,000 tablets by mouth daily.   Marland Kitchen CINNAMON PO Take 1,000 capsules by mouth 2 (two) times daily.   . fish oil-omega-3 fatty acids 1000 MG capsule Take 1 g by mouth daily.   . Flaxseed, Linseed, (FLAXSEED OIL) 1000 MG CAPS Take 1,000 capsules by mouth daily.  . fluticasone (FLONASE) 50 MCG/ACT nasal spray Place 2 sprays into both nostrils daily.  Marland Kitchen levothyroxine (SYNTHROID, LEVOTHROID) 50 MCG tablet TAKE 1 TABLET DAILY ON AN EMPTY STOMACH FOR 30 MIN  . Misc Natural Products (OSTEO BI-FLEX JOINT SHIELD PO) Take 1 tablet by mouth 2 (two) times daily.  . Multiple Vitamins-Minerals (MULTIVITAMIN WITH MINERALS) tablet Take 1 tablet by mouth daily.  . Multiple Vitamins-Minerals (PRESERVISION AREDS PO) Take 1 tablet by mouth daily.  Marland Kitchen OVER THE COUNTER MEDICATION 2 capsules daily. Tumeric w/ Black pepper extract   No current facility-administered medications on file prior to visit.    No Known Allergies Past Medical History:  Diagnosis Date  . Arthritis   . COPD (chronic obstructive pulmonary disease) ([place])   . Elevated hemoglobin A1c   . Hyperlipidemia   . Labile hypertension   . Osteoporosis   . Villous adenoma 2008   Health Maintenance  Topic Date  Due  . TETANUS/TDAP  02/23/2015  . MAMMOGRAM  08/19/2017  . COLONOSCOPY  10/15/2024  . INFLUENZA VACCINE  Completed  . DEXA SCAN  Completed  . ZOSTAVAX  Completed  . Hepatitis C Screening  Completed  . PNA vac Low Risk Adult  Completed   Immunization History  Administered Date(s) Administered  . Influenza, High Dose Seasonal PF 01/02/2013, 01/07/2014, 01/23/2015, 12/01/2015  . Influenza-Unspecified 12/16/2011  . Pneumococcal Conjugate-13 01/07/2014  . Pneumococcal Polysaccharide-23 08/07/2015  . Pneumococcal-Unspecified 02/23/1995  . Td 02/22/2005  . Zoster 12/16/2011   Past Surgical History:  Procedure Laterality Date  . HIP  PINNING,CANNULATED Left 09/19/2012   Procedure: Percutaneous Screw Fixation Left Hip Fracture;  Surgeon: Nita Sells, MD;  Location: [facility];  Service: Orthopedics;  Laterality: Left;  Marland Kitchen MANDIBLE SURGERY    . TONSILLECTOMY     Family History  Problem Relation Age of Onset  . CAD Paternal Grandfather   . CAD Maternal Grandfather   . Hypertension Mother   . Diabetes Mother   . Mitral valve prolapse Mother   . Heart disease Mother   . Cancer - Other Father     Leukemia and Melanoma  . COPD Father   . Stroke Father   . Heart disease Brother   . Cancer Maternal Aunt     colon  . Cancer Paternal Aunt     colon   Social History  Substance Use Topics  . Smoking status: Former Smoker    Quit date: 02/22/1978  . Smokeless tobacco: Not on file  . Alcohol use 7.0 oz/week    14 drink(s) per week    ROS Constitutional: Denies fever, chills, weight loss/gain, headaches, insomnia,  night sweats, and change in appetite. Does c/o fatigue. Eyes: Denies redness, blurred vision, diplopia, discharge, itchy, watery eyes.  ENT: Denies discharge, congestion, post nasal drip, epistaxis, sore throat, earache, hearing loss, dental pain, Tinnitus, Vertigo, Sinus pain, snoring.  Cardio: Denies chest pain, palpitations, irregular heartbeat, syncope, dyspnea, diaphoresis, orthopnea, PND, claudication, edema Respiratory: denies cough, dyspnea, DOE, pleurisy, hoarseness, laryngitis, wheezing.  Gastrointestinal: Denies dysphagia, heartburn, reflux, water brash, pain, cramps, nausea, vomiting, bloating, diarrhea, constipation, hematemesis, melena, hematochezia, jaundice, hemorrhoids Genitourinary: Denies dysuria, frequency, urgency, nocturia, hesitancy, discharge, hematuria, flank pain Breast: Breast lumps, nipple discharge, bleeding.  Musculoskeletal: Denies arthralgia, myalgia, stiffness, Jt. Swelling, pain, limp, and strain/sprain. Denies falls. Skin: Denies puritis, rash, hives, warts, acne, eczema,  changing in skin lesion Neuro: No weakness, tremor, incoordination, spasms, paresthesia, pain Psychiatric: Denies confusion, memory loss, sensory loss. Denies Depression. Endocrine: Denies change in weight, skin, hair change, nocturia, and paresthesia, diabetic polys, visual blurring, hyper / hypo glycemic episodes.  Heme/Lymph: No excessive bleeding, bruising, enlarged lymph nodes.  Physical Exam  BP 116/72   Pulse (!) 56   Temp 97.6 F (36.4 C)   Resp 16   Ht 5' 7.75" (1.721 m)   Wt 114 lb 3.2 oz (51.8 kg)   BMI 17.49 kg/m   General Appearance: Well nourished and in no apparent distress.  Eyes: PERRLA, EOMs, conjunctiva no swelling or erythema, normal fundi and vessels. Sinuses: No frontal/maxillary tenderness ENT/Mouth: EACs patent / TMs  nl. Nares clear without erythema, swelling, mucoid exudates. Oral hygiene is good. No erythema, swelling, or exudate. Tongue normal, non-obstructing. Tonsils not swollen or erythematous. Hearing normal.  Neck: Supple, thyroid normal. No bruits, nodes or JVD. Respiratory: Respiratory effort normal.  BS equal and clear bilateral without rales, rhonci, wheezing or stridor. Cardio: Heart sounds  are normal with regular rate and rhythm and no murmurs, rubs or gallops. Peripheral pulses are normal and equal bilaterally without edema. No aortic or femoral bruits. Chest: symmetric with normal excursions and percussion. Breasts: Symmetric, without lumps, nipple discharge, retractions, or fibrocystic changes.  Abdomen: Flat, soft with bowel sounds active. Nontender, no guarding, rebound, hernias, masses, or organomegaly.  Lymphatics: Non tender without lymphadenopathy.  Genitourinary:  Musculoskeletal: Full ROM all peripheral extremities, joint stability, 5/5 strength, and normal gait. Skin: Warm and dry without rashes, lesions, cyanosis, clubbing or  ecchymosis.  Neuro: Cranial nerves intact, reflexes equal bilaterally. Normal muscle tone, no cerebellar  symptoms. Sensation intact.  Pysch: Alert and oriented X 3, normal affect, Insight and Judgment appropriate.   Assessment and Plan  1. Annual Preventative Screening Examination  2. Labile hypertension  - Microalbumin / creatinine urine ratio - EKG 12-Lead - Urinalysis, Routine w reflex microscopic - CBC with Differential/Platelet - BASIC METABOLIC PANEL WITH GFR - TSH  3. Mixed hyperlipidemia  - EKG 12-Lead - Hepatic function panel - Lipid panel - TSH  4. Other abnormal glucose  - Hemoglobin A1c - Insulin, random  5. Vitamin D deficiency  - VITAMIN D 25 Hydroxy   6. Acquired hypothyroidism   7. Elevated hemoglobin A1c   8. Screening for malignant neoplasm of the rectum  - POC Hemoccult Bld/Stl   9. Screening for ischemic heart disease  - EKG 12-Lead  10. Medication management  - Urinalysis, Routine w reflex microscopic - BASIC METABOLIC PANEL WITH GFR - Hepatic function panel - Magnesium      Continue prudent diet as discussed, weight control, BP monitoring, regular exercise, and medications. Discussed med's effects and SE's. Screening labs and tests as requested with regular follow-up as recommended. Over 40 minutes of exam, counseling, chart review and high complex critical decision making was performed.

## 2016-01-28 NOTE — Patient Instructions (Signed)

## 2016-01-29 LAB — URINALYSIS, ROUTINE W REFLEX MICROSCOPIC
Bilirubin Urine: NEGATIVE
Glucose, UA: NEGATIVE
HGB URINE DIPSTICK: NEGATIVE
KETONES UR: NEGATIVE
LEUKOCYTES UA: NEGATIVE
NITRITE: NEGATIVE
Protein, ur: NEGATIVE
Specific Gravity, Urine: 1.003 (ref 1.001–1.035)
pH: 7.5 (ref 5.0–8.0)

## 2016-01-29 LAB — HEMOGLOBIN A1C
HEMOGLOBIN A1C: 5.5 % (ref <5.7)
MEAN PLASMA GLUCOSE: 111 mg/dL

## 2016-01-29 LAB — INSULIN, RANDOM: Insulin: 3.3 u[IU]/mL (ref 2.0–19.6)

## 2016-01-29 LAB — MICROALBUMIN / CREATININE URINE RATIO: CREATININE, URINE: 8 mg/dL — AB (ref 20–320)

## 2016-01-29 LAB — VITAMIN D 25 HYDROXY (VIT D DEFICIENCY, FRACTURES): VIT D 25 HYDROXY: 81 ng/mL (ref 30–100)

## 2016-02-25 ENCOUNTER — Other Ambulatory Visit: Payer: Self-pay

## 2016-02-25 DIAGNOSIS — Z1212 Encounter for screening for malignant neoplasm of rectum: Secondary | ICD-10-CM

## 2016-02-25 LAB — POC HEMOCCULT BLD/STL (HOME/3-CARD/SCREEN)
FECAL OCCULT BLD: NEGATIVE
FECAL OCCULT BLD: NEGATIVE
FECAL OCCULT BLD: NEGATIVE

## 2016-06-25 ENCOUNTER — Encounter: Payer: Self-pay | Admitting: Internal Medicine

## 2016-07-12 ENCOUNTER — Other Ambulatory Visit: Payer: Self-pay | Admitting: Physician Assistant

## 2016-07-27 NOTE — Progress Notes (Signed)
MEDICARE ANNUAL WELLNESS VISIT AND FOLLOW UP  Assessment:    Hypothyroidism, unspecified hypothyroidism type -cont levothyroxine - TSH   Labile hypertension -cont monitoring -currently managed by diet and exericse - TSH   Hyperlipidemia -diet and exercise as tolerated -will check Lipid panel next visit  Medication management - CBC with Differential/Platelet - BASIC METABOLIC PANEL WITH GFR - Hepatic function panel  Chronic obstructive pulmonary disease, unspecified COPD type ([place]) -cont to monitor -no current treatment needed  Osteoporosis -cont fosamax -Bone Density scan next year   Elevated hemoglobin A1c -check a1c next visit -diet and exercise as tolerated   Vitamin D deficiency -cont Vit D    BMI less than 19,adult -exercise as tolerated -cont increased calorie load  Medicare annual wellness visit, subsequent -due next year   Over 30 minutes of exam, counseling, chart review, and critical decision making was performed Future Appointments Date Time Provider [place]  03/02/2017 10:00 AM Unk Pinto, MD [facility] None     Plan:   During the course of the visit the patient was educated and counseled about appropriate screening and preventive services including:    Pneumococcal vaccine   Influenza vaccine  Td vaccine  Prevnar 13  Screening electrocardiogram  Screening mammography  Bone densitometry screening  Colorectal cancer screening  Diabetes screening  Glaucoma screening  Nutrition counseling   Advanced directives: given info/requested copies   Subjective:   Katie Morton is a 71 y.o. female who presents for Medicare Annual Wellness Visit and 3 month follow up on hypertension, prediabetes, hyperlipidemia, vitamin D def.   Her blood pressure has been controlled at home, today their BP is BP: 120/70 She does workout. She denies chest pain, shortness of breath, dizziness.  She is on cholesterol medication and  denies myalgias. Her cholesterol is at goal. The cholesterol last visit was:   Lab Results  Component Value Date   CHOL 233 (H) 01/28/2016   HDL 74 01/28/2016   LDLCALC 136 (H) 01/28/2016   TRIG 114 01/28/2016   CHOLHDL 3.1 01/28/2016   Patient does have a mild history of prediabetes controlled with diet and exercise.  She notes no changes in weight or in exercise. Lab Results  Component Value Date   HGBA1C 5.5 01/28/2016   Last GFR Lab Results  Component Value Date   GFRNONAA >89 01/28/2016   Patient is on Vitamin D supplement. Lab Results  Component Value Date   VD25OH 67 01/28/2016     Right handed female with swelling/knot at 1st MCP, no numbness, tingling x 5 years.   Medication Review Current Outpatient Prescriptions on File Prior to Visit  Medication Sig Dispense Refill  . alendronate (FOSAMAX) 70 MG tablet TAKE 1 TABLET BY MOUTH EVERY 7 DAYS ON AN EMPTY STOMACH 12 tablet 2  . aspirin EC 81 MG tablet Take 81 mg by mouth daily.    Marland Kitchen BIOTIN PO Take 1 capsule by mouth daily.    . Calcium Citrate-Vitamin D (CALCIUM CITRATE + D PO) Take by mouth daily.    . Cholecalciferol (VITAMIN D PO) Take 2,000 tablets by mouth daily.     Marland Kitchen CINNAMON PO Take 1,000 capsules by mouth 2 (two) times daily.     . fish oil-omega-3 fatty acids 1000 MG capsule Take 1 g by mouth daily.     . Flaxseed, Linseed, (FLAXSEED OIL) 1000 MG CAPS Take 1,000 capsules by mouth daily.    . fluticasone (FLONASE) 50 MCG/ACT nasal spray Place 2 sprays into  both nostrils daily. 16 g 2  . levothyroxine (SYNTHROID, LEVOTHROID) 50 MCG tablet TAKE 1 TABLET DAILY ON AN EMPTY STOMACH FOR 30 MIN 90 tablet 2  . Misc Natural Products (OSTEO BI-FLEX JOINT SHIELD PO) Take 1 tablet by mouth 2 (two) times daily.    . Multiple Vitamins-Minerals (MULTIVITAMIN WITH MINERALS) tablet Take 1 tablet by mouth daily.    . Multiple Vitamins-Minerals (PRESERVISION AREDS PO) Take 1 tablet by mouth daily.    Marland Kitchen OVER THE COUNTER  MEDICATION 2 capsules daily. Tumeric w/ Black pepper extract     No current facility-administered medications on file prior to visit.     Current Problems (verified) Patient Active Problem List   Diagnosis Date Noted  . BMI less than 19,adult 01/23/2015  . Medicare annual wellness visit, subsequent 01/23/2015  . Hypothyroidism 06/11/2014  . Medication management 01/01/2013  . Vitamin D deficiency 01/01/2013  . Screening for malignant neoplasm of the rectum 01/01/2013  . Hyperlipidemia   . Blood pressure elevated without history of HTN   . Abnormal glucose   . Osteoporosis 09/20/2012  . COPD (chronic obstructive pulmonary disease) ([facility])     Screening Tests Immunization History  Administered Date(s) Administered  . Influenza, High Dose Seasonal PF 01/02/2013, 01/07/2014, 01/23/2015, 12/01/2015  . Influenza-Unspecified 12/16/2011  . Pneumococcal Conjugate-13 01/07/2014  . Pneumococcal Polysaccharide-23 08/07/2015  . Pneumococcal-Unspecified 02/23/1995  . Td 02/22/2005, 07/28/2016  . Zoster 12/16/2011    Preventative care: Last colonoscopy: 2011 Last mammogram: 6/17 due DEXA:2015 need repeat, started on fosamax x 2015  Prior vaccinations: TD or Tdap: TODAY Influenza: 2016 Pneumococcal: 2017 Prevnar13: 2015 Shingles/Zostavax: 2013  Names of Other Physician/Practitioners you currently use: 1. [facility]- here for primary care 2. Dr. Leanord Asal, eye doctor, last visit 2016  3. Dr. Gilford Rile, dentist, last visit 2016d Patient Care Team: Unk Pinto, MD as PCP - General (Internal Medicine) Laurence Spates, MD as Consulting Physician (Gastroenterology) Constance Holster, DDS  Allergies No Known Allergies  SURGICAL HISTORY She  has a past surgical history that includes Tonsillectomy; Mandible surgery; and Hip pinning, cannulated (Left, 09/19/2012). FAMILY HISTORY Her family history includes CAD in her maternal grandfather and paternal  grandfather; COPD in her father; Cancer in her maternal aunt and paternal aunt; Cancer - Other in her father; Diabetes in her mother; Heart disease in her brother and mother; Hypertension in her mother; Mitral valve prolapse in her mother; Stroke in her father. SOCIAL HISTORY She  reports that she quit smoking about 38 years ago. She has never used smokeless tobacco. She reports that she drinks about 7.0 oz of alcohol per week . She reports that she does not use drugs. ' MEDICARE WELLNESS OBJECTIVES: Physical activity:   Cardiac risk factors:   Depression/mood screen:   Depression screen [facility] 2/9 01/28/2016  Decreased Interest 0  Down, Depressed, Hopeless 0  PHQ - 2 Score 0    ADLs:  In your present state of health, do you have any difficulty performing the following activities: 01/28/2016 08/07/2015  Hearing? N N  Vision? N N  Difficulty concentrating or making decisions? N N  Walking or climbing stairs? N N  Dressing or bathing? N N  Doing errands, shopping? N N  Preparing Food and eating ? - N  Using the Toilet? - N  In the past six months, have you accidently leaked urine? - N  Do you have problems with loss of bowel control? - N  Managing your Medications? - N  Managing your Finances? - N  Housekeeping or managing your Housekeeping? - N  Some recent data might be hidden     Cognitive Testing  Alert? Yes  Normal Appearance?Yes  Oriented to person? Yes  Place? Yes   Time? Yes  Recall of three objects?  Yes  Can perform simple calculations? Yes  Displays appropriate judgment?Yes  Can read the correct time from a watch face?Yes  EOL planning: Does Patient Have a Medical Advance Directive?: No   Objective:   Today's Vitals   07/28/16 1453  BP: 120/70  Pulse: (!) 55  Resp: 14  Temp: 97.7 F (36.5 C)  SpO2: 99%  Weight: 116 lb 12.8 oz (53 kg)  Height: 5' 7.75" (1.721 m)  PainSc: 0-No pain   Body mass index is 17.89 kg/m.  General appearance: alert, no distress,  WD/WN,  female HEENT: normocephalic, sclerae anicteric, TMs pearly, nares patent, no discharge or erythema, pharynx normal Oral cavity: MMM, no lesions Neck: supple, no lymphadenopathy, no thyromegaly, no masses Heart: RRR, normal S1, S2, no murmurs Lungs: CTA bilaterally, no wheezes, rhonchi, or rales Abdomen: +bs, soft, non tender, non distended, no masses, no hepatomegaly, no splenomegaly Musculoskeletal: nontender, no swelling, no obvious deformity, right 1st MCP knot/nontender, decrease extension.  Extremities: no edema, no cyanosis, no clubbing Pulses: 2+ symmetric, upper and lower extremities, normal cap refill Neurological: alert, oriented x 3, CN2-12 intact, strength normal upper extremities and lower extremities, sensation normal throughout, DTRs 2+ throughout, no cerebellar signs, gait normal Psychiatric: normal affect, behavior normal, pleasant  Breast: defer Gyn: defer Rectal: defer   Medicare Attestation I have personally reviewed: The patient's medical and social history Their use of alcohol, tobacco or illicit drugs Their current medications and supplements The patient's functional ability including ADLs,fall risks, home safety risks, cognitive, and hearing and visual impairment Diet and physical activities Evidence for depression or mood disorders  The patient's weight, height, BMI, and visual acuity have been recorded in the chart.  I have made referrals, counseling, and provided education to the patient based on review of the above and I have provided the patient with a written personalized care plan for preventive services.     Vicie Mutters, PA-C   07/28/2016

## 2016-07-28 ENCOUNTER — Ambulatory Visit: Payer: Self-pay | Admitting: Internal Medicine

## 2016-07-28 ENCOUNTER — Encounter: Payer: Self-pay | Admitting: Physician Assistant

## 2016-07-28 ENCOUNTER — Ambulatory Visit (INDEPENDENT_AMBULATORY_CARE_PROVIDER_SITE_OTHER): Payer: Medicare Other | Admitting: Physician Assistant

## 2016-07-28 VITALS — BP 120/70 | HR 55 | Temp 97.7°F | Resp 14 | Ht 67.75 in | Wt 116.8 lb

## 2016-07-28 DIAGNOSIS — Z23 Encounter for immunization: Secondary | ICD-10-CM

## 2016-07-28 DIAGNOSIS — Z0000 Encounter for general adult medical examination without abnormal findings: Secondary | ICD-10-CM

## 2016-07-28 DIAGNOSIS — J449 Chronic obstructive pulmonary disease, unspecified: Secondary | ICD-10-CM

## 2016-07-28 DIAGNOSIS — R030 Elevated blood-pressure reading, without diagnosis of hypertension: Secondary | ICD-10-CM

## 2016-07-28 DIAGNOSIS — Z681 Body mass index (BMI) 19 or less, adult: Secondary | ICD-10-CM

## 2016-07-28 DIAGNOSIS — E039 Hypothyroidism, unspecified: Secondary | ICD-10-CM

## 2016-07-28 DIAGNOSIS — M810 Age-related osteoporosis without current pathological fracture: Secondary | ICD-10-CM

## 2016-07-28 DIAGNOSIS — E782 Mixed hyperlipidemia: Secondary | ICD-10-CM

## 2016-07-28 DIAGNOSIS — R7309 Other abnormal glucose: Secondary | ICD-10-CM

## 2016-07-28 DIAGNOSIS — E2839 Other primary ovarian failure: Secondary | ICD-10-CM

## 2016-07-28 DIAGNOSIS — Z0001 Encounter for general adult medical examination with abnormal findings: Secondary | ICD-10-CM | POA: Diagnosis not present

## 2016-07-28 DIAGNOSIS — R6889 Other general symptoms and signs: Secondary | ICD-10-CM | POA: Diagnosis not present

## 2016-07-28 DIAGNOSIS — E559 Vitamin D deficiency, unspecified: Secondary | ICD-10-CM

## 2016-07-28 DIAGNOSIS — Z1212 Encounter for screening for malignant neoplasm of rectum: Secondary | ICD-10-CM

## 2016-07-28 DIAGNOSIS — Z79899 Other long term (current) drug therapy: Secondary | ICD-10-CM

## 2016-07-28 LAB — CBC WITH DIFFERENTIAL/PLATELET
BASOS ABS: 59 {cells}/uL (ref 0–200)
Basophils Relative: 1 %
EOS PCT: 7 %
Eosinophils Absolute: 413 cells/uL (ref 15–500)
HCT: 39.4 % (ref 35.0–45.0)
Hemoglobin: 12.7 g/dL (ref 11.7–15.5)
Lymphocytes Relative: 34 %
Lymphs Abs: 2006 cells/uL (ref 850–3900)
MCH: 29.9 pg (ref 27.0–33.0)
MCHC: 32.2 g/dL (ref 32.0–36.0)
MCV: 92.7 fL (ref 80.0–100.0)
MONOS PCT: 9 %
MPV: 10.9 fL (ref 7.5–12.5)
Monocytes Absolute: 531 cells/uL (ref 200–950)
NEUTROS PCT: 49 %
Neutro Abs: 2891 cells/uL (ref 1500–7800)
PLATELETS: 309 10*3/uL (ref 140–400)
RBC: 4.25 MIL/uL (ref 3.80–5.10)
RDW: 14 % (ref 11.0–15.0)
WBC: 5.9 10*3/uL (ref 3.8–10.8)

## 2016-07-28 NOTE — Patient Instructions (Signed)
Osteoporosis Osteoporosis is the thinning and loss of density in the bones. Osteoporosis makes the bones more brittle, fragile, and likely to break (fracture). Over time, osteoporosis can cause the bones to become so weak that they fracture after a simple fall. The bones most likely to fracture are the bones in the hip, wrist, and spine. What are the causes? The exact cause is not known. What increases the risk? Anyone can develop osteoporosis. You may be at greater risk if you have a family history of the condition or have poor nutrition. You may also have a higher risk if you are:  Female.  69 years old or older.  A smoker.  Not physically active.  White or Asian.  Slender.  What are the signs or symptoms? A fracture might be the first sign of the disease, especially if it results from a fall or injury that would not usually cause a bone to break. Other signs and symptoms include:  Low back and neck pain.  Stooped posture.  Height loss.  How is this diagnosed? To make a diagnosis, your health care provider may:  Take a medical history.  Perform a physical exam.  Order tests, such as: ? A bone mineral density test. ? A dual-energy X-ray absorptiometry test.  How is this treated? The goal of osteoporosis treatment is to strengthen your bones to reduce your risk of a fracture. Treatment may involve:  Making lifestyle changes, such as: ? Eating a diet rich in calcium. ? Doing weight-bearing and muscle-strengthening exercises. ? Stopping tobacco use. ? Limiting alcohol intake.  Taking medicine to slow the process of bone loss or to increase bone density.  Monitoring your levels of calcium and vitamin D.  Follow these instructions at home:  Include calcium and vitamin D in your diet. Calcium is important for bone health, and vitamin D helps the body absorb calcium.  Perform weight-bearing and muscle-strengthening exercises as directed by your health care  provider.  Do not use any tobacco products, including cigarettes, chewing tobacco, and electronic cigarettes. If you need help quitting, ask your health care provider.  Limit your alcohol intake.  Take medicines only as directed by your health care provider.  Keep all follow-up visits as directed by your health care provider. This is important.  Take precautions at home to lower your risk of falling, such as: ? Keeping rooms well lit and clutter free. ? Installing safety rails on stairs. ? Using rubber mats in the bathroom and other areas that are often wet or slippery. Get help right away if: You fall or injure yourself. This information is not intended to replace advice given to you by your health care provider. Make sure you discuss any questions you have with your health care provider. Document Released: 11/18/2004 Document Revised: 07/14/2015 Document Reviewed: 07/19/2013 Elsevier Interactive Patient Education  2017 [employer].  Alendronate tablets What is this medicine? ALENDRONATE (a LEN droe nate) slows calcium loss from bones. It helps to make normal healthy bone and to slow bone loss in people with Paget's disease and osteoporosis. It may be used in others at risk for bone loss. This medicine may be used for other purposes; ask your health care provider or pharmacist if you have questions. COMMON BRAND NAME(S): Fosamax What should I tell my health care provider before I take this medicine? They need to know if you have any of these conditions: -dental disease -esophagus, stomach, or intestine problems, like acid reflux or GERD -kidney disease -low  blood calcium -low vitamin D -problems sitting or standing 30 minutes -trouble swallowing -an unusual or allergic reaction to alendronate, other medicines, foods, dyes, or preservatives -pregnant or trying to get pregnant -breast-feeding How should I use this medicine? You must take this medicine exactly as directed or you  will lower the amount of the medicine you absorb into your body or you may cause yourself harm. Take this medicine by mouth first thing in the morning, after you are up for the day. Do not eat or drink anything before you take your medicine. Swallow the tablet with a full glass (6 to 8 fluid ounces) of plain water. Do not take this medicine with any other drink. Do not chew or crush the tablet. After taking this medicine, do not eat breakfast, drink, or take any medicines or vitamins for at least 30 minutes. Sit or stand up for at least 30 minutes after you take this medicine; do not lie down. Do not take your medicine more often than directed. Talk to your pediatrician regarding the use of this medicine in children. Special care may be needed. Overdosage: If you think you have taken too much of this medicine contact a poison control center or emergency room at once. NOTE: This medicine is only for you. Do not share this medicine with others. What if I miss a dose? If you miss a dose, do not take it later in the day. Continue your normal schedule starting the next morning. Do not take double or extra doses. What may interact with this medicine? -aluminum hydroxide -antacids -aspirin -calcium supplements -drugs for inflammation like ibuprofen, naproxen, and others -iron supplements -magnesium supplements -vitamins with minerals This list may not describe all possible interactions. Give your health care provider a list of all the medicines, herbs, non-prescription drugs, or dietary supplements you use. Also tell them if you smoke, drink alcohol, or use illegal drugs. Some items may interact with your medicine. What should I watch for while using this medicine? Visit your doctor or health care professional for regular checks ups. It may be some time before you see benefit from this medicine. Do not stop taking your medicine except on your doctor's advice. Your doctor or health care professional may  order blood tests and other tests to see how you are doing. You should make sure you get enough calcium and vitamin D while you are taking this medicine, unless your doctor tells you not to. Discuss the foods you eat and the vitamins you take with your health care professional. Some people who take this medicine have severe bone, joint, and/or muscle pain. This medicine may also increase your risk for a broken thigh bone. Tell your doctor right away if you have pain in your upper leg or groin. Tell your doctor if you have any pain that does not go away or that gets worse. This medicine can make you more sensitive to the sun. If you get a rash while taking this medicine, sunlight may cause the rash to get worse. Keep out of the sun. If you cannot avoid being in the sun, wear protective clothing and use sunscreen. Do not use sun lamps or tanning beds/booths. What side effects may I notice from receiving this medicine? Side effects that you should report to your doctor or health care professional as soon as possible: -allergic reactions like skin rash, itching or hives, swelling of the face, lips, or tongue -black or tarry stools -bone, muscle or joint pain -changes in  vision -chest pain -heartburn or stomach pain -jaw pain, especially after dental work -pain or trouble when swallowing -redness, blistering, peeling or loosening of the skin, including inside the mouth Side effects that usually do not require medical attention (report to your doctor or health care professional if they continue or are bothersome): -changes in taste -diarrhea or constipation -eye pain or itching -headache -nausea or vomiting -stomach gas or fullness This list may not describe all possible side effects. Call your doctor for medical advice about side effects. You may report side effects to FDA at 1-800-FDA-1088. Where should I keep my medicine? Keep out of the reach of children. Store at room temperature of 15 and 30  degrees C (59 and 86 degrees F). Throw away any unused medicine after the expiration date. NOTE: This sheet is a summary. It may not cover all possible information. If you have questions about this medicine, talk to your doctor, pharmacist, or health care provider.  2018 Elsevier/Gold Standard (2010-08-07 08:56:09)

## 2016-07-29 LAB — LIPID PANEL
CHOL/HDL RATIO: 2.8 ratio (ref <5.0)
Cholesterol: 207 mg/dL — ABNORMAL HIGH (ref <200)
HDL: 73 mg/dL (ref >50)
LDL Cholesterol: 106 mg/dL — ABNORMAL HIGH (ref <100)
Triglycerides: 141 mg/dL (ref <150)
VLDL: 28 mg/dL (ref <30)

## 2016-07-29 LAB — HEPATIC FUNCTION PANEL
ALBUMIN: 4 g/dL (ref 3.6–5.1)
ALT: 17 U/L (ref 6–29)
AST: 17 U/L (ref 10–35)
Alkaline Phosphatase: 64 U/L (ref 33–130)
BILIRUBIN DIRECT: 0.1 mg/dL (ref <=0.2)
BILIRUBIN TOTAL: 0.5 mg/dL (ref 0.2–1.2)
Indirect Bilirubin: 0.4 mg/dL (ref 0.2–1.2)
Total Protein: 6.9 g/dL (ref 6.1–8.1)

## 2016-07-29 LAB — BASIC METABOLIC PANEL WITH GFR
BUN: 14 mg/dL (ref 7–25)
CALCIUM: 9 mg/dL (ref 8.6–10.4)
CO2: 25 mmol/L (ref 20–31)
CREATININE: 0.63 mg/dL (ref 0.50–0.99)
Chloride: 104 mmol/L (ref 98–110)
GFR, Est African American: >89 mL/min (ref >=60)
GFR, Est Non African American: >89 mL/min (ref >=60)
Glucose, Bld: 93 mg/dL (ref 65–99)
Potassium: 4.1 mmol/L (ref 3.5–5.3)
SODIUM: 140 mmol/L (ref 135–146)

## 2016-07-29 LAB — TSH: TSH: 3.37 m[IU]/L

## 2016-08-06 ENCOUNTER — Other Ambulatory Visit: Payer: Self-pay | Admitting: Physician Assistant

## 2016-08-06 DIAGNOSIS — Z1231 Encounter for screening mammogram for malignant neoplasm of breast: Secondary | ICD-10-CM

## 2016-08-23 ENCOUNTER — Ambulatory Visit
Admission: RE | Admit: 2016-08-23 | Discharge: 2016-08-23 | Disposition: A | Discharge status: Home / Self Care | Payer: Medicare Other | Source: Ambulatory Visit | Attending: Physician Assistant | Admitting: Physician Assistant

## 2016-08-23 DIAGNOSIS — E2839 Other primary ovarian failure: Secondary | ICD-10-CM

## 2016-08-23 DIAGNOSIS — Z1231 Encounter for screening mammogram for malignant neoplasm of breast: Secondary | ICD-10-CM

## 2016-08-23 DIAGNOSIS — M810 Age-related osteoporosis without current pathological fracture: Secondary | ICD-10-CM

## 2016-08-23 HISTORY — DX: Malignant (primary) neoplasm, unspecified: C80.1

## 2016-08-24 ENCOUNTER — Other Ambulatory Visit: Payer: Self-pay | Admitting: *Deleted

## 2016-08-24 ENCOUNTER — Other Ambulatory Visit: Payer: Self-pay | Admitting: Physician Assistant

## 2016-08-24 ENCOUNTER — Encounter: Payer: Self-pay | Admitting: Physician Assistant

## 2016-08-24 MED ORDER — AMOXICILLIN-POT CLAVULANATE 875-125 MG PO TABS: 1.0000 | ORAL_TABLET | Freq: Two times a day (BID) | ORAL | 0 refills | Status: DC

## 2016-09-10 ENCOUNTER — Encounter: Payer: Self-pay | Admitting: Internal Medicine

## 2016-09-10 ENCOUNTER — Ambulatory Visit (INDEPENDENT_AMBULATORY_CARE_PROVIDER_SITE_OTHER): Payer: Medicare Other | Admitting: Internal Medicine

## 2016-09-10 VITALS — BP 120/64 | HR 80 | Temp 98.2°F | Resp 18 | Ht 67.75 in | Wt 115.4 lb

## 2016-09-10 DIAGNOSIS — D485 Neoplasm of uncertain behavior of skin: Secondary | ICD-10-CM

## 2016-09-10 DIAGNOSIS — R030 Elevated blood-pressure reading, without diagnosis of hypertension: Secondary | ICD-10-CM | POA: Diagnosis not present

## 2016-09-10 NOTE — Progress Notes (Signed)
  Subjective:    Patient ID: Katie Morton, female    DOB: [birth year], 71 y.o.   MRN: [id number]  HPI  This very nice 71 yo MWF with hx/o labile HTN presents for recheck and BP is normal as well as hypertensive system review. Patient also expressed concern re: a rapidly growing skin lesion of the distal anterior Rt thigh.   Medication Sig  . alendronate (FOSAMAX) 70 MG tablet TAKE 1 TABLET BY MOUTH EVERY 7 DAYS ON AN EMPTY STOMACH  . aspirin EC 81 MG tablet Take 81 mg by mouth daily.  Marland Kitchen BIOTIN PO Take 1 capsule by mouth daily.  . Calcium Citrate-Vitamin D  Take by mouth daily.  Marland Kitchen VITAMIN D Take 2,000 tablets by mouth daily.   Marland Kitchen CINNAMON Take 1,000 capsules by mouth 2 (two) times daily.   . fish oil-omega 1000 MG cap Take 1 g by mouth daily.   Marland Kitchen FLAXSEED OIL 1000 MG  Take 1,000 capsules by mouth daily.  Marland Kitchen levothyroxine 50 MCG TAKE 1 TABLET DAILY   . OSTEO BI-FLEX  Take 1 tablet by mouth 2 (two) times daily.  . Multiple Vitamins-Minerals  Take 1 tablet by mouth daily.  Marland Kitchen PRESERVISION AREDS Take 1 tablet by mouth daily.  Carlynn Purl w/ Black pepper ext 2 capsules daily.   Marland Kitchen FLONASE  nasal spray Place 2 sprays into both nostrils daily.   No Known Allergies  Past Medical History:  Diagnosis Date  . Arthritis   . Cancer ([place]) 2016   squamous cell skin cancer   . COPD (chronic obstructive pulmonary disease) ([place])   . Elevated hemoglobin A1c   . Hyperlipidemia   . Labile hypertension   . Osteoporosis   . Villous adenoma 2008   Review of Systems  10 point systems review negative except as above    Objective:   Physical Exam  BP 120/64   Pulse 80   Temp 98.2 F (36.8 C)   Resp 18   Ht 5' 7.75" (1.721 m)   Wt 115 lb 6.4 oz (52.3 kg)   BMI 17.68 kg/m    HEENT - WNL. Neck - supple.  Chest - Clear equal BS. Cor - Nl HS. RRR w/o sig MGR. PP 1(+). No edema. MS- FROM w/o deformities.  Gait Nl. Neuro -  Nl w/o focal abnormalities. Skin - there is a dark pink to red 5 x 5 mm  raised lesion of the distal anterior Rt thigh.   Procedure (CPT- 11400): After informed consent and aseptic prep the above lesion was anesthetized w/0.5 ml of Lidocaine 1% . Then with a #10 scalpel the lesion was excised full thickness in an elliptical fashion. Then the wound edges were approximated with # 3 vertical mattress sutures of Nylon 3-0. Then the opposing were aligned, everted and secured with # 2 interrupted sutures of proline 3-0. Antibiotic oint applied and 4 " x 6" Tegaderm applied and patient instructed in wound care.    Assessment & Plan:   1. Blood pressure elevated without history of HTN   2. Neoplasm of uncertain behavior of skin of thigh, r/o SCC  - Procedure - CPT :409811  - Dermatology pathology

## 2016-10-21 LAB — HM COLONOSCOPY

## 2016-12-02 LAB — HM DIABETES EYE EXAM

## 2016-12-22 ENCOUNTER — Encounter: Payer: Self-pay | Admitting: Internal Medicine

## 2017-03-02 ENCOUNTER — Encounter: Payer: Self-pay | Admitting: Internal Medicine

## 2017-03-02 ENCOUNTER — Ambulatory Visit: Payer: Medicare Other | Admitting: Internal Medicine

## 2017-03-02 VITALS — BP 122/68 | HR 60 | Temp 97.2°F | Resp 16 | Ht 67.5 in | Wt 118.6 lb

## 2017-03-02 DIAGNOSIS — Z136 Encounter for screening for cardiovascular disorders: Secondary | ICD-10-CM | POA: Diagnosis not present

## 2017-03-02 DIAGNOSIS — Z0001 Encounter for general adult medical examination with abnormal findings: Secondary | ICD-10-CM

## 2017-03-02 DIAGNOSIS — Z0000 Encounter for general adult medical examination without abnormal findings: Secondary | ICD-10-CM

## 2017-03-02 DIAGNOSIS — Z79899 Other long term (current) drug therapy: Secondary | ICD-10-CM

## 2017-03-02 DIAGNOSIS — R0989 Other specified symptoms and signs involving the circulatory and respiratory systems: Secondary | ICD-10-CM

## 2017-03-02 DIAGNOSIS — E782 Mixed hyperlipidemia: Secondary | ICD-10-CM

## 2017-03-02 DIAGNOSIS — I10 Essential (primary) hypertension: Secondary | ICD-10-CM | POA: Diagnosis not present

## 2017-03-02 DIAGNOSIS — R7303 Prediabetes: Secondary | ICD-10-CM

## 2017-03-02 DIAGNOSIS — R7309 Other abnormal glucose: Secondary | ICD-10-CM

## 2017-03-02 DIAGNOSIS — Z1211 Encounter for screening for malignant neoplasm of colon: Secondary | ICD-10-CM

## 2017-03-02 DIAGNOSIS — E039 Hypothyroidism, unspecified: Secondary | ICD-10-CM

## 2017-03-02 DIAGNOSIS — Z1212 Encounter for screening for malignant neoplasm of rectum: Secondary | ICD-10-CM

## 2017-03-02 DIAGNOSIS — E559 Vitamin D deficiency, unspecified: Secondary | ICD-10-CM

## 2017-03-02 DIAGNOSIS — M810 Age-related osteoporosis without current pathological fracture: Secondary | ICD-10-CM

## 2017-03-02 NOTE — Progress Notes (Signed)
[facility] Unk Pinto, M.D.     Uvaldo Bristle. Silverio Lay, P.A.-C Liane Comber, [place] [address] [place], [place] [address] Telephone [phone] Telefax [phone] Annual Screening/Preventative Visit & Comprehensive Evaluation &  Examination     This very nice 72 y.o. MWF presents for a Screening/Preventative Visit & comprehensive evaluation and management of multiple medical co-morbidities.  Patient has been followed for labile HTN, Prediabetes, Hyperlipidemia and Vitamin D Deficiency. 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 Patient also has been on Fosa,max 4-5 years for Osteoporosis and discussed taking a drug holiday.       HTN predates since 2012. Patient's BP has been controlled at home and patient denies any cardiac symptoms as chest pain, palpitations, shortness of breath, dizziness or ankle swelling. Today's BP is at goal - 122/68      Patient's hyperlipidemia is not controlled with diet and Fish Oil supplements.  Last lipids were near goal: Lab Results  Component Value Date   CHOL 207 (H) 07/28/2016   HDL 73 07/28/2016   LDLCALC 106 (H) 07/28/2016   TRIG 141 07/28/2016   CHOLHDL 2.8 07/28/2016      Patient has prediabetes (A1c 5.7%/2012 and 6.0%/2016)  and patient denies reactive hypoglycemic symptoms, visual blurring, diabetic polys, or paresthesias. Last A1c was Normal & at goal: Lab Results  Component Value Date   HGBA1C 5.5 01/28/2016      Patient was dx'd Hypothyroid in 2015 and has been on Thyroid Replacement since.  Finally, patient has history of Vitamin D Deficiency and last Vitamin D was at goal: Lab Results  Component Value Date   VD25OH 81 01/28/2016   Current Outpatient Medications on File Prior to Visit  Medication Sig  . alendronate (FOSAMAX) 70 MG tablet TAKE 1 TABLET BY MOUTH EVERY 7 DAYS ON AN EMPTY STOMACH  . aspirin EC 81 MG tablet Take 81 mg by  mouth daily.  Marland Kitchen BIOTIN PO Take 1 capsule by mouth daily.  . Calcium Citrate-Vitamin D (CALCIUM CITRATE + D PO) Take by mouth daily.  . Cholecalciferol (VITAMIN D PO) Take 2,000 tablets by mouth daily.   Marland Kitchen CINNAMON PO Take 1,000 capsules by mouth 2 (two) times daily.   . dorzolamide (TRUSOPT) 2 % ophthalmic solution INSTILL 1 DROP INTO RIGHT EYE TWICE A DAY  . fish oil-omega-3 fatty acids 1000 MG capsule Take 1 g by mouth daily.   . Flaxseed, Linseed, (FLAXSEED OIL) 1000 MG CAPS Take 1,000 capsules by mouth daily.  Marland Kitchen latanoprost (XALATAN) 0.005 % ophthalmic solution INSTILL 1 DROP INTO BOTH EYES EVERY DAY AT NIGHT  . levothyroxine (SYNTHROID, LEVOTHROID) 50 MCG tablet TAKE 1 TABLET DAILY ON AN EMPTY STOMACH FOR 30 MIN  . Misc Natural Products (OSTEO BI-FLEX JOINT SHIELD PO) Take 1 tablet by mouth 2 (two) times daily.  . Multiple Vitamins-Minerals (MULTIVITAMIN WITH MINERALS) tablet Take 1 tablet by mouth daily.  . Multiple Vitamins-Minerals (PRESERVISION AREDS PO) Take 1 tablet by mouth daily.  Marland Kitchen OVER THE COUNTER MEDICATION 2 capsules daily. Tumeric w/ Black pepper extract   No current facility-administered medications on file prior to visit.    Allergies  Allergen Reactions  . Silver Sulfadiazine    Past Medical History:  Diagnosis Date  . Arthritis   . Cancer ([facility]) 2016   squamous cell skin cancer   . COPD (chronic obstructive pulmonary disease) ([facility])   . Elevated hemoglobin A1c   . Hyperlipidemia   .  Labile hypertension   . Osteoporosis   . Villous adenoma 2008   Health Maintenance  Topic Date Due  . MAMMOGRAM  08/24/2018  . COLONOSCOPY  10/15/2024  . TETANUS/TDAP  07/29/2026  . INFLUENZA VACCINE  Completed  . DEXA SCAN  Completed  . Hepatitis C Screening  Completed  . PNA vac Low Risk Adult  Completed   Immunization History  Administered Date(s) Administered  . Influenza, High Dose Seasonal PF 01/02/2013, 01/07/2014, 01/23/2015, 12/01/2015  . Influenza-Unspecified  12/16/2011, 12/23/2016  . Pneumococcal Conjugate-13 01/07/2014  . Pneumococcal Polysaccharide-23 08/07/2015  . Pneumococcal-Unspecified 02/23/1995  . Td 02/22/2005, 07/28/2016  . Zoster 12/16/2011   Last Colon -  Last Pap -  Past Surgical History:  Procedure Laterality Date  . HIP PINNING,CANNULATED Left 09/19/2012   Procedure: Percutaneous Screw Fixation Left Hip Fracture;  Surgeon: Nita Sells, MD;  Location: [facility];  Service: Orthopedics;  Laterality: Left;  Marland Kitchen MANDIBLE SURGERY    . TONSILLECTOMY     Family History  Problem Relation Age of Onset  . Hypertension Mother   . Diabetes Mother   . Mitral valve prolapse Mother   . Heart disease Mother   . Cancer - Other Father        Leukemia and Melanoma  . COPD Father   . Stroke Father   . CAD Paternal Grandfather   . CAD Maternal Grandfather   . Heart disease Brother   . Cancer Maternal Aunt        colon  . Cancer Paternal Aunt        colon   Social History   Tobacco Use  . Smoking status: Former Smoker    Last attempt to quit: 02/22/1978    Years since quitting: 39.0  . Smokeless tobacco: Never Used  Substance Use Topics  . Alcohol use: Yes    Alcohol/week: 7.0 oz    Types: 14 Standard drinks or equivalent per week  . Drug use: No    ROS Constitutional: Denies fever, chills, weight loss/gain, headaches, insomnia,  night sweats, and change in appetite. Does c/o fatigue. Eyes: Denies redness, blurred vision, diplopia, discharge, itchy, watery eyes.  ENT: Denies discharge, congestion, post nasal drip, epistaxis, sore throat, earache, hearing loss, dental pain, Tinnitus, Vertigo, Sinus pain, snoring.  Cardio: Denies chest pain, palpitations, irregular heartbeat, syncope, dyspnea, diaphoresis, orthopnea, PND, claudication, edema Respiratory: denies cough, dyspnea, DOE, pleurisy, hoarseness, laryngitis, wheezing.  Gastrointestinal: Denies dysphagia, heartburn, reflux, water brash, pain, cramps, nausea, vomiting,  bloating, diarrhea, constipation, hematemesis, melena, hematochezia, jaundice, hemorrhoids Genitourinary: Denies dysuria, frequency, urgency, nocturia, hesitancy, discharge, hematuria, flank pain Breast: Breast lumps, nipple discharge, bleeding.  Musculoskeletal: Denies arthralgia, myalgia, stiffness, Jt. Swelling, pain, limp, and strain/sprain. Denies falls. Skin: Denies puritis, rash, hives, warts, acne, eczema, changing in skin lesion Neuro: No weakness, tremor, incoordination, spasms, paresthesia, pain Psychiatric: Denies confusion, memory loss, sensory loss. Denies Depression. Endocrine: Denies change in weight, skin, hair change, nocturia, and paresthesia, diabetic polys, visual blurring, hyper / hypo glycemic episodes.  Heme/Lymph: No excessive bleeding, bruising, enlarged lymph nodes.  Physical Exam  BP 122/68   Pulse 60   Temp (!) 97.2 F (36.2 C)   Resp 16   Ht 5' 7.5" (1.715 m)   Wt 118 lb 9.6 oz (53.8 kg)   BMI 18.30 kg/m   General Appearance: Well nourished, well groomed and in no apparent distress.  Eyes: PERRLA, EOMs, conjunctiva no swelling or erythema, normal fundi and vessels. Sinuses: No frontal/maxillary tenderness ENT/Mouth:  EACs patent / TMs  nl. Nares clear without erythema, swelling, mucoid exudates. Oral hygiene is good. No erythema, swelling, or exudate. Tongue normal, non-obstructing. Tonsils not swollen or erythematous. Hearing normal.  Neck: Supple, thyroid normal. No bruits, nodes or JVD. Respiratory: Respiratory effort normal.  BS equal and clear bilateral without rales, rhonci, wheezing or stridor. Cardio: Heart sounds are normal with regular rate and rhythm and no murmurs, rubs or gallops. Peripheral pulses are normal and equal bilaterally without edema. No aortic or femoral bruits. Chest: symmetric with normal excursions and percussion. Breasts: Symmetric, without lumps, nipple discharge, retractions, or fibrocystic changes.  Abdomen: Flat, soft with  bowel sounds active. Nontender, no guarding, rebound, hernias, masses, or organomegaly.  Lymphatics: Non tender without lymphadenopathy.  Musculoskeletal: Full ROM all peripheral extremities, joint stability, 5/5 strength, and normal gait. Skin: Warm and dry without rashes, lesions, cyanosis, clubbing or  ecchymosis.  Neuro: Cranial nerves intact, reflexes equal bilaterally. Normal muscle tone, no cerebellar symptoms. Sensation intact.  Pysch: Alert and oriented X 3, normal affect, Insight and Judgment appropriate.   Assessment and Plan  1. Annual Preventative Screening Examination  2. Labile hypertension  - EKG 12-Lead - Urinalysis, Routine w reflex microscopic - Microalbumin / creatinine urine ratio - CBC with Differential/Platelet - BASIC METABOLIC PANEL WITH GFR - Magnesium - TSH  3. Hyperlipidemia, mixed  - EKG 12-Lead - Hepatic function panel - Lipid panel - TSH  4. Prediabetes  - EKG 12-Lead - Hemoglobin A1c - Insulin, random  5. Vitamin D deficiency  - VITAMIN D 25 Hydroxy   6. Abnormal glucose  - Hemoglobin A1c - Insulin, random  7. Hypothyroidism  - TSH  8. Screening for ischemic heart disease  - EKG 12-Lead  9. Osteoporosis   10. Screening for colorectal cancer  - POC Hemoccult Bld/Stl   11. Medication management  - Urinalysis, Routine w reflex microscopic - Microalbumin / creatinine urine ratio - CBC with Differential/Platelet - BASIC METABOLIC PANEL WITH GFR - Hepatic function panel - Magnesium - Lipid panel - TSH - Hemoglobin A1c - Insulin, random - VITAMIN D 25 Hydroxy         Patient was counseled in prudent diet to achieve/maintain BMI less than 25 for weight control, BP monitoring, regular exercise and medications. Discussed med's effects and SE's. Screening labs and tests as requested with regular follow-up as recommended. Over 40 minutes of exam, counseling, chart review and high complex critical decision making was  performed.

## 2017-03-02 NOTE — Patient Instructions (Signed)

## 2017-03-03 LAB — URINALYSIS, ROUTINE W REFLEX MICROSCOPIC
Bilirubin Urine: NEGATIVE
Glucose, UA: NEGATIVE
Hgb urine dipstick: NEGATIVE
Ketones, ur: NEGATIVE
Leukocytes, UA: NEGATIVE
NITRITE: NEGATIVE
PH: 6.5 (ref 5.0–8.0)
Protein, ur: NEGATIVE
SPECIFIC GRAVITY, URINE: 1.003 (ref 1.001–1.03)

## 2017-03-03 LAB — CBC WITH DIFFERENTIAL/PLATELET
BASOS PCT: 0.8 %
Basophils Absolute: 90 cells/uL (ref 0–200)
Eosinophils Absolute: 520 cells/uL — ABNORMAL HIGH (ref 15–500)
Eosinophils Relative: 4.6 %
HEMATOCRIT: 40.8 % (ref 35.0–45.0)
HEMOGLOBIN: 13.5 g/dL (ref 11.7–15.5)
LYMPHS ABS: 2124 {cells}/uL (ref 850–3900)
MCH: 29.9 pg (ref 27.0–33.0)
MCHC: 33.1 g/dL (ref 32.0–36.0)
MCV: 90.3 fL (ref 80.0–100.0)
MONOS PCT: 6.5 %
MPV: 11.1 fL (ref 7.5–12.5)
NEUTROS ABS: 7831 {cells}/uL — AB (ref 1500–7800)
Neutrophils Relative %: 69.3 %
Platelets: 296 10*3/uL (ref 140–400)
RBC: 4.52 10*6/uL (ref 3.80–5.10)
RDW: 11.8 % (ref 11.0–15.0)
Total Lymphocyte: 18.8 %
WBC: 11.3 10*3/uL — ABNORMAL HIGH (ref 3.8–10.8)
WBCMIX: 735 {cells}/uL (ref 200–950)

## 2017-03-03 LAB — HEPATIC FUNCTION PANEL
AG RATIO: 1.4 (calc) (ref 1.0–2.5)
ALT: 17 U/L (ref 6–29)
AST: 18 U/L (ref 10–35)
Albumin: 4.4 g/dL (ref 3.6–5.1)
Alkaline phosphatase (APISO): 63 U/L (ref 33–130)
BILIRUBIN INDIRECT: 0.5 mg/dL (ref 0.2–1.2)
Bilirubin, Direct: 0.1 mg/dL (ref 0.0–0.2)
GLOBULIN: 3.1 g/dL (ref 1.9–3.7)
TOTAL PROTEIN: 7.5 g/dL (ref 6.1–8.1)
Total Bilirubin: 0.6 mg/dL (ref 0.2–1.2)

## 2017-03-03 LAB — BASIC METABOLIC PANEL WITH GFR
BUN: 12 mg/dL (ref 7–25)
CO2: 29 mmol/L (ref 20–32)
CREATININE: 0.62 mg/dL (ref 0.50–0.99)
Calcium: 9.5 mg/dL (ref 8.6–10.4)
Chloride: 100 mmol/L (ref 98–110)
GFR, Est African American: 107 mL/min/{1.73_m2} (ref >=60)
GFR, Est Non African American: 92 mL/min/{1.73_m2} (ref >=60)
GLUCOSE: 90 mg/dL (ref 65–99)
Potassium: 4 mmol/L (ref 3.5–5.3)
Sodium: 138 mmol/L (ref 135–146)

## 2017-03-03 LAB — HEMOGLOBIN A1C
HEMOGLOBIN A1C: 5.4 %{Hb} (ref <5.7)
MEAN PLASMA GLUCOSE: 108 (calc)
eAG (mmol/L): 6 (calc)

## 2017-03-03 LAB — VITAMIN D 25 HYDROXY (VIT D DEFICIENCY, FRACTURES): VIT D 25 HYDROXY: 100 ng/mL (ref 30–100)

## 2017-03-03 LAB — LIPID PANEL
CHOL/HDL RATIO: 2.7 (calc) (ref <5.0)
CHOLESTEROL: 220 mg/dL — AB (ref <200)
HDL: 83 mg/dL (ref >50)
LDL CHOLESTEROL (CALC): 113 mg/dL — AB
Non-HDL Cholesterol (Calc): 137 mg/dL (calc) — ABNORMAL HIGH (ref <130)
Triglycerides: 126 mg/dL (ref <150)

## 2017-03-03 LAB — MICROALBUMIN / CREATININE URINE RATIO
Creatinine, Urine: 9 mg/dL — ABNORMAL LOW (ref 20–275)
Microalb, Ur: <0.2 mg/dL

## 2017-03-03 LAB — INSULIN, RANDOM: Insulin: 31.8 u[IU]/mL — ABNORMAL HIGH (ref 2.0–19.6)

## 2017-03-03 LAB — MAGNESIUM: Magnesium: 2.2 mg/dL (ref 1.5–2.5)

## 2017-03-03 LAB — TSH: TSH: 4.71 mIU/L — ABNORMAL HIGH (ref 0.40–4.50)

## 2017-03-16 ENCOUNTER — Other Ambulatory Visit: Payer: Self-pay

## 2017-03-16 DIAGNOSIS — Z1212 Encounter for screening for malignant neoplasm of rectum: Principal | ICD-10-CM

## 2017-03-16 DIAGNOSIS — Z1211 Encounter for screening for malignant neoplasm of colon: Secondary | ICD-10-CM

## 2017-03-16 LAB — POC HEMOCCULT BLD/STL (HOME/3-CARD/SCREEN)
FECAL OCCULT BLD: NEGATIVE
FECAL OCCULT BLD: NEGATIVE
Fecal Occult Blood, POC: NEGATIVE

## 2017-05-16 ENCOUNTER — Other Ambulatory Visit: Payer: Self-pay | Admitting: Internal Medicine

## 2017-07-01 LAB — HM DIABETES EYE EXAM

## 2017-07-05 ENCOUNTER — Encounter: Payer: Self-pay | Admitting: *Deleted

## 2017-09-06 NOTE — Progress Notes (Signed)
MEDICARE ANNUAL WELLNESS VISIT AND FOLLOW UP  Assessment:    Annual Medicare annual wellness visit  Hypothyroidism, unspecified hypothyroidism type -cont levothyroxine - TSH   Labile hypertension -cont monitoring -currently managed by diet and exericse - TSH   Hyperlipidemia -diet and exercise as tolerated -will check Lipid panel next visit  Medication management - CBC with Differential/Platelet - BASIC METABOLIC PANEL WITH GFR - Hepatic function panel  Chronic obstructive pulmonary disease, unspecified COPD type ([facility]) -cont to monitor -no current treatment needed  Osteoporosis -off fosamax as of early 2019 -Bone Density scan in 2 years   Elevated hemoglobin A1c -check a1c next visit -diet and exercise as tolerated   Vitamin D deficiency At goal at recent check; continue to recommend supplementation for goal of 70-100 Defer vitamin D level   BMI less than 19,adult -exercise as tolerated -cont increased calorie load   Over 30 minutes of exam, counseling, chart review, and critical decision making was performed Future Appointments  Date Time Provider [place]  03/29/2018 10:00 AM Unk Pinto, MD [facility] None     Plan:   During the course of the visit the patient was educated and counseled about appropriate screening and preventive services including:    Pneumococcal vaccine   Influenza vaccine  Td vaccine  Prevnar 13  Screening electrocardiogram  Screening mammography  Bone densitometry screening  Colorectal cancer screening  Diabetes screening  Glaucoma screening  Nutrition counseling   Advanced directives: given info/requested copies   Subjective:   Katie Morton is a 72 y.o. female who presents for Medicare Annual Wellness Visit and 3 month follow up on hypertension, history of prediabetes, hyperlipidemia, vitamin D def.   BMI is Body mass index is 18.21 kg/m., she has been working on diet and exercise. Wt  Readings from Last 3 Encounters:  09/07/17 118 lb (53.5 kg)  03/02/17 118 lb 9.6 oz (53.8 kg)  09/10/16 115 lb 6.4 oz (52.3 kg)   Her blood pressure has been controlled at home, today their BP is BP: 124/68 She does workout. She denies chest pain, shortness of breath, dizziness.    She is not on cholesterol medication (on omega 3 supplement only). Her cholesterol is not at goal. The cholesterol last visit was:   Lab Results  Component Value Date   CHOL 220 (H) 03/02/2017   HDL 83 03/02/2017   LDLCALC 113 (H) 03/02/2017   TRIG 126 03/02/2017   CHOLHDL 2.7 03/02/2017   Patient does have a mild history of prediabetes controlled with diet and exercise.  She notes no changes in weight or in exercise. Lab Results  Component Value Date   HGBA1C 5.4 03/02/2017   She is on thyroid medication. Her medication was not changed last visit.   Lab Results  Component Value Date   TSH 4.71 (H) 03/02/2017   Last GFR Lab Results  Component Value Date   GFRNONAA 92 03/02/2017   Patient is on Vitamin D supplement. Lab Results  Component Value Date   VD25OH 100 03/02/2017       Medication Review Current Outpatient Medications on File Prior to Visit  Medication Sig Dispense Refill  . aspirin EC 81 MG tablet Take 81 mg by mouth daily.    Marland Kitchen BIOTIN PO Take 1 capsule by mouth daily.    . Calcium Citrate-Vitamin D (CALCIUM CITRATE + D PO) Take by mouth daily.    . Cholecalciferol (VITAMIN D PO) Take 2,000 tablets by mouth daily.     Marland Kitchen  CINNAMON PO Take 1,000 capsules by mouth 2 (two) times daily.     . dorzolamide (TRUSOPT) 2 % ophthalmic solution INSTILL 1 DROP INTO BOTH EYES TWICE A DAY  11  . fish oil-omega-3 fatty acids 1000 MG capsule Take 1 g by mouth daily.     . Flaxseed, Linseed, (FLAXSEED OIL) 1000 MG CAPS Take 1,000 capsules by mouth daily.    Marland Kitchen latanoprost (XALATAN) 0.005 % ophthalmic solution INSTILL 1 DROP INTO BOTH EYES EVERY DAY AT NIGHT  11  . levothyroxine (SYNTHROID,  LEVOTHROID) 50 MCG tablet TAKE 1 TABLET DAILY ON AN EMPTY STOMACH FOR 30 MIN 90 tablet 3  . Misc Natural Products (OSTEO BI-FLEX JOINT SHIELD PO) Take 1 tablet by mouth 2 (two) times daily.    . Multiple Vitamins-Minerals (MULTIVITAMIN WITH MINERALS) tablet Take 1 tablet by mouth daily.    . Multiple Vitamins-Minerals (PRESERVISION AREDS PO) Take 1 tablet by mouth daily.    Marland Kitchen OVER THE COUNTER MEDICATION 2 capsules daily. Tumeric w/ Black pepper extract    . alendronate (FOSAMAX) 70 MG tablet TAKE 1 TABLET BY MOUTH EVERY 7 DAYS ON AN EMPTY STOMACH (Patient not taking: Reported on 09/07/2017) 12 tablet 2   No current facility-administered medications on file prior to visit.     Current Problems (verified) Patient Active Problem List   Diagnosis Date Noted  . BMI less than 19,adult 01/23/2015  . Encounter for Medicare annual wellness exam 01/23/2015  . Hypothyroidism 06/11/2014  . Medication management 01/01/2013  . Vitamin D deficiency 01/01/2013  . Hyperlipidemia   . Blood pressure elevated without history of HTN   . Abnormal glucose   . Osteoporosis 09/20/2012  . COPD (chronic obstructive pulmonary disease) ([facility])     Screening Tests Immunization History  Administered Date(s) Administered  . Influenza, High Dose Seasonal PF 01/02/2013, 01/07/2014, 01/23/2015, 12/01/2015  . Influenza-Unspecified 12/16/2011, 12/23/2016  . Pneumococcal Conjugate-13 01/07/2014  . Pneumococcal Polysaccharide-23 08/07/2015  . Pneumococcal-Unspecified 02/23/1995  . Td 02/22/2005, 07/28/2016  . Zoster 12/16/2011    Preventative care: Last colonoscopy: 2011 Last mammogram: 08/2016 - cat C DEXA: 08/2016, started on fosamax x 2015, stopped early 2019  Prior vaccinations: TD or Tdap: 2018 Influenza: 2018 Pneumococcal: 2017 Prevnar13: 2015 Shingles/Zostavax: 2013  Names of Other Physician/Practitioners you currently use: 1. [facility]- here for primary care 2.  Dr. Leanord Asal, eye doctor, last visit 2019, cataract and glaucoma 3. Dr. Gilford Rile, dentist, last visit 2019, goes q108m  Patient Care Team: Unk Pinto, MD as PCP - General (Internal Medicine) Laurence Spates, MD as Consulting Physician (Gastroenterology) Constance Holster, DDS Jari Pigg, MD as Consulting Physician (Dermatology)  Allergies Allergies  Allergen Reactions  . Silver Sulfadiazine     SURGICAL HISTORY She  has a past surgical history that includes Tonsillectomy; Mandible surgery; and Hip pinning, cannulated (Left, 09/19/2012). FAMILY HISTORY Her family history includes CAD in her maternal grandfather and paternal grandfather; COPD in her father; Cancer in her maternal aunt and paternal aunt; Cancer - Other in her father; Diabetes in her mother; Heart disease in her brother and mother; Hypertension in her mother; Mitral valve prolapse in her mother; Stroke in her father. SOCIAL HISTORY She  reports that she quit smoking about 39 years ago. She has never used smokeless tobacco. She reports that she drinks about 8.4 oz of alcohol per week. She reports that she does not use drugs. ' MEDICARE WELLNESS OBJECTIVES: Physical activity: Current Exercise Habits: Home exercise routine, Type of  exercise: walking, Time (Minutes): 30, Frequency (Times/Week): 7, Weekly Exercise (Minutes/Week): 210, Intensity: Mild, Exercise limited by: None identified Cardiac risk factors: Cardiac Risk Factors include: advanced age (>24men, >12 women);dyslipidemia;smoking/ tobacco exposure Depression/mood screen:   Depression screen [facility] 2/9 09/07/2017  Decreased Interest 0  Down, Depressed, Hopeless 0  PHQ - 2 Score 0    ADLs:  In your present state of health, do you have any difficulty performing the following activities: 09/07/2017 03/02/2017  Hearing? N N  Vision? N N  Difficulty concentrating or making decisions? N N  Walking or climbing stairs? N N  Dressing or bathing? N N  Doing errands, shopping? N N   Some recent data might be hidden     Cognitive Testing  Alert? Yes  Normal Appearance?Yes  Oriented to person? Yes  Place? Yes   Time? Yes  Recall of three objects?  Yes  Can perform simple calculations? Yes  Displays appropriate judgment?Yes  Can read the correct time from a watch face?Yes  EOL planning: Does Patient Have a Medical Advance Directive?: No Would patient like information on creating a medical advance directive?: No - Patient declined   Objective:   Today's Vitals   09/07/17 1107  BP: 124/68  Pulse: 64  Temp: (!) 97.3 F (36.3 C)  SpO2: 99%  Weight: 118 lb (53.5 kg)  Height: 5' 7.5" (1.715 m)   Body mass index is 18.21 kg/m.  General appearance: alert, no distress, WD/WN,  female HEENT: normocephalic, sclerae anicteric, TMs pearly, nares patent, no discharge or erythema, pharynx normal Oral cavity: MMM, no lesions Neck: supple, no lymphadenopathy, no thyromegaly, no masses Heart: RRR, normal S1, S2, no murmurs Lungs: CTA bilaterally, no wheezes, rhonchi, or rales Abdomen: +bs, soft, non tender, non distended, no masses, no hepatomegaly, no splenomegaly Musculoskeletal: nontender, no swelling, no obvious deformity, right 1st MCP knot/nontender, decrease extension.  Extremities: no edema, no cyanosis, no clubbing Pulses: 2+ symmetric, upper and lower extremities, normal cap refill Neurological: alert, oriented x 3, CN2-12 intact, strength normal upper extremities and lower extremities, sensation normal throughout, DTRs 2+ throughout, no cerebellar signs, gait normal Psychiatric: normal affect, behavior normal, pleasant  Breast: defer Gyn: defer Rectal: defer   Medicare Attestation I have personally reviewed: The patient's medical and social history Their use of alcohol, tobacco or illicit drugs Their current medications and supplements The patient's functional ability including ADLs,fall risks, home safety risks, cognitive, and hearing and visual  impairment Diet and physical activities Evidence for depression or mood disorders  The patient's weight, height, BMI, and visual acuity have been recorded in the chart.  I have made referrals, counseling, and provided education to the patient based on review of the above and I have provided the patient with a written personalized care plan for preventive services.     Izora Ribas, NP   09/07/2017

## 2017-09-07 ENCOUNTER — Ambulatory Visit: Payer: Medicare Other | Admitting: Adult Health

## 2017-09-07 ENCOUNTER — Encounter: Payer: Self-pay | Admitting: Adult Health

## 2017-09-07 VITALS — BP 124/68 | HR 64 | Temp 97.3°F | Ht 67.5 in | Wt 118.0 lb

## 2017-09-07 DIAGNOSIS — Z79899 Other long term (current) drug therapy: Secondary | ICD-10-CM | POA: Diagnosis not present

## 2017-09-07 DIAGNOSIS — E039 Hypothyroidism, unspecified: Secondary | ICD-10-CM

## 2017-09-07 DIAGNOSIS — J449 Chronic obstructive pulmonary disease, unspecified: Secondary | ICD-10-CM

## 2017-09-07 DIAGNOSIS — R7309 Other abnormal glucose: Secondary | ICD-10-CM | POA: Diagnosis not present

## 2017-09-07 DIAGNOSIS — R6889 Other general symptoms and signs: Secondary | ICD-10-CM | POA: Diagnosis not present

## 2017-09-07 DIAGNOSIS — E782 Mixed hyperlipidemia: Secondary | ICD-10-CM

## 2017-09-07 DIAGNOSIS — Z0001 Encounter for general adult medical examination with abnormal findings: Secondary | ICD-10-CM

## 2017-09-07 DIAGNOSIS — R030 Elevated blood-pressure reading, without diagnosis of hypertension: Secondary | ICD-10-CM

## 2017-09-07 DIAGNOSIS — Z0000 Encounter for general adult medical examination without abnormal findings: Secondary | ICD-10-CM

## 2017-09-07 DIAGNOSIS — M810 Age-related osteoporosis without current pathological fracture: Secondary | ICD-10-CM

## 2017-09-07 DIAGNOSIS — Z681 Body mass index (BMI) 19 or less, adult: Secondary | ICD-10-CM

## 2017-09-07 DIAGNOSIS — E559 Vitamin D deficiency, unspecified: Secondary | ICD-10-CM | POA: Diagnosis not present

## 2017-09-07 NOTE — Patient Instructions (Signed)
Aim for 7+ servings of fruits and vegetables daily  80+ fluid ounces of water or unsweet tea for healthy kidneys  Limit to max 1 drink of alcohol per sitting, avoid smoking  Limit animal fats in diet for cholesterol and heart health - choose grass fed whenever available  Aim for low stress - take time to unwind and care for your mental health  Aim for 150 min of moderate intensity exercise weekly for heart health, and weights twice weekly for bone health  Aim for 7-9 hours of sleep daily      When it comes to diets, agreement about the perfect plan isn't easy to find, even among the experts. Experts at the [employer] developed an idea known as the Healthy Eating Plate. Just imagine a plate divided into logical, healthy portions.  The emphasis is on diet quality:  Load up on vegetables and fruits - one-half of your plate: Aim for color and variety, and remember that potatoes don't count.  Go for whole grains - one-quarter of your plate: Whole wheat, barley, wheat berries, quinoa, oats, brown rice, and foods made with them. If you want pasta, go with whole wheat pasta.  Protein power - one-quarter of your plate: Fish, chicken, beans, and nuts are all healthy, versatile protein sources. Limit red meat.  The diet, however, does go beyond the plate, offering a few other suggestions.  Use healthy plant oils, such as olive, canola, soy, corn, sunflower and peanut. Check the labels, and avoid partially hydrogenated oil, which have unhealthy trans fats.  If you're thirsty, drink water. Coffee and tea are good in moderation, but skip sugary drinks and limit milk and dairy products to one or two daily servings.  The type of carbohydrate in the diet is more important than the amount. Some sources of carbohydrates, such as vegetables, fruits, whole grains, and beans-are healthier than others.  Finally, stay active.

## 2017-09-08 LAB — CBC WITH DIFFERENTIAL/PLATELET
BASOS ABS: 94 {cells}/uL (ref 0–200)
Basophils Relative: 1.3 %
EOS ABS: 497 {cells}/uL (ref 15–500)
Eosinophils Relative: 6.9 %
HEMATOCRIT: 40.3 % (ref 35.0–45.0)
Hemoglobin: 13.3 g/dL (ref 11.7–15.5)
LYMPHS ABS: 1771 {cells}/uL (ref 850–3900)
MCH: 30 pg (ref 27.0–33.0)
MCHC: 33 g/dL (ref 32.0–36.0)
MCV: 91 fL (ref 80.0–100.0)
MPV: 11.7 fL (ref 7.5–12.5)
Monocytes Relative: 10.6 %
NEUTROS PCT: 56.6 %
Neutro Abs: 4075 cells/uL (ref 1500–7800)
Platelets: 256 10*3/uL (ref 140–400)
RBC: 4.43 10*6/uL (ref 3.80–5.10)
RDW: 12.7 % (ref 11.0–15.0)
Total Lymphocyte: 24.6 %
WBC mixed population: 763 cells/uL (ref 200–950)
WBC: 7.2 10*3/uL (ref 3.8–10.8)

## 2017-09-08 LAB — COMPLETE METABOLIC PANEL WITH GFR
AG RATIO: 1.3 (calc) (ref 1.0–2.5)
ALKALINE PHOSPHATASE (APISO): 69 U/L (ref 33–130)
ALT: 18 U/L (ref 6–29)
AST: 20 U/L (ref 10–35)
Albumin: 4 g/dL (ref 3.6–5.1)
BILIRUBIN TOTAL: 0.4 mg/dL (ref 0.2–1.2)
BUN: 13 mg/dL (ref 7–25)
CHLORIDE: 100 mmol/L (ref 98–110)
CO2: 27 mmol/L (ref 20–32)
CREATININE: 0.62 mg/dL (ref 0.60–0.93)
Calcium: 9.6 mg/dL (ref 8.6–10.4)
GFR, Est African American: 106 mL/min/{1.73_m2} (ref >=60)
GFR, Est Non African American: 91 mL/min/{1.73_m2} (ref >=60)
GLOBULIN: 3 g/dL (ref 1.9–3.7)
Glucose, Bld: 92 mg/dL (ref 65–99)
POTASSIUM: 4.6 mmol/L (ref 3.5–5.3)
SODIUM: 138 mmol/L (ref 135–146)
Total Protein: 7 g/dL (ref 6.1–8.1)

## 2017-09-08 LAB — TSH: TSH: 2.41 mIU/L (ref 0.40–4.50)

## 2017-09-27 ENCOUNTER — Other Ambulatory Visit: Payer: Self-pay | Admitting: Physician Assistant

## 2017-09-27 DIAGNOSIS — Z1231 Encounter for screening mammogram for malignant neoplasm of breast: Secondary | ICD-10-CM

## 2017-10-21 ENCOUNTER — Ambulatory Visit
Admission: RE | Admit: 2017-10-21 | Discharge: 2017-10-21 | Disposition: A | Discharge status: Home / Self Care | Payer: Medicare Other | Source: Ambulatory Visit | Attending: Physician Assistant | Admitting: Physician Assistant

## 2017-10-21 DIAGNOSIS — Z1231 Encounter for screening mammogram for malignant neoplasm of breast: Secondary | ICD-10-CM

## 2017-12-29 ENCOUNTER — Ambulatory Visit (INDEPENDENT_AMBULATORY_CARE_PROVIDER_SITE_OTHER): Payer: Medicare Other | Admitting: *Deleted

## 2017-12-29 VITALS — Temp 97.1°F

## 2017-12-29 DIAGNOSIS — Z23 Encounter for immunization: Secondary | ICD-10-CM

## 2017-12-29 NOTE — Patient Instructions (Signed)
Patient is here for a NV to receive her Fluzone HD injection. The injection was given in her left deltoid and she tolerated well.

## 2018-02-28 ENCOUNTER — Encounter: Payer: Self-pay | Admitting: Physician Assistant

## 2018-02-28 DIAGNOSIS — E46 Unspecified protein-calorie malnutrition: Secondary | ICD-10-CM | POA: Insufficient documentation

## 2018-03-29 ENCOUNTER — Ambulatory Visit: Payer: Medicare Other | Admitting: Internal Medicine

## 2018-03-29 ENCOUNTER — Encounter: Payer: Self-pay | Admitting: Internal Medicine

## 2018-03-29 VITALS — BP 116/72 | HR 68 | Temp 97.7°F | Ht 67.5 in | Wt 121.8 lb

## 2018-03-29 DIAGNOSIS — Z79899 Other long term (current) drug therapy: Secondary | ICD-10-CM

## 2018-03-29 DIAGNOSIS — Z0001 Encounter for general adult medical examination with abnormal findings: Secondary | ICD-10-CM

## 2018-03-29 DIAGNOSIS — Z87891 Personal history of nicotine dependence: Secondary | ICD-10-CM

## 2018-03-29 DIAGNOSIS — E039 Hypothyroidism, unspecified: Secondary | ICD-10-CM

## 2018-03-29 DIAGNOSIS — R7303 Prediabetes: Secondary | ICD-10-CM

## 2018-03-29 DIAGNOSIS — Z1211 Encounter for screening for malignant neoplasm of colon: Secondary | ICD-10-CM

## 2018-03-29 DIAGNOSIS — Z1212 Encounter for screening for malignant neoplasm of rectum: Secondary | ICD-10-CM

## 2018-03-29 DIAGNOSIS — Z8249 Family history of ischemic heart disease and other diseases of the circulatory system: Secondary | ICD-10-CM

## 2018-03-29 DIAGNOSIS — Z0000 Encounter for general adult medical examination without abnormal findings: Secondary | ICD-10-CM

## 2018-03-29 DIAGNOSIS — E559 Vitamin D deficiency, unspecified: Secondary | ICD-10-CM

## 2018-03-29 DIAGNOSIS — R7309 Other abnormal glucose: Secondary | ICD-10-CM

## 2018-03-29 DIAGNOSIS — Z136 Encounter for screening for cardiovascular disorders: Secondary | ICD-10-CM

## 2018-03-29 DIAGNOSIS — R0989 Other specified symptoms and signs involving the circulatory and respiratory systems: Secondary | ICD-10-CM | POA: Diagnosis not present

## 2018-03-29 DIAGNOSIS — E782 Mixed hyperlipidemia: Secondary | ICD-10-CM

## 2018-03-29 NOTE — Patient Instructions (Signed)

## 2018-03-29 NOTE — Progress Notes (Signed)
[facility] Unk Pinto, M.D.     Uvaldo Bristle. Silverio Lay, P.A.-C Liane Comber, [place] [address] [place], [place] [address] Telephone [phone] Telefax [phone] Annual Screening/Preventative Visit & Comprehensive Evaluation &  Examination     This very nice 73 y.o. MWF  presents for a Screening /Preventative Visit & comprehensive evaluation and management of multiple medical co-morbidities.  Patient has been followed for  Labile HTN, HLD, Prediabetes  and Vitamin D Deficiency.      Patient is followed expectantly for labile HTN circa 2012.  Patient's BP has been controlled at home and patient denies any cardiac symptoms as chest pain, palpitations, shortness of breath, dizziness or ankle swelling. Today's BP is at goal - 116/72.      Patient's hyperlipidemia is controlled with diet and Fish  Oil supplements. Patient denies myalgias or other medication SE's. Last lipids were not at goal: Lab Results  Component Value Date   CHOL 220 (H) 03/02/2017   HDL 83 03/02/2017   LDLCALC 113 (H) 03/02/2017   TRIG 126 03/02/2017   CHOLHDL 2.7 03/02/2017      Patient has been on Thyroid Replacement since 2015.      Patient has hx/o prediabetes (A1c 5.7% / 2012 and 6.0% / 2016) and patient denies reactive hypoglycemic symptoms, visual blurring, diabetic polys or paresthesias. Last A1c was Normal & at goal: Lab Results  Component Value Date   HGBA1C 5.4 03/02/2017      Finally, patient has history of Vitamin D Deficiency and last Vitamin D was at goal: Lab Results  Component Value Date   VD25OH 100 03/02/2017   Current Outpatient Medications on File Prior to Visit  Medication Sig  . aspirin EC 81 MG tablet Take 81 mg by mouth daily.  Marland Kitchen BIOTIN PO Take 1 capsule by mouth daily.  . Calcium Citrate-Vitamin D (CALCIUM CITRATE + D PO) Take by mouth daily.  . Cholecalciferol (VITAMIN D PO) Take 2,000 tablets by  mouth daily.   Marland Kitchen CINNAMON PO Take 1,000 capsules by mouth 2 (two) times daily.   . dorzolamide (TRUSOPT) 2 % ophthalmic solution INSTILL 1 DROP INTO BOTH EYES TWICE A DAY  . fish oil-omega-3 fatty acids 1000 MG capsule Take 1 g by mouth daily.   . Flaxseed, Linseed, (FLAXSEED OIL) 1000 MG CAPS Take 1,000 capsules by mouth daily.  Marland Kitchen latanoprost (XALATAN) 0.005 % ophthalmic solution INSTILL 1 DROP INTO BOTH EYES EVERY DAY AT NIGHT  . levothyroxine (SYNTHROID, LEVOTHROID) 50 MCG tablet TAKE 1 TABLET DAILY ON AN EMPTY STOMACH FOR 30 MIN  . Misc Natural Products (OSTEO BI-FLEX JOINT SHIELD PO) Take 1 tablet by mouth 2 (two) times daily.  . Multiple Vitamins-Minerals (MULTIVITAMIN WITH MINERALS) tablet Take 1 tablet by mouth daily.  . Multiple Vitamins-Minerals (PRESERVISION AREDS PO) Take 1 tablet by mouth daily.  Marland Kitchen OVER THE COUNTER MEDICATION 2 capsules daily. Tumeric w/ Black pepper extract   No current facility-administered medications on file prior to visit.    Allergies  Allergen Reactions  . Silver Sulfadiazine    Past Medical History:  Diagnosis Date  . Arthritis   . Cancer ([place]) 2016   squamous cell skin cancer   . COPD (chronic obstructive pulmonary disease) ([place])   . Elevated hemoglobin A1c   . Hyperlipidemia   . Labile hypertension   . Osteoporosis   . Villous adenoma 2008   Health Maintenance  Topic Date Due  . MAMMOGRAM  10/22/2019  . TETANUS/TDAP  07/29/2026  . COLONOSCOPY  10/22/2026  . INFLUENZA VACCINE  Completed  . DEXA SCAN  Completed  . Hepatitis C Screening  Completed  . PNA vac Low Risk Adult  Completed   Immunization History  Administered Date(s) Administered  . Influenza, High Dose Seasonal PF 01/02/2013, 01/07/2014, 01/23/2015, 12/01/2015, 01/19/2017, 12/29/2017  . Influenza-Unspecified 12/16/2011, 12/23/2016, 01/23/2017  . Pneumococcal Conjugate-13 01/07/2014  . Pneumococcal Polysaccharide-23 08/07/2015  . Pneumococcal-Unspecified 02/23/1995  . Td  02/22/2005, 07/28/2016  . Zoster 12/16/2011   Last Colon - 09/24/2016 - Dr Sherrin Daisy - recommended f/u Colon in 3 years due Aug 2021  Last MGM -  10/21/2017  Past Surgical History:  Procedure Laterality Date  . HIP PINNING,CANNULATED Left 09/19/2012   Procedure: Percutaneous Screw Fixation Left Hip Fracture;  Surgeon: Nita Sells, MD;  Location: [facility];  Service: Orthopedics;  Laterality: Left;  Marland Kitchen MANDIBLE SURGERY    . TONSILLECTOMY     Family History  Problem Relation Age of Onset  . Hypertension Mother   . Diabetes Mother   . Mitral valve prolapse Mother   . Heart disease Mother   . Cancer - Other Father        Leukemia and Melanoma  . COPD Father   . Stroke Father   . CAD Paternal Grandfather   . CAD Maternal Grandfather   . Heart disease Brother   . Cancer Maternal Aunt        colon  . Cancer Paternal Aunt        colon   Social History   Tobacco Use  . Smoking status: Former Smoker    Last attempt to quit: 02/22/1978    Years since quitting: 40.1  . Smokeless tobacco: Never Used  Substance Use Topics  . Alcohol use: Yes    Alcohol/week: 14.0 standard drinks    Types: 14 Standard drinks or equivalent per week  . Drug use: No    ROS Constitutional: Denies fever, chills, weight loss/gain, headaches, insomnia,  night sweats, and change in appetite. Does c/o fatigue. Eyes: Denies redness, blurred vision, diplopia, discharge, itchy, watery eyes.  ENT: Denies discharge, congestion, post nasal drip, epistaxis, sore throat, earache, hearing loss, dental pain, Tinnitus, Vertigo, Sinus pain, snoring.  Cardio: Denies chest pain, palpitations, irregular heartbeat, syncope, dyspnea, diaphoresis, orthopnea, PND, claudication, edema Respiratory: denies cough, dyspnea, DOE, pleurisy, hoarseness, laryngitis, wheezing.  Gastrointestinal: Denies dysphagia, heartburn, reflux, water brash, pain, cramps, nausea, vomiting, bloating, diarrhea, constipation, hematemesis,  melena, hematochezia, jaundice, hemorrhoids Genitourinary: Denies dysuria, frequency, urgency, nocturia, hesitancy, discharge, hematuria, flank pain Breast: Breast lumps, nipple discharge, bleeding.  Musculoskeletal: Denies arthralgia, myalgia, stiffness, Jt. Swelling, pain, limp, and strain/sprain. Denies falls. Skin: Denies puritis, rash, hives, warts, acne, eczema, changing in skin lesion Neuro: No weakness, tremor, incoordination, spasms, paresthesia, pain Psychiatric: Denies confusion, memory loss, sensory loss. Denies Depression. Endocrine: Denies change in weight, skin, hair change, nocturia, and paresthesia, diabetic polys, visual blurring, hyper / hypo glycemic episodes.  Heme/Lymph: No excessive bleeding, bruising, enlarged lymph nodes.  Physical Exam  BP 116/72   Pulse 68   Temp 97.7 F (36.5 C)   Ht 5' 7.5" (1.715 m)   Wt 121 lb 12.8 oz (55.2 kg)   SpO2 96%   BMI 18.80 kg/m   General Appearance: Well nourished, well groomed and in no apparent distress.  Eyes: PERRLA, EOMs, conjunctiva no swelling or erythema, normal fundi and vessels. Sinuses: No frontal/maxillary tenderness ENT/Mouth: EACs patent /  TMs  nl. Nares clear without erythema, swelling, mucoid exudates. Oral hygiene is good. No erythema, swelling, or exudate. Tongue normal, non-obstructing. Tonsils not swollen or erythematous. Hearing normal.  Neck: Supple, thyroid not palpable. No bruits, nodes or JVD. Respiratory: Respiratory effort normal.  BS equal and clear bilateral without rales, rhonci, wheezing or stridor. Cardio: Heart sounds are normal with regular rate and rhythm and no murmurs, rubs or gallops. Peripheral pulses are normal and equal bilaterally without edema. No aortic or femoral bruits. Chest: symmetric with normal excursions and percussion. Breasts: Deferred to Gyn Office Abdomen: Flat, soft with bowel sounds active. Nontender, no guarding, rebound, hernias, masses, or organomegaly.  Lymphatics:  Non tender without lymphadenopathy.  Musculoskeletal: Full ROM all peripheral extremities, joint stability, 5/5 strength, and normal gait. Skin: Warm and dry without rashes, lesions, cyanosis, clubbing or  ecchymosis.  Neuro: Cranial nerves intact, reflexes equal bilaterally. Normal muscle tone, no cerebellar symptoms. Sensation intact.  Pysch: Alert and oriented X 3, normal affect, Insight and Judgment appropriate.   Assessment and Plan  1. Annual Preventative Screening Examination  2. Labile hypertension  - EKG 12-Lead - Urinalysis, Routine w reflex microscopic - Microalbumin / creatinine urine ratio - CBC with Differential/Platelet - COMPLETE METABOLIC PANEL WITH GFR - Magnesium - TSH  3. Hyperlipidemia, mixed  - EKG 12-Lead - Lipid panel - TSH  4. Abnormal glucose  - EKG 12-Lead - Hemoglobin A1c - Insulin, random  5. Vitamin D deficiency  - VITAMIN D 25 Hydroxyl  6. Hypothyroidism  - TSH  7. Prediabetes  - Hemoglobin A1c - Insulin, random  8. Screening for colorectal cancer  - POC Hemoccult Bld/Stl  9. Screening for ischemic heart disease  - EKG 12-Lead  10. FHx: heart disease  - EKG 12-Lead  11. Former smoker  - EKG 12-Lead  12. Medication management  - Urinalysis, Routine w reflex microscopic - Microalbumin / creatinine urine ratio - CBC with Differential/Platelet - COMPLETE METABOLIC PANEL WITH GFR - Magnesium - Lipid panel - TSH - Hemoglobin A1c - Insulin, random - VITAMIN D 25 Hydroxy       Patient was counseled in prudent diet to achieve/maintain BMI less than 25 for weight control, BP monitoring, regular exercise and medications. Discussed med's effects and SE's. Screening labs and tests as requested with regular follow-up as recommended. Over 40 minutes of exam, counseling, chart review and high complex critical decision making was performed.

## 2018-03-30 ENCOUNTER — Other Ambulatory Visit: Payer: Self-pay | Admitting: Internal Medicine

## 2018-03-30 DIAGNOSIS — E782 Mixed hyperlipidemia: Secondary | ICD-10-CM

## 2018-03-30 LAB — CBC WITH DIFFERENTIAL/PLATELET
ABSOLUTE MONOCYTES: 681 {cells}/uL (ref 200–950)
BASOS ABS: 83 {cells}/uL (ref 0–200)
Basophils Relative: 0.9 %
Eosinophils Absolute: 432 cells/uL (ref 15–500)
Eosinophils Relative: 4.7 %
HEMATOCRIT: 39.9 % (ref 35.0–45.0)
Hemoglobin: 13.2 g/dL (ref 11.7–15.5)
LYMPHS ABS: 1720 {cells}/uL (ref 850–3900)
MCH: 30.1 pg (ref 27.0–33.0)
MCHC: 33.1 g/dL (ref 32.0–36.0)
MCV: 91.1 fL (ref 80.0–100.0)
MPV: 11.6 fL (ref 7.5–12.5)
Monocytes Relative: 7.4 %
NEUTROS PCT: 68.3 %
Neutro Abs: 6284 cells/uL (ref 1500–7800)
Platelets: 253 10*3/uL (ref 140–400)
RBC: 4.38 10*6/uL (ref 3.80–5.10)
RDW: 12.1 % (ref 11.0–15.0)
Total Lymphocyte: 18.7 %
WBC: 9.2 10*3/uL (ref 3.8–10.8)

## 2018-03-30 LAB — COMPLETE METABOLIC PANEL WITH GFR
AG Ratio: 1.5 (calc) (ref 1.0–2.5)
ALT: 20 U/L (ref 6–29)
AST: 23 U/L (ref 10–35)
Albumin: 4.5 g/dL (ref 3.6–5.1)
Alkaline phosphatase (APISO): 70 U/L (ref 37–153)
BUN: 12 mg/dL (ref 7–25)
CO2: 27 mmol/L (ref 20–32)
Calcium: 9.6 mg/dL (ref 8.6–10.4)
Chloride: 101 mmol/L (ref 98–110)
Creat: 0.6 mg/dL (ref 0.60–0.93)
GFR, Est African American: 107 mL/min/{1.73_m2} (ref >=60)
GFR, Est Non African American: 92 mL/min/{1.73_m2} (ref >=60)
GLUCOSE: 115 mg/dL — AB (ref 65–99)
Globulin: 3 g/dL (calc) (ref 1.9–3.7)
Potassium: 3.9 mmol/L (ref 3.5–5.3)
Sodium: 137 mmol/L (ref 135–146)
Total Bilirubin: 0.5 mg/dL (ref 0.2–1.2)
Total Protein: 7.5 g/dL (ref 6.1–8.1)

## 2018-03-30 LAB — URINALYSIS, ROUTINE W REFLEX MICROSCOPIC
BILIRUBIN URINE: NEGATIVE
GLUCOSE, UA: NEGATIVE
HGB URINE DIPSTICK: NEGATIVE
Ketones, ur: NEGATIVE
Leukocytes, UA: NEGATIVE
Nitrite: NEGATIVE
PROTEIN: NEGATIVE
Specific Gravity, Urine: 1.003 (ref 1.001–1.03)
pH: 7.5 (ref 5.0–8.0)

## 2018-03-30 LAB — LIPID PANEL
Cholesterol: 229 mg/dL — ABNORMAL HIGH (ref <200)
HDL: 79 mg/dL (ref >=50)
LDL Cholesterol (Calc): 130 mg/dL (calc) — ABNORMAL HIGH
Non-HDL Cholesterol (Calc): 150 mg/dL (calc) — ABNORMAL HIGH (ref <130)
Total CHOL/HDL Ratio: 2.9 (calc) (ref <5.0)
Triglycerides: 92 mg/dL (ref <150)

## 2018-03-30 LAB — INSULIN, RANDOM: Insulin: 45.1 u[IU]/mL — ABNORMAL HIGH (ref 2.0–19.6)

## 2018-03-30 LAB — HEMOGLOBIN A1C
Hgb A1c MFr Bld: 5.6 % of total Hgb (ref <5.7)
Mean Plasma Glucose: 114 (calc)
eAG (mmol/L): 6.3 (calc)

## 2018-03-30 LAB — TSH: TSH: 3.78 mIU/L (ref 0.40–4.50)

## 2018-03-30 LAB — MICROALBUMIN / CREATININE URINE RATIO
Creatinine, Urine: 7 mg/dL — ABNORMAL LOW (ref 20–275)
Microalb, Ur: <0.2 mg/dL

## 2018-03-30 LAB — VITAMIN D 25 HYDROXY (VIT D DEFICIENCY, FRACTURES): Vit D, 25-Hydroxy: 83 ng/mL (ref 30–100)

## 2018-03-30 LAB — MAGNESIUM: Magnesium: 2.1 mg/dL (ref 1.5–2.5)

## 2018-03-30 MED ORDER — ROSUVASTATIN CALCIUM 20 MG PO TABS: ORAL_TABLET | ORAL | 1 refills | Status: DC

## 2018-05-09 ENCOUNTER — Telehealth: Payer: Self-pay | Admitting: *Deleted

## 2018-05-09 NOTE — Telephone Encounter (Signed)
Patient called and reported she has completed her hemoccult card, but realized she did not follow the diet.  She asked if she should do another card.  Per Dr Melford Aase, return the card and can repeat if it tests positive for blood.

## 2018-05-22 ENCOUNTER — Other Ambulatory Visit: Payer: Self-pay | Admitting: *Deleted

## 2018-05-22 DIAGNOSIS — Z1211 Encounter for screening for malignant neoplasm of colon: Secondary | ICD-10-CM

## 2018-05-22 DIAGNOSIS — Z1212 Encounter for screening for malignant neoplasm of rectum: Principal | ICD-10-CM

## 2018-05-22 LAB — POC HEMOCCULT BLD/STL (HOME/3-CARD/SCREEN)
Card #2 Fecal Occult Blod, POC: NEGATIVE
Card #3 Fecal Occult Blood, POC: NEGATIVE
FECAL OCCULT BLD: NEGATIVE

## 2018-05-31 ENCOUNTER — Other Ambulatory Visit: Payer: Self-pay | Admitting: *Deleted

## 2018-05-31 MED ORDER — LEVOTHYROXINE SODIUM 50 MCG PO TABS: ORAL_TABLET | ORAL | 3 refills | Status: DC

## 2018-07-10 ENCOUNTER — Other Ambulatory Visit: Payer: Self-pay | Admitting: Internal Medicine

## 2018-07-10 DIAGNOSIS — W57XXXA Bitten or stung by nonvenomous insect and other nonvenomous arthropods, initial encounter: Secondary | ICD-10-CM

## 2018-07-11 ENCOUNTER — Other Ambulatory Visit: Payer: Self-pay

## 2018-07-11 ENCOUNTER — Other Ambulatory Visit: Payer: Self-pay | Admitting: Internal Medicine

## 2018-07-11 ENCOUNTER — Ambulatory Visit: Payer: Medicare Other

## 2018-07-11 DIAGNOSIS — S50861A Insect bite (nonvenomous) of right forearm, initial encounter: Secondary | ICD-10-CM

## 2018-07-11 DIAGNOSIS — L538 Other specified erythematous conditions: Secondary | ICD-10-CM | POA: Diagnosis not present

## 2018-07-11 DIAGNOSIS — W57XXXA Bitten or stung by nonvenomous insect and other nonvenomous arthropods, initial encounter: Secondary | ICD-10-CM | POA: Diagnosis not present

## 2018-07-11 NOTE — Progress Notes (Signed)
Patient presents to the office for a nurse visit to have labs drawn to check for lyme disease. Patient states that she feels fine, no signs or symptoms. Vitals taken and recorded.

## 2018-07-12 ENCOUNTER — Other Ambulatory Visit: Payer: Self-pay | Admitting: Internal Medicine

## 2018-07-12 LAB — B. BURGDORFI ANTIBODIES: B burgdorferi Ab IgG+IgM: <0.9 index

## 2018-07-12 MED ORDER — TRIAMCINOLONE ACETONIDE 0.5 % EX OINT: TOPICAL_OINTMENT | CUTANEOUS | 1 refills | Status: DC

## 2018-09-26 NOTE — Progress Notes (Deleted)
MEDICARE ANNUAL WELLNESS VISIT AND FOLLOW UP  Assessment:    Annual Medicare annual wellness visit  Hypothyroidism, unspecified hypothyroidism type -cont levothyroxine - TSH   Labile hypertension -cont monitoring -currently managed by diet and exericse - TSH   Hyperlipidemia -diet and exercise as tolerated -will check Lipid panel next visit  Medication management - CBC with Differential/Platelet - CMP/GFR  Chronic obstructive pulmonary disease, unspecified COPD type ([place]) -cont to monitor -no current treatment needed  Osteoporosis -off fosamax as of early 2019 -Bone Density scan due after 08/2018 - ordered   Elevated hemoglobin A1c -check a1c annually -diet and exercise as tolerated   Vitamin D deficiency At goal at recent check; continue to recommend supplementation for goal of 70-100 Defer vitamin D level   BMI less than 19,adult -exercise as tolerated -cont increased calorie load  Right hand pain/stiffness ? Arthritis vs tendon contracture; progressive with grip weakness in dominant hand Will proceed with referral to hand specialist for evaluation and treatment   Over 30 minutes of exam, counseling, chart review, and critical decision making was performed Future Appointments  Date Time Provider [place]  04/04/2019  2:00 PM Unk Pinto, MD [facility] None     Plan:   During the course of the visit the patient was educated and counseled about appropriate screening and preventive services including:    Pneumococcal vaccine   Influenza vaccine  Td vaccine  Prevnar 13  Screening electrocardiogram  Screening mammography  Bone densitometry screening  Colorectal cancer screening  Diabetes screening  Glaucoma screening  Nutrition counseling   Advanced directives: given info/requested copies   Subjective:   Katie Morton is a 73 y.o. female who presents for Medicare Annual Wellness Visit and 3 month follow up on  hypertension, history of prediabetes, hyperlipidemia, vitamin D def.   R handed, has R thumb base discomfort and reduced ROM, progressive over the last 10 years, recently worse with weak grip.   BMI is Body mass index is 18.33 kg/m., she has been working on diet and exercise. Wt Readings from Last 3 Encounters:  09/27/18 118 lb 12.8 oz (53.9 kg)  07/11/18 117 lb (53.1 kg)  03/29/18 121 lb 12.8 oz (55.2 kg)   Her blood pressure has been controlled at home, today their BP is BP: 124/62 She does workout. She denies chest pain, shortness of breath, dizziness.    She is on cholesterol medication (newly prescribed rosuvastatin but never started, omega 3 supplement). Her cholesterol is not at goal. The cholesterol last visit was:   Lab Results  Component Value Date   CHOL 229 (H) 03/29/2018   HDL 79 03/29/2018   LDLCALC 130 (H) 03/29/2018   TRIG 92 03/29/2018   CHOLHDL 2.9 03/29/2018   Patient does have a mild history of prediabetes controlled with diet and exercise.  She notes no changes in weight or in exercise. Lab Results  Component Value Date   HGBA1C 5.6 03/29/2018   She is on thyroid medication. Her medication was not changed last visit.  Taking 25 mcg daily without recent dose changes.  Lab Results  Component Value Date   TSH 3.78 03/29/2018   Last GFR Lab Results  Component Value Date   GFRNONAA 92 03/29/2018   Patient is on Vitamin D supplement. Lab Results  Component Value Date   VD25OH 83 03/29/2018       Medication Review Current Outpatient Medications on File Prior to Visit  Medication Sig Dispense Refill  . aspirin EC 81  MG tablet Take 81 mg by mouth daily.    Marland Kitchen BIOTIN PO Take 1 capsule by mouth daily.    . Calcium Citrate-Vitamin D (CALCIUM CITRATE + D PO) Take by mouth daily.    . Cholecalciferol (VITAMIN D PO) Take 2,000 tablets by mouth daily.     Marland Kitchen CINNAMON PO Take 1,000 capsules by mouth 2 (two) times daily.     . dorzolamide (TRUSOPT) 2 % ophthalmic  solution INSTILL 1 DROP INTO BOTH EYES TWICE A DAY  11  . fish oil-omega-3 fatty acids 1000 MG capsule Take 1 g by mouth daily.     . Flaxseed, Linseed, (FLAXSEED OIL) 1000 MG CAPS Take 1,000 capsules by mouth daily.    Marland Kitchen latanoprost (XALATAN) 0.005 % ophthalmic solution INSTILL 1 DROP INTO BOTH EYES EVERY DAY AT NIGHT  11  . levothyroxine (SYNTHROID, LEVOTHROID) 50 MCG tablet TAKE 1 TABLET DAILY ON AN EMPTY STOMACH FOR 30 MIN (Patient taking differently: TAKE 1/2 TABLET DAILY ON AN EMPTY STOMACH FOR 30 MIN) 90 tablet 3  . Multiple Vitamins-Minerals (MULTIVITAMIN WITH MINERALS) tablet Take 1 tablet by mouth daily.    . Multiple Vitamins-Minerals (PRESERVISION AREDS PO) Take 1 tablet by mouth daily.    Marland Kitchen OVER THE COUNTER MEDICATION 2 capsules daily. Tumeric w/ Black pepper extract    . Misc Natural Products (OSTEO BI-FLEX JOINT SHIELD PO) Take 1 tablet by mouth 2 (two) times daily.    Marland Kitchen triamcinolone ointment (KENALOG) 0.5 % Apply to skin lesion 2 x /day and cover with a Bandaid (Patient not taking: Reported on 09/27/2018) 30 g 1   No current facility-administered medications on file prior to visit.     Current Problems (verified) Patient Active Problem List   Diagnosis Date Noted  . Protein-calorie malnutrition ([place]) 02/28/2018  . BMI less than 19,adult 01/23/2015  . Encounter for Medicare annual wellness exam 01/23/2015  . Hypothyroidism 06/11/2014  . Medication management 01/01/2013  . Vitamin D deficiency 01/01/2013  . Hyperlipidemia   . Blood pressure elevated without history of HTN   . Abnormal glucose   . Osteoporosis 09/20/2012  . COPD (chronic obstructive pulmonary disease) ([facility])     Screening Tests Immunization History  Administered Date(s) Administered  . Influenza, High Dose Seasonal PF 01/02/2013, 01/07/2014, 01/23/2015, 12/01/2015, 01/19/2017, 12/29/2017  . Influenza-Unspecified 12/16/2011, 12/23/2016, 01/23/2017  . Pneumococcal Conjugate-13 01/07/2014  . Pneumococcal  Polysaccharide-23 08/07/2015  . Pneumococcal-Unspecified 02/23/1995  . Td 02/22/2005, 07/28/2016  . Zoster 12/16/2011    Preventative care: Last colonoscopy: 2018 due 2021 Last mammogram: 09/2017, patient will schedule DEXA: 08/2016, started on fosamax x 2015, stopped early 2019   Prior vaccinations: TD or Tdap: 2018 Influenza: 2019 Pneumococcal: 2017 Prevnar13: 2015 Shingles/Zostavax: 2013  Names of Other Physician/Practitioners you currently use: 1. [facility]- here for primary care 2. Dr. Leanord Asal, eye doctor, last visit 2020, cataract and glaucoma 3. Dr. Gilford Rile, dentist, last visit 2020, goes q93m  Patient Care Team: Unk Pinto, MD as PCP - General (Internal Medicine) Laurence Spates, MD as Consulting Physician (Gastroenterology) Constance Holster, DDS Jari Pigg, MD as Consulting Physician (Dermatology)  Allergies Allergies  Allergen Reactions  . Silver Sulfadiazine     SURGICAL HISTORY She  has a past surgical history that includes Tonsillectomy; Mandible surgery; and Hip pinning, cannulated (Left, 09/19/2012). FAMILY HISTORY Her family history includes CAD in her maternal grandfather and paternal grandfather; COPD in her father; Cancer in her maternal aunt and paternal aunt; Cancer - Other  in her father; Diabetes in her mother; Heart disease in her brother and mother; Hypertension in her mother; Mitral valve prolapse in her mother; Stroke in her father. SOCIAL HISTORY She  reports that she quit smoking about 40 years ago. She has never used smokeless tobacco. She reports current alcohol use of about 14.0 standard drinks of alcohol per week. She reports that she does not use drugs. ' MEDICARE WELLNESS OBJECTIVES: Physical activity: Current Exercise Habits: Home exercise routine, Type of exercise: walking, Time (Minutes): 45, Frequency (Times/Week): 7, Weekly Exercise (Minutes/Week): 315, Intensity: Mild, Exercise limited by: None  identified Cardiac risk factors: Cardiac Risk Factors include: advanced age (>38men, >76 women);dyslipidemia Depression/mood screen:   Depression screen [facility] 2/9 09/27/2018  Decreased Interest 0  Down, Depressed, Hopeless 0  PHQ - 2 Score 0    ADLs:  In your present state of health, do you have any difficulty performing the following activities: 09/27/2018 03/29/2018  Hearing? N N  Vision? N N  Difficulty concentrating or making decisions? N N  Walking or climbing stairs? N N  Dressing or bathing? N N  Doing errands, shopping? N N  Some recent data might be hidden     Cognitive Testing  Alert? Yes  Normal Appearance?Yes  Oriented to person? Yes  Place? Yes   Time? Yes  Recall of three objects?  Yes  Can perform simple calculations? Yes  Displays appropriate judgment?Yes  Can read the correct time from a watch face?Yes  EOL planning: Does Patient Have a Medical Advance Directive?: No Would patient like information on creating a medical advance directive?: Yes (MAU/Ambulatory/Procedural Areas - Information given)   Objective:   Today's Vitals   09/27/18 0953  BP: 124/62  Pulse: (!) 59  Temp: (!) 97.3 F (36.3 C)  SpO2: 99%  Weight: 118 lb 12.8 oz (53.9 kg)   Body mass index is 18.33 kg/m.  General appearance: alert, no distress, WD/WN,  female HEENT: normocephalic, sclerae anicteric, TMs pearly, nares patent, no discharge or erythema, pharynx normal Oral cavity: MMM, no lesions Neck: supple, no lymphadenopathy, no thyromegaly, no masses Heart: RRR, normal S1, S2, no murmurs Lungs: CTA bilaterally, no wheezes, rhonchi, or rales Abdomen: +bs, soft, non tender, non distended, no masses, no hepatomegaly, no splenomegaly Musculoskeletal: nontender, no swelling, no obvious deformity, right 1st MCP knot/nontender, decrease extension.  Extremities: no edema, no cyanosis, no clubbing Pulses: 2+ symmetric, upper and lower extremities, normal cap refill Neurological: alert,  oriented x 3, CN2-12 intact, strength normal upper extremities and lower extremities, sensation normal throughout, DTRs 2+ throughout, no cerebellar signs, gait normal Psychiatric: normal affect, behavior normal, pleasant  Skin: warm/dry, soft smooth growth approx 1 cmx 2cm to forearm; fatty texture, non-erythematous Breast: defer Gyn: defer Rectal: defer   Medicare Attestation I have personally reviewed: The patient's medical and social history Their use of alcohol, tobacco or illicit drugs Their current medications and supplements The patient's functional ability including ADLs,fall risks, home safety risks, cognitive, and hearing and visual impairment Diet and physical activities Evidence for depression or mood disorders  The patient's weight, height, BMI, and visual acuity have been recorded in the chart.  I have made referrals, counseling, and provided education to the patient based on review of the above and I have provided the patient with a written personalized care plan for preventive services.     Izora Ribas, NP   09/27/2018

## 2018-09-26 NOTE — Chronic Care Management (AMB) (Deleted)
CCM telephone visit  Medications reviewed/reconciled: {YES/NO/WILD DQQIW:97989}  Current Outpatient Medications (Endocrine & Metabolic):  .  levothyroxine (SYNTHROID, LEVOTHROID) 50 MCG tablet, TAKE 1 TABLET DAILY ON AN EMPTY STOMACH FOR 30 MIN  Current Outpatient Medications (Cardiovascular):  .  rosuvastatin (CRESTOR) 20 MG tablet, Take 1 tablet daily for Cholesterol   Current Outpatient Medications (Analgesics):  .  aspirin EC 81 MG tablet, Take 81 mg by mouth daily.   Current Outpatient Medications (Other):  .  BIOTIN PO, Take 1 capsule by mouth daily. .  Calcium Citrate-Vitamin D (CALCIUM CITRATE + D PO), Take by mouth daily. .  Cholecalciferol (VITAMIN D PO), Take 2,000 tablets by mouth daily.  Marland Kitchen  CINNAMON PO, Take 1,000 capsules by mouth 2 (two) times daily.  .  dorzolamide (TRUSOPT) 2 % ophthalmic solution, INSTILL 1 DROP INTO BOTH EYES TWICE A DAY .  fish oil-omega-3 fatty acids 1000 MG capsule, Take 1 g by mouth daily.  .  Flaxseed, Linseed, (FLAXSEED OIL) 1000 MG CAPS, Take 1,000 capsules by mouth daily. Marland Kitchen  latanoprost (XALATAN) 0.005 % ophthalmic solution, INSTILL 1 DROP INTO BOTH EYES EVERY DAY AT NIGHT .  Misc Natural Products (OSTEO BI-FLEX JOINT SHIELD PO), Take 1 tablet by mouth 2 (two) times daily. .  Multiple Vitamins-Minerals (MULTIVITAMIN WITH MINERALS) tablet, Take 1 tablet by mouth daily. .  Multiple Vitamins-Minerals (PRESERVISION AREDS PO), Take 1 tablet by mouth daily. Marland Kitchen  OVER THE COUNTER MEDICATION, 2 capsules daily. Tumeric w/ Black pepper extract .  triamcinolone ointment (KENALOG) 0.5 %, Apply to skin lesion 2 x /day and cover with a Bandaid  Medication adherence: {YES/NO/WILD CARDS:18581} Any Side effects?{YES/NO/WILD QJJHE:17408} Refills needed:{YES/NO/WILD XKGYJ:85631}  Health Logs:  Goals Reviewed:  Goals   None     Any patient concerns?:  {CONCERNS; COMPLAINTS SHFW:26378}  Plan:    Future Appointments  Date Time Provider [place]  09/27/2018 10:00 AM Liane Comber, NP [facility] None  04/04/2019  2:00 PM Unk Pinto, MD [facility] None     Time Spent:  Izora Ribas, NP

## 2018-09-27 ENCOUNTER — Encounter: Payer: Self-pay | Admitting: Adult Health

## 2018-09-27 ENCOUNTER — Other Ambulatory Visit: Payer: Self-pay

## 2018-09-27 ENCOUNTER — Ambulatory Visit: Payer: Medicare Other | Admitting: Adult Health

## 2018-09-27 VITALS — BP 124/62 | HR 59 | Temp 97.3°F | Wt 118.8 lb

## 2018-09-27 DIAGNOSIS — Z0001 Encounter for general adult medical examination with abnormal findings: Secondary | ICD-10-CM

## 2018-09-27 DIAGNOSIS — E46 Unspecified protein-calorie malnutrition: Secondary | ICD-10-CM | POA: Diagnosis not present

## 2018-09-27 DIAGNOSIS — R7309 Other abnormal glucose: Secondary | ICD-10-CM

## 2018-09-27 DIAGNOSIS — Z79899 Other long term (current) drug therapy: Secondary | ICD-10-CM

## 2018-09-27 DIAGNOSIS — Z0000 Encounter for general adult medical examination without abnormal findings: Secondary | ICD-10-CM

## 2018-09-27 DIAGNOSIS — E039 Hypothyroidism, unspecified: Secondary | ICD-10-CM

## 2018-09-27 DIAGNOSIS — R6889 Other general symptoms and signs: Secondary | ICD-10-CM

## 2018-09-27 DIAGNOSIS — E782 Mixed hyperlipidemia: Secondary | ICD-10-CM

## 2018-09-27 DIAGNOSIS — E559 Vitamin D deficiency, unspecified: Secondary | ICD-10-CM

## 2018-09-27 DIAGNOSIS — J449 Chronic obstructive pulmonary disease, unspecified: Secondary | ICD-10-CM

## 2018-09-27 DIAGNOSIS — M810 Age-related osteoporosis without current pathological fracture: Secondary | ICD-10-CM | POA: Diagnosis not present

## 2018-09-27 DIAGNOSIS — Z681 Body mass index (BMI) 19 or less, adult: Secondary | ICD-10-CM

## 2018-09-27 DIAGNOSIS — M79641 Pain in right hand: Secondary | ICD-10-CM

## 2018-09-27 NOTE — Patient Instructions (Addendum)
Ms. Viernes , Thank you for taking time to come for your Medicare Wellness Visit. I appreciate your ongoing commitment to your health goals. Please review the following plan we discussed and let me know if I can assist you in the future.   These are the goals we discussed: Goals    . LDL CALC < 130       This is a list of the screening recommended for you and due dates:  Health Maintenance  Topic Date Due  . Flu Shot  09/23/2018  . Mammogram  10/22/2019  . Colon Cancer Screening  10/22/2019  . Tetanus Vaccine  07/29/2026  . DEXA scan (bone density measurement)  Completed  .  Hepatitis C: One time screening is recommended by Center for Disease Control  (CDC) for  adults born from [birth year] through 1963.   Completed  . Pneumonia vaccines  Completed      Lipoma  A lipoma is a noncancerous (benign) tumor that is made up of fat cells. This is a very common type of soft-tissue growth. Lipomas are usually found under the skin (subcutaneous). They may occur in any tissue of the body that contains fat. Common areas for lipomas to appear include the back, shoulders, buttocks, and thighs.  Lipomas grow slowly, and they are usually painless. Most lipomas do not cause problems and do not require treatment. What are the causes? The cause of this condition is not known. What increases the risk? You are more likely to develop this condition if:  You are 57-63 years old.  You have a family history of lipomas. What are the signs or symptoms? A lipoma usually appears as a small, round bump under the skin. In most cases, the lump will:  Feel soft or rubbery.  Not cause pain or other symptoms. However, if a lipoma is located in an area where it pushes on nerves, it can become painful or cause other symptoms. How is this diagnosed? A lipoma can usually be diagnosed with a physical exam. You may also have tests to confirm the diagnosis and to rule out other conditions. Tests may include:  Imaging  tests, such as a CT scan or MRI.  Removal of a tissue sample to be looked at under a microscope (biopsy). How is this treated? Treatment for this condition depends on the size of the lipoma and whether it is causing any symptoms.  For small lipomas that are not causing problems, no treatment is needed.  If a lipoma is bigger or it causes problems, surgery may be done to remove the lipoma. Lipomas can also be removed to improve appearance. Most often, the procedure is done after applying a medicine that numbs the area (local anesthetic). Follow these instructions at home:  Watch your lipoma for any changes.  Keep all follow-up visits as told by your health care provider. This is important. Contact a health care provider if:  Your lipoma becomes larger or hard.  Your lipoma becomes painful, red, or increasingly swollen. These could be signs of infection or a more serious condition. Get help right away if:  You develop tingling or numbness in an area near the lipoma. This could indicate that the lipoma is causing nerve damage. Summary  A lipoma is a noncancerous tumor that is made up of fat cells.  Most lipomas do not cause problems and do not require treatment.  If a lipoma is bigger or it causes problems, surgery may be done to remove the lipoma. This  information is not intended to replace advice given to you by your health care provider. Make sure you discuss any questions you have with your health care provider. Document Released: 01/29/2002 Document Revised: 01/25/2017 Document Reviewed: 01/25/2017 Elsevier Patient Education  [place].

## 2018-09-27 NOTE — Progress Notes (Deleted)
MEDICARE ANNUAL WELLNESS VISIT AND FOLLOW UP  Assessment:    Annual Medicare annual wellness visit  Hypothyroidism, unspecified hypothyroidism type -cont levothyroxine - TSH   Labile hypertension -cont monitoring -currently managed by diet and exericse - TSH   Hyperlipidemia -diet and exercise as tolerated -will check Lipid panel next visit  Medication management - CBC with Differential/Platelet - CMP/GFR  Chronic obstructive pulmonary disease, unspecified COPD type ([place]) -cont to monitor -no current treatment needed  Osteoporosis -off fosamax as of early 2019 -Bone Density scan due after 08/2018 - ordered   Elevated hemoglobin A1c -check a1c annually -diet and exercise as tolerated   Vitamin D deficiency At goal at recent check; continue to recommend supplementation for goal of 70-100 Defer vitamin D level   BMI less than 19,adult -exercise as tolerated -cont increased calorie load  Right hand pain/stiffness ? Arthritis vs tendon contracture; progressive with grip weakness in dominant hand Will proceed with referral to hand specialist for evaluation and treatment   Over 30 minutes of exam, counseling, chart review, and critical decision making was performed Future Appointments  Date Time Provider [place]  04/04/2019  2:00 PM Unk Pinto, MD [facility] None     Plan:   During the course of the visit the patient was educated and counseled about appropriate screening and preventive services including:    Pneumococcal vaccine   Influenza vaccine  Td vaccine  Prevnar 13  Screening electrocardiogram  Screening mammography  Bone densitometry screening  Colorectal cancer screening  Diabetes screening  Glaucoma screening  Nutrition counseling   Advanced directives: given info/requested copies   Subjective:   Katie Morton is a 73 y.o. female who presents for Medicare Annual Wellness Visit and 3 month follow up on  hypertension, history of prediabetes, hyperlipidemia, vitamin D def.   R handed, has R thumb base discomfort and reduced ROM, progressive over the last 10 years, recently worse with weak grip.   BMI is Body mass index is 18.33 kg/m., she has been working on diet and exercise. Wt Readings from Last 3 Encounters:  09/27/18 118 lb 12.8 oz (53.9 kg)  07/11/18 117 lb (53.1 kg)  03/29/18 121 lb 12.8 oz (55.2 kg)   Her blood pressure has been controlled at home, today their BP is BP: 124/62 She does workout. She denies chest pain, shortness of breath, dizziness.    She is on cholesterol medication (newly prescribed rosuvastatin but never started, omega 3 supplement). Her cholesterol is not at goal. The cholesterol last visit was:   Lab Results  Component Value Date   CHOL 229 (H) 03/29/2018   HDL 79 03/29/2018   LDLCALC 130 (H) 03/29/2018   TRIG 92 03/29/2018   CHOLHDL 2.9 03/29/2018   Patient does have a mild history of prediabetes controlled with diet and exercise.  She notes no changes in weight or in exercise. Lab Results  Component Value Date   HGBA1C 5.6 03/29/2018   She is on thyroid medication. Her medication was not changed last visit.  Taking 25 mcg daily without recent dose changes.  Lab Results  Component Value Date   TSH 3.78 03/29/2018   Last GFR Lab Results  Component Value Date   GFRNONAA 92 03/29/2018   Patient is on Vitamin D supplement. Lab Results  Component Value Date   VD25OH 83 03/29/2018       Medication Review Current Outpatient Medications on File Prior to Visit  Medication Sig Dispense Refill  . aspirin EC 81  MG tablet Take 81 mg by mouth daily.    Marland Kitchen BIOTIN PO Take 1 capsule by mouth daily.    . Calcium Citrate-Vitamin D (CALCIUM CITRATE + D PO) Take by mouth daily.    . Cholecalciferol (VITAMIN D PO) Take 2,000 tablets by mouth daily.     Marland Kitchen CINNAMON PO Take 1,000 capsules by mouth 2 (two) times daily.     . dorzolamide (TRUSOPT) 2 % ophthalmic  solution INSTILL 1 DROP INTO BOTH EYES TWICE A DAY  11  . fish oil-omega-3 fatty acids 1000 MG capsule Take 1 g by mouth daily.     . Flaxseed, Linseed, (FLAXSEED OIL) 1000 MG CAPS Take 1,000 capsules by mouth daily.    Marland Kitchen latanoprost (XALATAN) 0.005 % ophthalmic solution INSTILL 1 DROP INTO BOTH EYES EVERY DAY AT NIGHT  11  . levothyroxine (SYNTHROID, LEVOTHROID) 50 MCG tablet TAKE 1 TABLET DAILY ON AN EMPTY STOMACH FOR 30 MIN (Patient taking differently: TAKE 1/2 TABLET DAILY ON AN EMPTY STOMACH FOR 30 MIN) 90 tablet 3  . Multiple Vitamins-Minerals (MULTIVITAMIN WITH MINERALS) tablet Take 1 tablet by mouth daily.    . Multiple Vitamins-Minerals (PRESERVISION AREDS PO) Take 1 tablet by mouth daily.    Marland Kitchen OVER THE COUNTER MEDICATION 2 capsules daily. Tumeric w/ Black pepper extract    . Misc Natural Products (OSTEO BI-FLEX JOINT SHIELD PO) Take 1 tablet by mouth 2 (two) times daily.    Marland Kitchen triamcinolone ointment (KENALOG) 0.5 % Apply to skin lesion 2 x /day and cover with a Bandaid (Patient not taking: Reported on 09/27/2018) 30 g 1   No current facility-administered medications on file prior to visit.     Current Problems (verified) Patient Active Problem List   Diagnosis Date Noted  . Protein-calorie malnutrition ([place]) 02/28/2018  . BMI less than 19,adult 01/23/2015  . Encounter for Medicare annual wellness exam 01/23/2015  . Hypothyroidism 06/11/2014  . Medication management 01/01/2013  . Vitamin D deficiency 01/01/2013  . Hyperlipidemia   . Blood pressure elevated without history of HTN   . Abnormal glucose   . Osteoporosis 09/20/2012  . COPD (chronic obstructive pulmonary disease) ([facility])     Screening Tests Immunization History  Administered Date(s) Administered  . Influenza, High Dose Seasonal PF 01/02/2013, 01/07/2014, 01/23/2015, 12/01/2015, 01/19/2017, 12/29/2017  . Influenza-Unspecified 12/16/2011, 12/23/2016, 01/23/2017  . Pneumococcal Conjugate-13 01/07/2014  . Pneumococcal  Polysaccharide-23 08/07/2015  . Pneumococcal-Unspecified 02/23/1995  . Td 02/22/2005, 07/28/2016  . Zoster 12/16/2011    Preventative care: Last colonoscopy: 2018 due 2021 Last mammogram: 09/2017, patient will schedule DEXA: 08/2016, started on fosamax x 2015, stopped early 2019   Prior vaccinations: TD or Tdap: 2018 Influenza: 2019 Pneumococcal: 2017 Prevnar13: 2015 Shingles/Zostavax: 2013  Names of Other Physician/Practitioners you currently use: 1. [facility]- here for primary care 2. Dr. Leanord Asal, eye doctor, last visit 2020, cataract and glaucoma 3. Dr. Gilford Rile, dentist, last visit 2020, goes q24m  Patient Care Team: Unk Pinto, MD as PCP - General (Internal Medicine) Laurence Spates, MD as Consulting Physician (Gastroenterology) Constance Holster, DDS Jari Pigg, MD as Consulting Physician (Dermatology)  Allergies Allergies  Allergen Reactions  . Silver Sulfadiazine     SURGICAL HISTORY She  has a past surgical history that includes Tonsillectomy; Mandible surgery; and Hip pinning, cannulated (Left, 09/19/2012). FAMILY HISTORY Her family history includes CAD in her maternal grandfather and paternal grandfather; COPD in her father; Cancer in her maternal aunt and paternal aunt; Cancer - Other  in her father; Diabetes in her mother; Heart disease in her brother and mother; Hypertension in her mother; Mitral valve prolapse in her mother; Stroke in her father. SOCIAL HISTORY She  reports that she quit smoking about 40 years ago. She has never used smokeless tobacco. She reports current alcohol use of about 14.0 standard drinks of alcohol per week. She reports that she does not use drugs. ' MEDICARE WELLNESS OBJECTIVES: Physical activity: Current Exercise Habits: Home exercise routine, Type of exercise: walking, Time (Minutes): 45, Frequency (Times/Week): 7, Weekly Exercise (Minutes/Week): 315, Intensity: Mild, Exercise limited by: None  identified Cardiac risk factors: Cardiac Risk Factors include: advanced age (>54men, >79 women);dyslipidemia Depression/mood screen:   Depression screen [facility] 2/9 09/27/2018  Decreased Interest 0  Down, Depressed, Hopeless 0  PHQ - 2 Score 0    ADLs:  In your present state of health, do you have any difficulty performing the following activities: 09/27/2018 03/29/2018  Hearing? N N  Vision? N N  Difficulty concentrating or making decisions? N N  Walking or climbing stairs? N N  Dressing or bathing? N N  Doing errands, shopping? N N  Some recent data might be hidden     Cognitive Testing  Alert? Yes  Normal Appearance?Yes  Oriented to person? Yes  Place? Yes   Time? Yes  Recall of three objects?  Yes  Can perform simple calculations? Yes  Displays appropriate judgment?Yes  Can read the correct time from a watch face?Yes  EOL planning: Does Patient Have a Medical Advance Directive?: No Would patient like information on creating a medical advance directive?: Yes (MAU/Ambulatory/Procedural Areas - Information given)   Objective:   Today's Vitals   09/27/18 0953  BP: 124/62  Pulse: (!) 59  Temp: (!) 97.3 F (36.3 C)  SpO2: 99%  Weight: 118 lb 12.8 oz (53.9 kg)   Body mass index is 18.33 kg/m.  General appearance: alert, no distress, WD/WN,  female HEENT: normocephalic, sclerae anicteric, TMs pearly, nares patent, no discharge or erythema, pharynx normal Oral cavity: MMM, no lesions Neck: supple, no lymphadenopathy, no thyromegaly, no masses Heart: RRR, normal S1, S2, no murmurs Lungs: CTA bilaterally, no wheezes, rhonchi, or rales Abdomen: +bs, soft, non tender, non distended, no masses, no hepatomegaly, no splenomegaly Musculoskeletal: nontender, no swelling, no obvious deformity, right 1st MCP knot/nontender, decrease extension.  Extremities: no edema, no cyanosis, no clubbing Pulses: 2+ symmetric, upper and lower extremities, normal cap refill Neurological: alert,  oriented x 3, CN2-12 intact, strength normal upper extremities and lower extremities, sensation normal throughout, DTRs 2+ throughout, no cerebellar signs, gait normal Psychiatric: normal affect, behavior normal, pleasant  Skin: warm/dry, soft smooth growth approx 1 cmx 2cm to forearm; fatty texture, non-erythematous Breast: defer Gyn: defer Rectal: defer   Medicare Attestation I have personally reviewed: The patient's medical and social history Their use of alcohol, tobacco or illicit drugs Their current medications and supplements The patient's functional ability including ADLs,fall risks, home safety risks, cognitive, and hearing and visual impairment Diet and physical activities Evidence for depression or mood disorders  The patient's weight, height, BMI, and visual acuity have been recorded in the chart.  I have made referrals, counseling, and provided education to the patient based on review of the above and I have provided the patient with a written personalized care plan for preventive services.     Izora Ribas, NP   09/27/2018

## 2018-09-27 NOTE — Progress Notes (Signed)
MEDICARE ANNUAL WELLNESS VISIT AND FOLLOW UP  Assessment:    Annual Medicare annual wellness visit  Hypothyroidism, unspecified hypothyroidism type -cont levothyroxine - TSH   Labile hypertension -cont monitoring -currently managed by diet and exericse - TSH   Hyperlipidemia -diet and exercise as tolerated -will check Lipid panel next visit  Medication management - CBC with Differential/Platelet - BASIC METABOLIC PANEL WITH GFR - Hepatic function panel  Chronic obstructive pulmonary disease, unspecified COPD type ([place]) -cont to monitor -no current treatment needed  Osteoporosis -off fosamax as of early 2019 -Bone Density scan in 2 years   Elevated hemoglobin A1c -check a1c at CPE -diet and exercise as tolerated   Vitamin D deficiency At goal at recent check; continue to recommend supplementation for goal of 70-100 Defer vitamin D level   BMI less than 19,adult -exercise as tolerated -cont increased calorie load  R hand pain/stiffness R 1st digit joint base; arthritis vs contracture; discussed xray vs hand specialist referral Her preference to proceed with evaluation by specialist Suggested low dose OTC NSAID PRN pain  Over 40 minutes of exam, counseling, chart review and critical decision making was performed Future Appointments  Date Time Provider [place]  04/04/2019  2:00 PM Unk Pinto, MD [facility] None     Plan:   During the course of the visit the patient was educated and counseled about appropriate screening and preventive services including:    Pneumococcal vaccine   Prevnar 13  Influenza vaccine  Td vaccine  Screening electrocardiogram  Bone densitometry screening  Colorectal cancer screening  Diabetes screening  Glaucoma screening  Nutrition counseling   Advanced directives: requested   Subjective:  Katie Morton is a 73 y.o. female who presents for Medicare Annual Wellness Visit and 3 month follow up.    R handed female; c/o stiffness, reduced ROM and intermittent pain of R hand/base of thumb. She reports this is progressive over the past 10 years. Reports recently noting weaker grip which is somewhat limiting and would like to see a specialist.   BMI is Body mass index is 18.33 kg/m., she has been working on diet and exercise. Wt Readings from Last 3 Encounters:  09/27/18 118 lb 12.8 oz (53.9 kg)  07/11/18 117 lb (53.1 kg)  03/29/18 121 lb 12.8 oz (55.2 kg)    Her blood pressure has been controlled at home, today their BP is BP: 124/62 She does workout. She denies chest pain, shortness of breath, dizziness.   She is not on cholesterol medication (taking omega 3 supplement, was prescribed rosuvastatin last visit but never started, prefers to defer unless LDL trending up further) and denies myalgias. Her cholesterol is not at goal. The cholesterol last visit was:   Lab Results  Component Value Date   CHOL 229 (H) 03/29/2018   HDL 79 03/29/2018   LDLCALC 130 (H) 03/29/2018   TRIG 92 03/29/2018   CHOLHDL 2.9 03/29/2018    She has been working on diet and exercise for hx of prediabetes recently well controlled, and denies increased appetite, nausea, paresthesia of the feet, polydipsia, polyuria and visual disturbances. Last A1C in the office was:  Lab Results  Component Value Date   HGBA1C 5.6 03/29/2018   She is on thyroid medication. Her medication was not changed last visit.  Taking 25 mcg daily.  Lab Results  Component Value Date   TSH 3.78 03/29/2018   Last GFR: Lab Results  Component Value Date   GFRNONAA 92 03/29/2018   Patient  is on Vitamin D supplement.   Lab Results  Component Value Date   VD25OH 83 03/29/2018      Medication Review: Current Outpatient Medications on File Prior to Visit  Medication Sig Dispense Refill  . aspirin EC 81 MG tablet Take 81 mg by mouth daily.    Marland Kitchen BIOTIN PO Take 1 capsule by mouth daily.    . Calcium Citrate-Vitamin D (CALCIUM  CITRATE + D PO) Take by mouth daily.    . Cholecalciferol (VITAMIN D PO) Take 2,000 tablets by mouth daily.     Marland Kitchen CINNAMON PO Take 1,000 capsules by mouth 2 (two) times daily.     . dorzolamide (TRUSOPT) 2 % ophthalmic solution INSTILL 1 DROP INTO BOTH EYES TWICE A DAY  11  . fish oil-omega-3 fatty acids 1000 MG capsule Take 1 g by mouth daily.     . Flaxseed, Linseed, (FLAXSEED OIL) 1000 MG CAPS Take 1,000 capsules by mouth daily.    Marland Kitchen latanoprost (XALATAN) 0.005 % ophthalmic solution INSTILL 1 DROP INTO BOTH EYES EVERY DAY AT NIGHT  11  . levothyroxine (SYNTHROID, LEVOTHROID) 50 MCG tablet TAKE 1 TABLET DAILY ON AN EMPTY STOMACH FOR 30 MIN (Patient taking differently: TAKE 1/2 TABLET DAILY ON AN EMPTY STOMACH FOR 30 MIN) 90 tablet 3  . Multiple Vitamins-Minerals (MULTIVITAMIN WITH MINERALS) tablet Take 1 tablet by mouth daily.    . Multiple Vitamins-Minerals (PRESERVISION AREDS PO) Take 1 tablet by mouth daily.    Marland Kitchen OVER THE COUNTER MEDICATION 2 capsules daily. Tumeric w/ Black pepper extract    . Misc Natural Products (OSTEO BI-FLEX JOINT SHIELD PO) Take 1 tablet by mouth 2 (two) times daily.    Marland Kitchen triamcinolone ointment (KENALOG) 0.5 % Apply to skin lesion 2 x /day and cover with a Bandaid (Patient not taking: Reported on 09/27/2018) 30 g 1   No current facility-administered medications on file prior to visit.     Allergies  Allergen Reactions  . Silver Sulfadiazine     Current Problems (verified) Patient Active Problem List   Diagnosis Date Noted  . Protein-calorie malnutrition ([place]) 02/28/2018  . BMI less than 19,adult 01/23/2015  . Encounter for Medicare annual wellness exam 01/23/2015  . Hypothyroidism 06/11/2014  . Medication management 01/01/2013  . Vitamin D deficiency 01/01/2013  . Hyperlipidemia   . Blood pressure elevated without history of HTN   . Abnormal glucose   . Osteoporosis 09/20/2012  . COPD (chronic obstructive pulmonary disease) ([facility])     Screening  Tests Immunization History  Administered Date(s) Administered  . Influenza, High Dose Seasonal PF 01/02/2013, 01/07/2014, 01/23/2015, 12/01/2015, 01/19/2017, 12/29/2017  . Influenza-Unspecified 12/16/2011, 12/23/2016, 01/23/2017  . Pneumococcal Conjugate-13 01/07/2014  . Pneumococcal Polysaccharide-23 08/07/2015  . Pneumococcal-Unspecified 02/23/1995  . Td 02/22/2005, 07/28/2016  . Zoster 12/16/2011   Preventative care: Last colonoscopy: 2018 due 2021 Last mammogram: 09/2017, patient will schedule DEXA: 08/2016, started on fosamax x 2015, stopped early 2019   Prior vaccinations: TD or Tdap: 2018 Influenza: 2019 Pneumococcal: 2017 Prevnar13: 2015 Shingles/Zostavax: 2013  Names of Other Physician/Practitioners you currently use: 1. [facility]- here for primary care 2. Dr. Leanord Asal, eye doctor, last visit 2020, cataract and glaucoma 3. Dr. Gilford Rile, dentist, last visit 2020, goes q38m  Patient Care Team: Unk Pinto, MD as PCP - General (Internal Medicine) Laurence Spates, MD as Consulting Physician (Gastroenterology) Constance Holster, DDS Jari Pigg, MD as Consulting Physician (Dermatology)  SURGICAL HISTORY She  has a past surgical  history that includes Tonsillectomy; Mandible surgery; and Hip pinning, cannulated (Left, 09/19/2012). FAMILY HISTORY Her family history includes CAD in her maternal grandfather and paternal grandfather; COPD in her father; Cancer in her maternal aunt and paternal aunt; Cancer - Other in her father; Diabetes in her mother; Heart disease in her brother and mother; Hypertension in her mother; Mitral valve prolapse in her mother; Stroke in her father. SOCIAL HISTORY She  reports that she quit smoking about 40 years ago. She has never used smokeless tobacco. She reports current alcohol use of about 14.0 standard drinks of alcohol per week. She reports that she does not use drugs.   MEDICARE WELLNESS  OBJECTIVES: Physical activity: Current Exercise Habits: Home exercise routine, Type of exercise: walking, Time (Minutes): 45, Frequency (Times/Week): 7, Weekly Exercise (Minutes/Week): 315, Intensity: Mild, Exercise limited by: None identified Cardiac risk factors: Cardiac Risk Factors include: advanced age (>26men, >27 women);dyslipidemia Depression/mood screen:   Depression screen [facility] 2/9 09/27/2018  Decreased Interest 0  Down, Depressed, Hopeless 0  PHQ - 2 Score 0    ADLs:  In your present state of health, do you have any difficulty performing the following activities: 09/27/2018 03/29/2018  Hearing? N N  Vision? N N  Difficulty concentrating or making decisions? N N  Walking or climbing stairs? N N  Dressing or bathing? N N  Doing errands, shopping? N N  Some recent data might be hidden     Cognitive Testing  Alert? Yes  Normal Appearance?Yes  Oriented to person? Yes  Place? Yes   Time? Yes  Recall of three objects?  Yes  Can perform simple calculations? Yes  Displays appropriate judgment?Yes  Can read the correct time from a watch face?Yes  EOL planning: Does Patient Have a Medical Advance Directive?: No Would patient like information on creating a medical advance directive?: Yes (MAU/Ambulatory/Procedural Areas - Information given)  Review of Systems  Constitutional: Negative for malaise/fatigue and weight loss.  HENT: Negative for hearing loss and tinnitus.   Eyes: Negative for blurred vision and double vision.  Respiratory: Negative for cough, shortness of breath and wheezing.   Cardiovascular: Negative for chest pain, palpitations, orthopnea, claudication and leg swelling.  Gastrointestinal: Negative for abdominal pain, blood in stool, constipation, diarrhea, heartburn, melena, nausea and vomiting.  Genitourinary: Negative.   Musculoskeletal: Negative for falls, joint pain (right hand pain intermittently, weak grip) and myalgias.  Skin: Negative for rash.  Neurological:  Negative for dizziness, tingling, sensory change, weakness and headaches.  Endo/Heme/Allergies: Negative for polydipsia.  Psychiatric/Behavioral: Negative.   All other systems reviewed and are negative.    Objective:     Today's Vitals   09/27/18 0953  BP: 124/62  Pulse: (!) 59  Temp: (!) 97.3 F (36.3 C)  SpO2: 99%  Weight: 118 lb 12.8 oz (53.9 kg)   Body mass index is 18.33 kg/m.  General appearance: alert, no distress, WD/WN, female HEENT: normocephalic, sclerae anicteric, TMs pearly, nares patent, no discharge or erythema, pharynx normal Oral cavity: MMM, no lesions Neck: supple, no lymphadenopathy, no thyromegaly, no masses Heart: RRR, normal S1, S2, no murmurs Lungs: CTA bilaterally, no wheezes, rhonchi, or rales Abdomen: +bs, soft, non tender, non distended, no masses, no hepatomegaly, no splenomegaly Musculoskeletal: nontender, no swelling; she has reduced ROM of R 1st digit base with mild contracture  Extremities: no edema, no cyanosis, no clubbing Pulses: 2+ symmetric, upper and lower extremities, normal cap refill Neurological: alert, oriented x 3, CN2-12 intact, strength normal upper extremities  and lower extremities, sensation normal throughout, DTRs 2+ throughout, no cerebellar signs, gait normal Psychiatric: normal affect, behavior normal, pleasant   Medicare Attestation I have personally reviewed: The patient's medical and social history Their use of alcohol, tobacco or illicit drugs Their current medications and supplements The patient's functional ability including ADLs,fall risks, home safety risks, cognitive, and hearing and visual impairment Diet and physical activities Evidence for depression or mood disorders  The patient's weight, height, BMI, and visual acuity have been recorded in the chart.  I have made referrals, counseling, and provided education to the patient based on review of the above and I have provided the patient with a written  personalized care plan for preventive services.     Izora Ribas, NP   09/27/2018

## 2018-09-28 LAB — COMPLETE METABOLIC PANEL WITH GFR
AG Ratio: 1.5 (calc) (ref 1.0–2.5)
ALT: 18 U/L (ref 6–29)
AST: 21 U/L (ref 10–35)
Albumin: 4.1 g/dL (ref 3.6–5.1)
Alkaline phosphatase (APISO): 64 U/L (ref 37–153)
BUN: 11 mg/dL (ref 7–25)
CO2: 28 mmol/L (ref 20–32)
Calcium: 9.3 mg/dL (ref 8.6–10.4)
Chloride: 101 mmol/L (ref 98–110)
Creat: 0.66 mg/dL (ref 0.60–0.93)
GFR, Est African American: 103 mL/min/{1.73_m2} (ref >=60)
GFR, Est Non African American: 89 mL/min/{1.73_m2} (ref >=60)
Globulin: 2.7 g/dL (calc) (ref 1.9–3.7)
Glucose, Bld: 101 mg/dL — ABNORMAL HIGH (ref 65–99)
Potassium: 4.4 mmol/L (ref 3.5–5.3)
Sodium: 138 mmol/L (ref 135–146)
Total Bilirubin: 0.4 mg/dL (ref 0.2–1.2)
Total Protein: 6.8 g/dL (ref 6.1–8.1)

## 2018-09-28 LAB — CBC WITH DIFFERENTIAL/PLATELET
Absolute Monocytes: 705 cells/uL (ref 200–950)
Basophils Absolute: 90 cells/uL (ref 0–200)
Basophils Relative: 1.1 %
Eosinophils Absolute: 508 cells/uL — ABNORMAL HIGH (ref 15–500)
Eosinophils Relative: 6.2 %
HCT: 40.2 % (ref 35.0–45.0)
Hemoglobin: 12.9 g/dL (ref 11.7–15.5)
Lymphs Abs: 1640 cells/uL (ref 850–3900)
MCH: 29.1 pg (ref 27.0–33.0)
MCHC: 32.1 g/dL (ref 32.0–36.0)
MCV: 90.5 fL (ref 80.0–100.0)
MPV: 11.7 fL (ref 7.5–12.5)
Monocytes Relative: 8.6 %
Neutro Abs: 5256 cells/uL (ref 1500–7800)
Neutrophils Relative %: 64.1 %
Platelets: 264 10*3/uL (ref 140–400)
RBC: 4.44 10*6/uL (ref 3.80–5.10)
RDW: 12.6 % (ref 11.0–15.0)
Total Lymphocyte: 20 %
WBC: 8.2 10*3/uL (ref 3.8–10.8)

## 2018-09-28 LAB — LIPID PANEL
Cholesterol: 197 mg/dL (ref <200)
HDL: 63 mg/dL (ref >=50)
LDL Cholesterol (Calc): 113 mg/dL (calc) — ABNORMAL HIGH
Non-HDL Cholesterol (Calc): 134 mg/dL (calc) — ABNORMAL HIGH (ref <130)
Total CHOL/HDL Ratio: 3.1 (calc) (ref <5.0)
Triglycerides: 105 mg/dL (ref <150)

## 2018-09-28 LAB — TSH: TSH: 3.08 mIU/L (ref 0.40–4.50)

## 2018-09-29 ENCOUNTER — Other Ambulatory Visit: Payer: Self-pay | Admitting: Adult Health

## 2018-09-29 DIAGNOSIS — Z1231 Encounter for screening mammogram for malignant neoplasm of breast: Secondary | ICD-10-CM

## 2018-12-08 ENCOUNTER — Ambulatory Visit
Admission: RE | Admit: 2018-12-08 | Discharge: 2018-12-08 | Disposition: A | Discharge status: Home / Self Care | Payer: Medicare Other | Source: Ambulatory Visit | Attending: Adult Health | Admitting: Adult Health

## 2018-12-08 ENCOUNTER — Other Ambulatory Visit: Payer: Self-pay

## 2018-12-08 DIAGNOSIS — Z1231 Encounter for screening mammogram for malignant neoplasm of breast: Secondary | ICD-10-CM

## 2018-12-08 DIAGNOSIS — M810 Age-related osteoporosis without current pathological fracture: Secondary | ICD-10-CM

## 2018-12-11 ENCOUNTER — Other Ambulatory Visit: Payer: Self-pay | Admitting: Adult Health

## 2018-12-11 DIAGNOSIS — M810 Age-related osteoporosis without current pathological fracture: Secondary | ICD-10-CM

## 2018-12-11 MED ORDER — ALENDRONATE SODIUM 70 MG PO TABS: ORAL_TABLET | ORAL | 3 refills | Status: DC

## 2018-12-20 ENCOUNTER — Other Ambulatory Visit: Payer: Self-pay

## 2018-12-20 ENCOUNTER — Ambulatory Visit (INDEPENDENT_AMBULATORY_CARE_PROVIDER_SITE_OTHER): Payer: Medicare Other | Admitting: *Deleted

## 2018-12-20 VITALS — Temp 97.2°F

## 2018-12-20 DIAGNOSIS — Z23 Encounter for immunization: Secondary | ICD-10-CM

## 2019-01-04 ENCOUNTER — Ambulatory Visit (INDEPENDENT_AMBULATORY_CARE_PROVIDER_SITE_OTHER): Payer: Medicare Other | Admitting: Internal Medicine

## 2019-01-04 ENCOUNTER — Encounter: Payer: Self-pay | Admitting: Internal Medicine

## 2019-01-04 ENCOUNTER — Other Ambulatory Visit: Payer: Self-pay

## 2019-01-04 VITALS — BP 136/68 | HR 64 | Temp 97.6°F | Resp 16 | Ht 67.5 in | Wt 117.8 lb

## 2019-01-04 DIAGNOSIS — R7309 Other abnormal glucose: Secondary | ICD-10-CM

## 2019-01-04 DIAGNOSIS — Z79899 Other long term (current) drug therapy: Secondary | ICD-10-CM

## 2019-01-04 DIAGNOSIS — E782 Mixed hyperlipidemia: Secondary | ICD-10-CM | POA: Diagnosis not present

## 2019-01-04 DIAGNOSIS — R0989 Other specified symptoms and signs involving the circulatory and respiratory systems: Secondary | ICD-10-CM | POA: Diagnosis not present

## 2019-01-04 DIAGNOSIS — J449 Chronic obstructive pulmonary disease, unspecified: Secondary | ICD-10-CM

## 2019-01-04 DIAGNOSIS — E559 Vitamin D deficiency, unspecified: Secondary | ICD-10-CM

## 2019-01-04 DIAGNOSIS — E039 Hypothyroidism, unspecified: Secondary | ICD-10-CM

## 2019-01-04 NOTE — Progress Notes (Signed)
History of Present Illness:      This very nice 73 y.o. MWF presents for 6 month follow up with labile HTN, HLD, Hypothyroid, Pre-Diabetes and Vitamin D Deficiency.  Patient also has COPD by CXR in 2012 showing hyperinflation. Patient has approx 8-10 yrs smoking hx quitting in the 1980's      Patient is followed expectantly with labile HTN  (2012) & BP has been controlled at home. Today's BP is at goal - 136/68. Patient has had no complaints of any cardiac type chest pain, palpitations, dyspnea / orthopnea / PND, dizziness, claudication, or dependent edema.      Hyperlipidemia is not  controlled with diet & supplements. Last Lipids were not at goal:  Lab Results  Component Value Date   CHOL 197 09/27/2018   HDL 63 09/27/2018   LDLCALC 113 (H) 09/27/2018   TRIG 105 09/27/2018   CHOLHDL 3.1 09/27/2018       In 2015, patient was dx'd Hypothyroid & has been on replacement since.       Also, the patient has history of PreDiabetes (A1c 5.7% / 2012 and 6.0% / 2016)  and has had no symptoms of reactive hypoglycemia, diabetic polys, paresthesias or visual blurring.  Last A1c was normal & at goal: Lab Results  Component Value Date   HGBA1C 5.6 03/29/2018       Further, the patient also has history of Vitamin D Deficiency and supplements vitamin D without any suspected side-effects. Last vitamin D was at goal: Lab Results  Component Value Date   VD25OH 83 03/29/2018    Current Outpatient Medications on File Prior to Visit  Medication Sig  . alendronate (FOSAMAX) 70 MG tablet Take 1 tablet weekly (every 7 days) on an empty stomach with a full glass of water. Do not lie down after taking this medication.  Marland Kitchen aspirin EC 81 MG tablet Take 81 mg by mouth daily.  Marland Kitchen BIOTIN PO Take 1 capsule by mouth daily.  . Calcium Citrate-Vitamin D (CALCIUM CITRATE + D PO) Take by mouth daily.  . Cholecalciferol (VITAMIN D PO) Take 2,000 tablets by mouth daily.   Marland Kitchen CINNAMON PO Take 1,000 capsules by  mouth 2 (two) times daily.   . dorzolamide (TRUSOPT) 2 % ophthalmic solution INSTILL 1 DROP INTO BOTH EYES TWICE A DAY  . fish oil-omega-3 fatty acids 1000 MG capsule Take 1 g by mouth daily.   . Flaxseed, Linseed, (FLAXSEED OIL) 1000 MG CAPS Take 1,000 capsules by mouth daily.  Marland Kitchen latanoprost (XALATAN) 0.005 % ophthalmic solution INSTILL 1 DROP INTO BOTH EYES EVERY DAY AT NIGHT  . levothyroxine (SYNTHROID, LEVOTHROID) 50 MCG tablet TAKE 1 TABLET DAILY ON AN EMPTY STOMACH FOR 30 MIN (Patient taking differently: TAKE 1/2 TABLET DAILY ON AN EMPTY STOMACH FOR 30 MIN)  . Misc Natural Products (OSTEO BI-FLEX JOINT SHIELD PO) Take 1 tablet by mouth 2 (two) times daily.  . Multiple Vitamins-Minerals (MULTIVITAMIN WITH MINERALS) tablet Take 1 tablet by mouth daily.  . Multiple Vitamins-Minerals (PRESERVISION AREDS PO) Take 1 tablet by mouth daily.  Marland Kitchen OVER THE COUNTER MEDICATION 2 capsules daily. Tumeric w/ Black pepper extract  . triamcinolone ointment (KENALOG) 0.5 % Apply to skin lesion 2 x /day and cover with a Bandaid   No current facility-administered medications on file prior to visit.     Allergies  Allergen Reactions  . Silver Sulfadiazine    PMHx:   Past Medical History:  Diagnosis Date  .  Arthritis   . Cancer ([place]) 2016   squamous cell skin cancer   . COPD (chronic obstructive pulmonary disease) ([place])   . Elevated hemoglobin A1c   . Hyperlipidemia   . Labile hypertension   . Osteoporosis   . Villous adenoma 2008   Immunization History  Administered Date(s) Administered  . Influenza, High Dose Seasonal PF 01/02/2013, 01/07/2014, 01/23/2015, 12/01/2015, 01/19/2017, 12/29/2017, 12/20/2018  . Influenza-Unspecified 12/16/2011, 12/23/2016, 01/23/2017  . Pneumococcal Conjugate-13 01/07/2014  . Pneumococcal Polysaccharide-23 08/07/2015  . Pneumococcal-Unspecified 02/23/1995  . Td 02/22/2005, 07/28/2016  . Zoster 12/16/2011   Past Surgical History:  Procedure Laterality Date  .  HIP PINNING,CANNULATED Left 09/19/2012   Procedure: Percutaneous Screw Fixation Left Hip Fracture;  Surgeon: Nita Sells, MD;  Location: [facility];  Service: Orthopedics;  Laterality: Left;  Marland Kitchen MANDIBLE SURGERY    . TONSILLECTOMY     FHx:    Reviewed / unchanged  SHx:    Reviewed / unchanged   Systems Review:  Constitutional: Denies fever, chills, wt changes, headaches, insomnia, fatigue, night sweats, change in appetite. Eyes: Denies redness, blurred vision, diplopia, discharge, itchy, watery eyes.  ENT: Denies discharge, congestion, post nasal drip, epistaxis, sore throat, earache, hearing loss, dental pain, tinnitus, vertigo, sinus pain, snoring.  CV: Denies chest pain, palpitations, irregular heartbeat, syncope, dyspnea, diaphoresis, orthopnea, PND, claudication or edema. Respiratory: denies cough, dyspnea, DOE, pleurisy, hoarseness, laryngitis, wheezing.  Gastrointestinal: Denies dysphagia, odynophagia, heartburn, reflux, water brash, abdominal pain or cramps, nausea, vomiting, bloating, diarrhea, constipation, hematemesis, melena, hematochezia  or hemorrhoids. Genitourinary: Denies dysuria, frequency, urgency, nocturia, hesitancy, discharge, hematuria or flank pain. Musculoskeletal: Denies arthralgias, myalgias, stiffness, jt. swelling, pain, limping or strain/sprain.  Skin: Denies pruritus, rash, hives, warts, acne, eczema or change in skin lesion(s). Neuro: No weakness, tremor, incoordination, spasms, paresthesia or pain. Psychiatric: Denies confusion, memory loss or sensory loss. Endo: Denies change in weight, skin or hair change.  Heme/Lymph: No excessive bleeding, bruising or enlarged lymph nodes.  Physical Exam  BP 136/68   Pulse 64   Temp 97.6 F (36.4 C)   Resp 16   Ht 5' 7.5" (1.715 m)   Wt 117 lb 12.8 oz (53.4 kg)   BMI 18.18 kg/m   Appears  well nourished, well groomed  and in no distress.  Eyes: PERRLA, EOMs, conjunctiva no swelling or erythema.  Sinuses: No frontal/maxillary tenderness ENT/Mouth: EAC's clear, TM's nl w/o erythema, bulging. Nares clear w/o erythema, swelling, exudates. Oropharynx clear without erythema or exudates. Oral hygiene is good. Tongue normal, non obstructing. Hearing intact.  Neck: Supple. Thyroid not palpable. Car 2+/2+ without bruits, nodes or JVD. Chest: Respirations nl with BS clear & equal w/o rales, rhonchi, wheezing or stridor.  Cor: Heart sounds normal w/ regular rate and rhythm without sig. murmurs, gallops, clicks or rubs. Peripheral pulses normal and equal  without edema.  Abdomen: Soft & bowel sounds normal. Non-tender w/o guarding, rebound, hernias, masses or organomegaly.  Lymphatics: Unremarkable.  Musculoskeletal: Full ROM all peripheral extremities, joint stability, 5/5 strength and normal gait.  Skin: Warm, dry without exposed rashes, lesions or ecchymosis apparent.  Neuro: Cranial nerves intact, reflexes equal bilaterally. Sensory-motor testing grossly intact. Tendon reflexes grossly intact.  Pysch: Alert & oriented x 3.  Insight and judgement nl & appropriate. No ideations.  Assessment and Plan:  1. Labile hypertension  - Continue medication, monitor blood pressure at home.  - Continue DASH diet.  Reminder to go to the ER if any CP,  SOB, nausea,  dizziness, severe HA, changes vision/speech.  - CBC with Diff - COMPLETE METABOLIC PANEL WITH GFR - Magnesium - TSH  2. Hyperlipidemia, mixed  - Continue diet/meds, exercise,& lifestyle modifications.  - Continue monitor periodic cholesterol/liver & renal functions   - Lipid Profile  3. Abnormal glucose   - Continue diet, exercise  - Lifestyle modifications.  - Monitor appropriate labs.  - Hemoglobin A1c ([facility]) - Insulin, random  4. Hypothyroidism,  - Continue supplementation.  - TSH  5. Vitamin D deficiency  - Vitamin D (25 hydroxy)  6. Chronic obstructive pulmonary disease ([place])  7. Medication management  - CBC  with Diff - COMPLETE METABOLIC PANEL WITH GFR - Magnesium - Lipid Profile - TSH - Hemoglobin A1c ([facility]) - Insulin, random - Vitamin D (25 hydroxy)        Discussed  regular exercise, BP monitoring, weight control to achieve/maintain BMI less than 25 and discussed med and SE's. Recommended labs to assess and monitor clinical status with further disposition pending results of labs.  I discussed the assessment and treatment plan with the patient. The patient was provided an opportunity to ask questions and all were answered. The patient agreed with the plan and demonstrated an understanding of the instructions.  I provided over 30 minutes of exam, counseling, chart review and  complex critical decision making.  Kirtland Bouchard, MD

## 2019-01-04 NOTE — Patient Instructions (Signed)

## 2019-01-05 ENCOUNTER — Other Ambulatory Visit: Payer: Self-pay | Admitting: Internal Medicine

## 2019-01-05 LAB — COMPLETE METABOLIC PANEL WITH GFR
AG Ratio: 1.5 (calc) (ref 1.0–2.5)
ALT: 18 U/L (ref 6–29)
AST: 20 U/L (ref 10–35)
Albumin: 4.4 g/dL (ref 3.6–5.1)
Alkaline phosphatase (APISO): 66 U/L (ref 37–153)
BUN/Creatinine Ratio: 22 (calc) (ref 6–22)
BUN: 13 mg/dL (ref 7–25)
CO2: 27 mmol/L (ref 20–32)
Calcium: 9.6 mg/dL (ref 8.6–10.4)
Chloride: 101 mmol/L (ref 98–110)
Creat: 0.58 mg/dL — ABNORMAL LOW (ref 0.60–0.93)
GFR, Est African American: 107 mL/min/{1.73_m2} (ref >=60)
GFR, Est Non African American: 93 mL/min/{1.73_m2} (ref >=60)
Globulin: 3 g/dL (calc) (ref 1.9–3.7)
Glucose, Bld: 133 mg/dL — ABNORMAL HIGH (ref 65–99)
Potassium: 4 mmol/L (ref 3.5–5.3)
Sodium: 138 mmol/L (ref 135–146)
Total Bilirubin: 0.5 mg/dL (ref 0.2–1.2)
Total Protein: 7.4 g/dL (ref 6.1–8.1)

## 2019-01-05 LAB — CBC WITH DIFFERENTIAL/PLATELET
Absolute Monocytes: 568 cells/uL (ref 200–950)
Basophils Absolute: 92 cells/uL (ref 0–200)
Basophils Relative: 1.4 %
Eosinophils Absolute: 409 cells/uL (ref 15–500)
Eosinophils Relative: 6.2 %
HCT: 39.3 % (ref 35.0–45.0)
Hemoglobin: 12.8 g/dL (ref 11.7–15.5)
Lymphs Abs: 1650 cells/uL (ref 850–3900)
MCH: 29.1 pg (ref 27.0–33.0)
MCHC: 32.6 g/dL (ref 32.0–36.0)
MCV: 89.3 fL (ref 80.0–100.0)
MPV: 11.9 fL (ref 7.5–12.5)
Monocytes Relative: 8.6 %
Neutro Abs: 3881 cells/uL (ref 1500–7800)
Neutrophils Relative %: 58.8 %
Platelets: 290 10*3/uL (ref 140–400)
RBC: 4.4 10*6/uL (ref 3.80–5.10)
RDW: 12.9 % (ref 11.0–15.0)
Total Lymphocyte: 25 %
WBC: 6.6 10*3/uL (ref 3.8–10.8)

## 2019-01-05 LAB — LIPID PANEL
Cholesterol: 216 mg/dL — ABNORMAL HIGH (ref <200)
HDL: 81 mg/dL (ref >=50)
LDL Cholesterol (Calc): 114 mg/dL (calc) — ABNORMAL HIGH
Non-HDL Cholesterol (Calc): 135 mg/dL (calc) — ABNORMAL HIGH (ref <130)
Total CHOL/HDL Ratio: 2.7 (calc) (ref <5.0)
Triglycerides: 107 mg/dL (ref <150)

## 2019-01-05 LAB — MAGNESIUM: Magnesium: 2.2 mg/dL (ref 1.5–2.5)

## 2019-01-05 LAB — INSULIN, RANDOM: Insulin: 41 u[IU]/mL — ABNORMAL HIGH

## 2019-01-05 LAB — HEMOGLOBIN A1C
Hgb A1c MFr Bld: 5.7 % of total Hgb — ABNORMAL HIGH (ref <5.7)
Mean Plasma Glucose: 117 (calc)
eAG (mmol/L): 6.5 (calc)

## 2019-01-05 LAB — VITAMIN D 25 HYDROXY (VIT D DEFICIENCY, FRACTURES): Vit D, 25-Hydroxy: 83 ng/mL (ref 30–100)

## 2019-01-05 LAB — TSH: TSH: 3.55 mIU/L (ref 0.40–4.50)

## 2019-01-05 MED ORDER — ROSUVASTATIN CALCIUM 5 MG PO TABS: ORAL_TABLET | ORAL | 3 refills | Status: DC

## 2019-01-07 ENCOUNTER — Encounter: Payer: Self-pay | Admitting: Internal Medicine

## 2019-03-20 ENCOUNTER — Ambulatory Visit: Payer: Medicare Other

## 2019-03-29 ENCOUNTER — Ambulatory Visit: Payer: Medicare Other

## 2019-04-02 ENCOUNTER — Ambulatory Visit: Payer: Medicare PPO | Attending: Internal Medicine

## 2019-04-02 DIAGNOSIS — Z23 Encounter for immunization: Secondary | ICD-10-CM | POA: Insufficient documentation

## 2019-04-04 ENCOUNTER — Encounter: Payer: Self-pay | Admitting: Internal Medicine

## 2019-04-06 ENCOUNTER — Ambulatory Visit: Payer: Medicare Other

## 2019-04-14 ENCOUNTER — Encounter: Payer: Self-pay | Admitting: Internal Medicine

## 2019-04-14 NOTE — Progress Notes (Signed)
Annual Screening/Preventative Visit & Comprehensive Evaluation &  Examination     This very nice 74 y.o.  MWF presents for a Screening /Preventative Visit & comprehensive evaluation and management of multiple medical co-morbidities.  Patient has been followed for Labile HTN, HLD, Prediabetes  and Vitamin D Deficiency.     Patient has hx/o Osteoporosis and had been off of fosamax from 2019 until dexaBMD in Oct 2020 showed worsening with T -3.3 and Fosamax was restarted.       Labile HTN predates since 2012. Patient's BP has been controlled at home and patient denies any cardiac symptoms as chest pain, palpitations, shortness of breath, dizziness or ankle swelling. Today's BP is elevated at 158/76 and rechecked x 2 w/ wrist cuff & wall cuff at 150/78.     Patient's hyperlipidemia is controlled with diet and medications. Patient denies myalgias or other medication SE's. Last lipids were not at goal:  Lab Results  Component Value Date   CHOL 216 (H) 01/04/2019   HDL 81 01/04/2019   LDLCALC 114 (H) 01/04/2019   TRIG 107 01/04/2019   CHOLHDL 2.7 01/04/2019       Patient has hx/o prediabetes (A1c 5.7% / 2012 & 6.0% / 2016)   and patient denies reactive hypoglycemic symptoms, visual blurring, diabetic polys or paresthesias. Last A1c was near goal:  Lab Results  Component Value Date   HGBA1C 5.7 (H) 01/04/2019        Patient was dx'd Hypothyroid in 2015 and initiated on  Thyroid Replacement.      Finally, patient has history of Vitamin D Deficiency and last Vitamin D was at goal:  Lab Results  Component Value Date   VD25OH 83 01/04/2019    Current Outpatient Medications on File Prior to Visit  Medication Sig  . alendronate (FOSAMAX) 70 MG tablet Take 1 tablet weekly (every 7 days) on an empty stomach with a full glass of water. Do not lie down after taking this medication.  . Ascorbic Acid (VITAMIN C PO) Take 1 tablet by mouth daily.  Marland Kitchen aspirin EC 81 MG tablet Take 81 mg by mouth  daily.  Marland Kitchen BIOTIN PO Take 1 capsule by mouth daily.  . Calcium Citrate-Vitamin D (CALCIUM CITRATE + D PO) Take by mouth daily.  . Cholecalciferol (VITAMIN D PO) Take 2,000 tablets by mouth daily.   Marland Kitchen CINNAMON PO Take 1,000 capsules by mouth 2 (two) times daily.   . dorzolamide (TRUSOPT) 2 % ophthalmic solution INSTILL 1 DROP INTO BOTH EYES TWICE A DAY  . fish oil-omega-3 fatty acids 1000 MG capsule Take 1 g by mouth daily.   . Flaxseed, Linseed, (FLAXSEED OIL) 1000 MG CAPS Take 1,000 capsules by mouth daily.  Marland Kitchen latanoprost (XALATAN) 0.005 % ophthalmic solution INSTILL 1 DROP INTO BOTH EYES EVERY DAY AT NIGHT  . levothyroxine (SYNTHROID, LEVOTHROID) 50 MCG tablet TAKE 1 TABLET DAILY ON AN EMPTY STOMACH FOR 30 MIN (Patient taking differently: TAKE 1/2 TABLET DAILY ON AN EMPTY STOMACH FOR 30 MIN)  . Misc Natural Products (OSTEO BI-FLEX JOINT SHIELD PO) Take 1 tablet by mouth 2 (two) times daily.  . Multiple Vitamins-Minerals (MULTIVITAMIN WITH MINERALS) tablet Take 1 tablet by mouth daily.  . Multiple Vitamins-Minerals (PRESERVISION AREDS PO) Take 1 tablet by mouth daily.  Marland Kitchen OVER THE COUNTER MEDICATION 2 capsules daily. Tumeric w/ Black pepper extract  . rosuvastatin (CRESTOR) 5 MG tablet Take 1 tablet Daily for Cholesterol  . triamcinolone ointment (KENALOG) 0.5 % Apply to skin lesion  2 x /day and cover with a Bandaid  . zinc gluconate 50 MG tablet Take 50 mg by mouth daily.   No current facility-administered medications on file prior to visit.   Allergies  Allergen Reactions  . Silver Sulfadiazine    Past Medical History:  Diagnosis Date  . Arthritis   . Cancer ([place]) 2016   squamous cell skin cancer   . COPD (chronic obstructive pulmonary disease) ([place])   . Elevated hemoglobin A1c   . Hyperlipidemia   . Labile hypertension   . Osteoporosis   . Villous adenoma 2008   Health Maintenance  Topic Date Due  . COLONOSCOPY  10/22/2019  . MAMMOGRAM  12/07/2020  . TETANUS/TDAP  07/29/2026   . INFLUENZA VACCINE  Completed  . DEXA SCAN  Completed  . Hepatitis C Screening  Completed  . PNA vac Low Risk Adult  Completed   Immunization History  Administered Date(s) Administered  . Influenza, High Dose Seasonal PF 01/02/2013, 01/07/2014, 01/23/2015, 12/01/2015, 01/19/2017, 12/29/2017, 12/20/2018  . Influenza-Unspecified 12/16/2011, 12/23/2016, 01/23/2017  . PFIZER SARS-COV-2 Vaccination 04/02/2019  . Pneumococcal Conjugate-13 01/07/2014  . Pneumococcal Polysaccharide-23 08/07/2015  . Pneumococcal-Unspecified 02/23/1995  . Td 02/22/2005, 07/28/2016  . Zoster 12/16/2011    Last Colon - 09/24/2016 - Dr Daine Gravel - recommended 3 year f/u Colon - due Aug 2021  Last MGM - 12/08/2018  Past Surgical History:  Procedure Laterality Date  . HIP PINNING,CANNULATED Left 09/19/2012   Procedure: Percutaneous Screw Fixation Left Hip Fracture;  Surgeon: Nita Sells, MD;  Location: [facility];  Service: Orthopedics;  Laterality: Left;  Marland Kitchen MANDIBLE SURGERY    . TONSILLECTOMY     Family History  Problem Relation Age of Onset  . Hypertension Mother   . Diabetes Mother   . Mitral valve prolapse Mother   . Heart disease Mother   . Cancer - Other Father        Leukemia and Melanoma  . COPD Father   . Stroke Father   . CAD Paternal Grandfather   . CAD Maternal Grandfather   . Heart disease Brother   . Cancer Maternal Aunt        colon  . Cancer Paternal Aunt        colon   Social History   Tobacco Use  . Smoking status: Former Smoker    Quit date: 02/22/1978    Years since quitting: 41.1  . Smokeless tobacco: Never Used  Substance Use Topics  . Alcohol use: Yes    Alcohol/week: 14.0 standard drinks    Types: 14 Standard drinks or equivalent per week  . Drug use: No    ROS Constitutional: Denies fever, chills, weight loss/gain, headaches, insomnia,  night sweats, and change in appetite. Does c/o fatigue. Eyes: Denies redness, blurred vision, diplopia, discharge,  itchy, watery eyes.  ENT: Denies discharge, congestion, post nasal drip, epistaxis, sore throat, earache, hearing loss, dental pain, Tinnitus, Vertigo, Sinus pain, snoring.  Cardio: Denies chest pain, palpitations, irregular heartbeat, syncope, dyspnea, diaphoresis, orthopnea, PND, claudication, edema Respiratory: denies cough, dyspnea, DOE, pleurisy, hoarseness, laryngitis, wheezing.  Gastrointestinal: Denies dysphagia, heartburn, reflux, water brash, pain, cramps, nausea, vomiting, bloating, diarrhea, constipation, hematemesis, melena, hematochezia, jaundice, hemorrhoids Genitourinary: Denies dysuria, frequency, urgency, nocturia, hesitancy, discharge, hematuria, flank pain Breast: Breast lumps, nipple discharge, bleeding.  Musculoskeletal: Denies arthralgia, myalgia, stiffness, Jt. Swelling, pain, limp, and strain/sprain. Denies falls. Skin: Denies puritis, rash, hives, warts, acne, eczema, changing in skin lesion Neuro: No weakness, tremor, incoordination,  spasms, paresthesia, pain Psychiatric: Denies confusion, memory loss, sensory loss. Denies Depression. Endocrine: Denies change in weight, skin, hair change, nocturia, and paresthesia, diabetic polys, visual blurring, hyper / hypo glycemic episodes.  Heme/Lymph: No excessive bleeding, bruising, enlarged lymph nodes.  Physical Exam  BP (!) 158/76   Pulse (!) 56   Temp (!) 97 F (36.1 C)   Resp 16   Ht 5' 7.5" (1.715 m)   Wt 117 lb (53.1 kg)   BMI 18.05 kg/m   General Appearance: Well nourished, well groomed and in no apparent distress.  Eyes: PERRLA, EOMs, conjunctiva no swelling or erythema, normal fundi and vessels. Sinuses: No frontal/maxillary tenderness ENT/Mouth: EACs patent / TMs  nl. Nares clear without erythema, swelling, mucoid exudates. Oral hygiene is good. No erythema, swelling, or exudate. Tongue normal, non-obstructing. Tonsils not swollen or erythematous. Hearing normal.  Neck: Supple, thyroid not palpable. No  bruits, nodes or JVD. Respiratory: Respiratory effort normal.  BS equal and clear bilateral without rales, rhonci, wheezing or stridor. Cardio: Heart sounds are normal with regular rate and rhythm and no murmurs, rubs or gallops. Peripheral pulses are normal and equal bilaterally without edema. No aortic or femoral bruits. Chest: symmetric with normal excursions and percussion. Breasts: Symmetric, without lumps, nipple discharge, retractions, or fibrocystic changes.  Abdomen: Flat, soft with bowel sounds active. Nontender, no guarding, rebound, hernias, masses, or organomegaly.  Lymphatics: Non tender without lymphadenopathy.  Genitourinary:  Musculoskeletal: Full ROM all peripheral extremities, joint stability, 5/5 strength, and normal gait. Skin: Warm and dry without rashes, lesions, cyanosis, clubbing or  ecchymosis.  Neuro: Cranial nerves intact, reflexes equal bilaterally. Normal muscle tone, no cerebellar symptoms. Sensation intact.  Pysch: Alert and oriented X 3, normal affect, Insight and Judgment appropriate.   Assessment and Plan  1. Annual Preventative Screening Examination  2. Labile hypertension  - EKG 12-Lead - CBC with Differential/Platelet - COMPLETE METABOLIC PANEL WITH GFR - Magnesium - TSH - Microalbumin / creatinine urine ratio  3. Hyperlipidemia, mixed  - EKG 12-Lead - Lipid panel - TSH  4. Abnormal glucose  - EKG 12-Lead - Hemoglobin A1c - Insulin, random  5. Vitamin D deficiency  - VITAMIN D 25 Hydroxy  6. Hypothyroidism  - TSH  7. Age-related osteoporosis w/o current pathological fracture   8. Screening for colorectal cancer  - POC Hemoccult Bld/Stl  9. Screening for ischemic heart disease  - EKG 12-Lead  10. FHx: heart disease  - EKG 12-Lead  11. Former smoker  - EKG 12-Lead  12. Medication management  - Urinalysis, Routine w reflex microscopic - CBC with Differential/Platelet - COMPLETE METABOLIC PANEL WITH GFR -  Magnesium - Lipid panel - TSH - Hemoglobin A1c - Insulin, random - Microalbumin / creatinine urine ratio - VITAMIN D        Patient was counseled in prudent diet to achieve/maintain BMI less than 25 for weight control, BP monitoring, regular exercise and medications. Discussed med's effects and SE's. Screening labs and tests as requested with regular follow-up as recommended. Over 40 minutes of exam, counseling, chart review and high complex critical decision making was performed.   Kirtland Bouchard, MD

## 2019-04-14 NOTE — Patient Instructions (Signed)

## 2019-04-16 ENCOUNTER — Other Ambulatory Visit: Payer: Self-pay

## 2019-04-16 ENCOUNTER — Ambulatory Visit: Payer: Medicare PPO | Admitting: Internal Medicine

## 2019-04-16 VITALS — BP 158/76 | HR 56 | Temp 97.0°F | Resp 16 | Ht 67.5 in | Wt 117.0 lb

## 2019-04-16 DIAGNOSIS — Z0000 Encounter for general adult medical examination without abnormal findings: Secondary | ICD-10-CM

## 2019-04-16 DIAGNOSIS — R0989 Other specified symptoms and signs involving the circulatory and respiratory systems: Secondary | ICD-10-CM | POA: Diagnosis not present

## 2019-04-16 DIAGNOSIS — Z0001 Encounter for general adult medical examination with abnormal findings: Secondary | ICD-10-CM

## 2019-04-16 DIAGNOSIS — Z79899 Other long term (current) drug therapy: Secondary | ICD-10-CM | POA: Diagnosis not present

## 2019-04-16 DIAGNOSIS — E559 Vitamin D deficiency, unspecified: Secondary | ICD-10-CM

## 2019-04-16 DIAGNOSIS — R7309 Other abnormal glucose: Secondary | ICD-10-CM

## 2019-04-16 DIAGNOSIS — Z1211 Encounter for screening for malignant neoplasm of colon: Secondary | ICD-10-CM

## 2019-04-16 DIAGNOSIS — E039 Hypothyroidism, unspecified: Secondary | ICD-10-CM

## 2019-04-16 DIAGNOSIS — Z8249 Family history of ischemic heart disease and other diseases of the circulatory system: Secondary | ICD-10-CM

## 2019-04-16 DIAGNOSIS — E782 Mixed hyperlipidemia: Secondary | ICD-10-CM

## 2019-04-16 DIAGNOSIS — Z87891 Personal history of nicotine dependence: Secondary | ICD-10-CM | POA: Diagnosis not present

## 2019-04-16 DIAGNOSIS — Z136 Encounter for screening for cardiovascular disorders: Secondary | ICD-10-CM | POA: Diagnosis not present

## 2019-04-16 DIAGNOSIS — M810 Age-related osteoporosis without current pathological fracture: Secondary | ICD-10-CM

## 2019-04-17 ENCOUNTER — Other Ambulatory Visit: Payer: Self-pay | Admitting: Internal Medicine

## 2019-04-17 DIAGNOSIS — E782 Mixed hyperlipidemia: Secondary | ICD-10-CM

## 2019-04-17 LAB — COMPLETE METABOLIC PANEL WITH GFR
AG Ratio: 1.6 (calc) (ref 1.0–2.5)
ALT: 16 U/L (ref 6–29)
AST: 19 U/L (ref 10–35)
Albumin: 4.3 g/dL (ref 3.6–5.1)
Alkaline phosphatase (APISO): 65 U/L (ref 37–153)
BUN/Creatinine Ratio: 22 (calc) (ref 6–22)
BUN: 12 mg/dL (ref 7–25)
CO2: 28 mmol/L (ref 20–32)
Calcium: 9.6 mg/dL (ref 8.6–10.4)
Chloride: 103 mmol/L (ref 98–110)
Creat: 0.55 mg/dL — ABNORMAL LOW (ref 0.60–0.93)
GFR, Est African American: 109 mL/min/{1.73_m2} (ref >=60)
GFR, Est Non African American: 94 mL/min/{1.73_m2} (ref >=60)
Globulin: 2.7 g/dL (calc) (ref 1.9–3.7)
Glucose, Bld: 72 mg/dL (ref 65–99)
Potassium: 4.1 mmol/L (ref 3.5–5.3)
Sodium: 140 mmol/L (ref 135–146)
Total Bilirubin: 0.5 mg/dL (ref 0.2–1.2)
Total Protein: 7 g/dL (ref 6.1–8.1)

## 2019-04-17 LAB — MICROALBUMIN / CREATININE URINE RATIO
Creatinine, Urine: 11 mg/dL — ABNORMAL LOW (ref 20–275)
Microalb, Ur: <0.2 mg/dL

## 2019-04-17 LAB — LIPID PANEL
Cholesterol: 224 mg/dL — ABNORMAL HIGH (ref <200)
HDL: 78 mg/dL (ref >=50)
LDL Cholesterol (Calc): 123 mg/dL (calc) — ABNORMAL HIGH
Non-HDL Cholesterol (Calc): 146 mg/dL (calc) — ABNORMAL HIGH (ref <130)
Total CHOL/HDL Ratio: 2.9 (calc) (ref <5.0)
Triglycerides: 121 mg/dL (ref <150)

## 2019-04-17 LAB — CBC WITH DIFFERENTIAL/PLATELET
Absolute Monocytes: 618 cells/uL (ref 200–950)
Basophils Absolute: 72 cells/uL (ref 0–200)
Basophils Relative: 1.1 %
Eosinophils Absolute: 338 cells/uL (ref 15–500)
Eosinophils Relative: 5.2 %
HCT: 38.5 % (ref 35.0–45.0)
Hemoglobin: 12.6 g/dL (ref 11.7–15.5)
Lymphs Abs: 1820 cells/uL (ref 850–3900)
MCH: 29.6 pg (ref 27.0–33.0)
MCHC: 32.7 g/dL (ref 32.0–36.0)
MCV: 90.4 fL (ref 80.0–100.0)
MPV: 12 fL (ref 7.5–12.5)
Monocytes Relative: 9.5 %
Neutro Abs: 3653 cells/uL (ref 1500–7800)
Neutrophils Relative %: 56.2 %
Platelets: 260 10*3/uL (ref 140–400)
RBC: 4.26 10*6/uL (ref 3.80–5.10)
RDW: 12.2 % (ref 11.0–15.0)
Total Lymphocyte: 28 %
WBC: 6.5 10*3/uL (ref 3.8–10.8)

## 2019-04-17 LAB — URINALYSIS, ROUTINE W REFLEX MICROSCOPIC
Bilirubin Urine: NEGATIVE
Glucose, UA: NEGATIVE
Hgb urine dipstick: NEGATIVE
Ketones, ur: NEGATIVE
Leukocytes,Ua: NEGATIVE
Nitrite: NEGATIVE
Protein, ur: NEGATIVE
Specific Gravity, Urine: 1.005 (ref 1.001–1.03)
pH: 7.5 (ref 5.0–8.0)

## 2019-04-17 LAB — HEMOGLOBIN A1C
Hgb A1c MFr Bld: 5.6 % of total Hgb (ref <5.7)
Mean Plasma Glucose: 114 (calc)
eAG (mmol/L): 6.3 (calc)

## 2019-04-17 LAB — INSULIN, RANDOM: Insulin: 3.2 u[IU]/mL

## 2019-04-17 LAB — MAGNESIUM: Magnesium: 2.5 mg/dL (ref 1.5–2.5)

## 2019-04-17 LAB — TSH: TSH: 4.12 mIU/L (ref 0.40–4.50)

## 2019-04-17 LAB — VITAMIN D 25 HYDROXY (VIT D DEFICIENCY, FRACTURES): Vit D, 25-Hydroxy: 80 ng/mL (ref 30–100)

## 2019-04-17 MED ORDER — EZETIMIBE 10 MG PO TABS: ORAL_TABLET | ORAL | 3 refills | Status: DC

## 2019-04-18 ENCOUNTER — Encounter: Payer: Self-pay | Admitting: Internal Medicine

## 2019-04-27 ENCOUNTER — Ambulatory Visit: Payer: Medicare PPO | Attending: Internal Medicine

## 2019-04-27 DIAGNOSIS — Z23 Encounter for immunization: Secondary | ICD-10-CM | POA: Insufficient documentation

## 2019-04-27 NOTE — Progress Notes (Signed)
   Covid-19 Vaccination Clinic  Name:  SHALECIA HOOP    MRN: [id number] DOB: [birth year]  04/27/2019  Ms. Blanche was observed post Covid-19 immunization for 15 minutes without incident. She was provided with Vaccine Information Sheet and instruction to access the V-Safe system.   Ms. Economou was instructed to call 911 with any severe reactions post vaccine: Marland Kitchen Difficulty breathing  . Swelling of face and throat  . A fast heartbeat  . A bad rash all over body  . Dizziness and weakness   Immunizations Administered    Name Date Dose VIS Date Route   Pfizer COVID-19 Vaccine 04/27/2019  6:25 PM 0.3 mL 02/02/2019 Intramuscular   Manufacturer: [place]   Lot: [id number]   [place]: [id number]

## 2019-05-03 DIAGNOSIS — Z01419 Encounter for gynecological examination (general) (routine) without abnormal findings: Secondary | ICD-10-CM | POA: Diagnosis not present

## 2019-06-09 ENCOUNTER — Other Ambulatory Visit: Payer: Self-pay | Admitting: Internal Medicine

## 2019-07-19 ENCOUNTER — Ambulatory Visit: Payer: Medicare PPO | Admitting: Adult Health

## 2019-07-24 NOTE — Progress Notes (Signed)
MEDICARE ANNUAL WELLNESS VISIT AND FOLLOW UP  Assessment:    Annual Medicare annual wellness visit UTD, colonoscopy due 09/2019, has number to call Discussed shingrix, will check with insurance  Hypothyroidism, unspecified hypothyroidism type -cont levothyroxine - TSH   Labile hypertension -cont monitoring -currently managed by diet and exericse - goal <130/80 - TSH   Hyperlipidemia -diet and exercise , high fiber discussed -check lipid panel - low risk, start statin if LDL trending 130+  Medication management - CBC with Differential/Platelet - CMP/GFR  Chronic obstructive pulmonary disease, unspecified COPD type ([place]) -former smoker, no active sx, cont to monitor -no current treatment needed  Osteoporosis -back on fosamax 11/2018  -Bone Density scan in 2 years   Elevated hemoglobin A1c -check a1c at CPE -diet and exercise as tolerated   Vitamin D deficiency At goal at recent check; continue to recommend supplementation for goal of 70-100 Defer vitamin D level  Personal history of colon polyps Due follow up colonoscopy 09/2019 per Dr. Oletta Lamas, has number to call    BMI less than 19,adult -exercise as tolerated -cont increased calorie load   Over 40 minutes of exam, counseling, chart review and critical decision making was performed Future Appointments  Date Time Provider [place]  10/23/2019  2:30 PM Unk Pinto, MD [facility] None  05/01/2020  2:00 PM Unk Pinto, MD [facility] None     Plan:   During the course of the visit the patient was educated and counseled about appropriate screening and preventive services including:    Pneumococcal vaccine   Prevnar 13  Influenza vaccine  Td vaccine  Screening electrocardiogram  Bone densitometry screening  Colorectal cancer screening  Diabetes screening  Glaucoma screening  Nutrition counseling   Advanced directives: requested   Subjective:  Katie Morton is a 74  y.o. female who presents for Medicare Annual Wellness Visit and 3 month follow up.   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   Follows annually with Dr. Jari Pigg for history of SCC.   BMI is Body mass index is 18.21 kg/m., she has been working on diet and exercise. Wt Readings from Last 3 Encounters:  07/25/19 118 lb (53.5 kg)  04/16/19 117 lb (53.1 kg)  01/04/19 117 lb 12.8 oz (53.4 kg)    Her blood pressure has been controlled at home (130s/70s), today their BP is BP: 126/74 She does workout. She denies chest pain, shortness of breath, dizziness.   She is not on cholesterol medication (taking omega 3 supplement, was prescribed rosuvastatin and zetia but never started per personal preference, would consider if LDL trending 130+) and denies myalgias. Her cholesterol is not at goal. The cholesterol last visit was:   Lab Results  Component Value Date   CHOL 224 (H) 04/16/2019   HDL 78 04/16/2019   LDLCALC 123 (H) 04/16/2019   TRIG 121 04/16/2019   CHOLHDL 2.9 04/16/2019    She has been working on diet and exercise for hx of prediabetes recently well controlled, and denies increased appetite, nausea, paresthesia of the feet, polydipsia, polyuria and visual disturbances. Last A1C in the office was:  Lab Results  Component Value Date   HGBA1C 5.6 04/16/2019   She is on thyroid medication. Her medication was not changed last visit.  Taking 25 mcg daily.  Lab Results  Component Value Date   TSH 4.12 04/16/2019   Last GFR: Lab Results  Component Value Date   GFRNONAA 94 04/16/2019  Patient is on Vitamin D supplement.   Lab Results  Component Value Date   VD25OH 80 04/16/2019      Medication Review: Current Outpatient Medications on File Prior to Visit  Medication Sig Dispense Refill  . alendronate (FOSAMAX) 70 MG tablet Take 1 tablet weekly (every 7 days) on an empty stomach with a full glass of water. Do not lie down after taking  this medication. 12 tablet 3  . Ascorbic Acid (VITAMIN C PO) Take 1 tablet by mouth daily.    Marland Kitchen aspirin EC 81 MG tablet Take 81 mg by mouth daily.    Marland Kitchen BIOTIN PO Take 1 capsule by mouth daily.    . Calcium Citrate-Vitamin D (CALCIUM CITRATE + D PO) Take by mouth daily.    . Cholecalciferol (VITAMIN D PO) Take 2,000 tablets by mouth daily.     Marland Kitchen CINNAMON PO Take 1,000 capsules by mouth 2 (two) times daily.     . dorzolamide (TRUSOPT) 2 % ophthalmic solution INSTILL 1 DROP INTO BOTH EYES TWICE A DAY  11  . fish oil-omega-3 fatty acids 1000 MG capsule Take 1 g by mouth daily.     . Flaxseed, Linseed, (FLAXSEED OIL) 1000 MG CAPS Take 1,000 capsules by mouth daily.    Marland Kitchen latanoprost (XALATAN) 0.005 % ophthalmic solution INSTILL 1 DROP INTO BOTH EYES EVERY DAY AT NIGHT  11  . levothyroxine (SYNTHROID) 50 MCG tablet TAKE 1/2 TABLET DAILY ON AN EMPTY STOMACH FOR 30 MIN 90 tablet 1  . Misc Natural Products (OSTEO BI-FLEX JOINT SHIELD PO) Take 1 tablet by mouth 2 (two) times daily.    . Multiple Vitamins-Minerals (MULTIVITAMIN WITH MINERALS) tablet Take 1 tablet by mouth daily.    . Multiple Vitamins-Minerals (PRESERVISION AREDS PO) Take 1 tablet by mouth daily.    Marland Kitchen OVER THE COUNTER MEDICATION 2 capsules daily. Tumeric w/ Black pepper extract    . zinc gluconate 50 MG tablet Take 50 mg by mouth daily.     No current facility-administered medications on file prior to visit.    Allergies  Allergen Reactions  . Silver Sulfadiazine     Current Problems (verified) Patient Active Problem List   Diagnosis Date Noted  . Personal history of colonic polyps 07/25/2019  . History of squamous cell carcinoma 07/25/2019  . Protein-calorie malnutrition ([place]) 02/28/2018  . BMI less than 19,adult 01/23/2015  . Encounter for Medicare annual wellness exam 01/23/2015  . Hypothyroidism 06/11/2014  . Medication management 01/01/2013  . Vitamin D deficiency 01/01/2013  . Hyperlipidemia   . Blood pressure  elevated without history of HTN   . Abnormal glucose   . Osteoporosis 09/20/2012  . COPD (chronic obstructive pulmonary disease) ([facility])     Screening Tests Immunization History  Administered Date(s) Administered  . Influenza, High Dose Seasonal PF 01/02/2013, 01/07/2014, 01/23/2015, 12/01/2015, 01/19/2017, 12/29/2017, 12/20/2018  . Influenza-Unspecified 12/16/2011, 12/23/2016, 01/23/2017  . PFIZER SARS-COV-2 Vaccination 04/02/2019, 04/27/2019  . Pneumococcal Conjugate-13 01/07/2014  . Pneumococcal Polysaccharide-23 08/07/2015  . Pneumococcal-Unspecified 02/23/1995  . Td 02/22/2005, 07/28/2016  . Zoster 12/16/2011   Preventative care: Last colonoscopy: 2018 due 09/2019 per Dr. Oletta Lamas at [facility] Last mammogram: 11/2018 DEXA: 08/2016, started on fosamax x 2015, stopped early 2019, last DEXA 11/2018 with radius T score -3.3, restarted fosamax 11/2018, repeat 11/2020  Prior vaccinations: TD or Tdap: 2018 Influenza: 11/2018 Pneumococcal: 2017 Prevnar13: 2015 Shingles/Zostavax: 2013 Covid 19: 2/2, 2021, pfizer  Names of Other Physician/Practitioners you currently use: 1. [facility]- here for primary care 2. Dr. Leanord Asal, eye doctor, last visit 2021, monitoring cataract and glaucoma 3. Dr. Gilford Rile, dentist, last visit 2021, goes q43m  Patient Care Team: Unk Pinto, MD as PCP - General (Internal Medicine) Laurence Spates, MD (Inactive) as Consulting Physician (Gastroenterology) Constance Holster, DDS Jari Pigg, MD as Consulting Physician (Dermatology) Roseanne Kaufman, MD as Consulting Physician (Orthopedic Surgery)  SURGICAL HISTORY She  has a past surgical history that includes Tonsillectomy; Mandible surgery; and Hip pinning, cannulated (Left, 09/19/2012). FAMILY HISTORY Her family history includes CAD in her maternal grandfather and paternal grandfather; COPD in her father; Cancer in her maternal aunt and paternal aunt; Cancer - Other in her  father; Diabetes in her mother; Heart disease in her brother and mother; Hypertension in her mother; Mitral valve prolapse in her mother; Stroke in her father. SOCIAL HISTORY She  reports that she quit smoking about 41 years ago. She has never used smokeless tobacco. She reports current alcohol use of about 14.0 standard drinks of alcohol per week. She reports that she does not use drugs.   MEDICARE WELLNESS OBJECTIVES: Physical activity: Current Exercise Habits: Home exercise routine, Type of exercise: walking, Time (Minutes): 50, Frequency (Times/Week): 7, Weekly Exercise (Minutes/Week): 350, Intensity: Mild, Exercise limited by: None identified Cardiac risk factors: Cardiac Risk Factors include: advanced age (>50men, >46 women);hypertension;dyslipidemia;smoking/ tobacco exposure Depression/mood screen:   Depression screen [facility] 2/9 07/25/2019  Decreased Interest 0  Down, Depressed, Hopeless 0  PHQ - 2 Score 0    ADLs:  In your present state of health, do you have any difficulty performing the following activities: 07/25/2019 04/14/2019  Hearing? N N  Vision? N N  Difficulty concentrating or making decisions? N N  Walking or climbing stairs? N N  Dressing or bathing? N N  Doing errands, shopping? N N  Some recent data might be hidden     Cognitive Testing  Alert? Yes  Normal Appearance?Yes  Oriented to person? Yes  Place? Yes   Time? Yes  Recall of three objects?  Yes  Can perform simple calculations? Yes  Displays appropriate judgment?Yes  Can read the correct time from a watch face?Yes  EOL planning: Does Patient Have a Medical Advance Directive?: Yes Type of Advance Directive: Healthcare Power of Attorney, Living will Does patient want to make changes to medical advance directive?: No - Patient declined Copy of [place] in Chart?: No - copy requested  Review of Systems  Constitutional: Negative for malaise/fatigue and weight loss.  HENT: Negative for hearing  loss and tinnitus.   Eyes: Negative for blurred vision and double vision.  Respiratory: Negative for cough, shortness of breath and wheezing.   Cardiovascular: Negative for chest pain, palpitations, orthopnea, claudication and leg swelling.  Gastrointestinal: Negative for abdominal pain, blood in stool, constipation, diarrhea, heartburn, melena, nausea and vomiting.  Genitourinary: Negative.   Musculoskeletal: Negative for falls, joint pain (right hand pain intermittently, weak grip) and myalgias.  Skin: Negative for rash.  Neurological: Negative for dizziness, tingling, sensory change, weakness and headaches.  Endo/Heme/Allergies: Negative for polydipsia.  Psychiatric/Behavioral: Negative.   All other systems reviewed and are negative.    Objective:     Today's Vitals   07/25/19 1513 07/25/19 1542  BP: (!) 142/64 126/74  Pulse: 92   Temp: (!) 96.8 F (36 C)   SpO2: 92%   Weight: 118 lb (53.5 kg)   Height: 5' 7.5" (1.715 m)    Body mass index is 18.21  kg/m.  General appearance: alert, no distress, WD/WN, female HEENT: normocephalic, sclerae anicteric, TMs pearly, nares patent, no discharge or erythema, pharynx normal Oral cavity: MMM, no lesions Neck: supple, no lymphadenopathy, no thyromegaly, no masses Heart: RRR, normal S1, S2, no murmurs Lungs: CTA bilaterally, no wheezes, rhonchi, or rales Abdomen: +bs, soft, non tender, non distended, no masses, no hepatomegaly, no splenomegaly Musculoskeletal: nontender, no swelling; she has reduced ROM of R 1st digit base with mild contracture  Extremities: no edema, no cyanosis, no clubbing Pulses: 2+ symmetric, upper and lower extremities, normal cap refill Neurological: alert, oriented x 3, CN2-12 intact, strength normal upper extremities and lower extremities, sensation normal throughout, DTRs 2+ throughout, no cerebellar signs, gait normal Psychiatric: normal affect, behavior normal, pleasant   Medicare Attestation I have  personally reviewed: The patient's medical and social history Their use of alcohol, tobacco or illicit drugs Their current medications and supplements The patient's functional ability including ADLs,fall risks, home safety risks, cognitive, and hearing and visual impairment Diet and physical activities Evidence for depression or mood disorders  The patient's weight, height, BMI, and visual acuity have been recorded in the chart.  I have made referrals, counseling, and provided education to the patient based on review of the above and I have provided the patient with a written personalized care plan for preventive services.     Izora Ribas, NP   07/25/2019

## 2019-07-25 ENCOUNTER — Other Ambulatory Visit: Payer: Self-pay

## 2019-07-25 ENCOUNTER — Encounter: Payer: Self-pay | Admitting: Adult Health

## 2019-07-25 ENCOUNTER — Ambulatory Visit: Payer: Medicare PPO | Admitting: Adult Health

## 2019-07-25 VITALS — BP 126/74 | HR 92 | Temp 96.8°F | Ht 67.5 in | Wt 118.0 lb

## 2019-07-25 DIAGNOSIS — R6889 Other general symptoms and signs: Secondary | ICD-10-CM

## 2019-07-25 DIAGNOSIS — E782 Mixed hyperlipidemia: Secondary | ICD-10-CM | POA: Diagnosis not present

## 2019-07-25 DIAGNOSIS — Z8589 Personal history of malignant neoplasm of other organs and systems: Secondary | ICD-10-CM

## 2019-07-25 DIAGNOSIS — M810 Age-related osteoporosis without current pathological fracture: Secondary | ICD-10-CM | POA: Diagnosis not present

## 2019-07-25 DIAGNOSIS — R030 Elevated blood-pressure reading, without diagnosis of hypertension: Secondary | ICD-10-CM

## 2019-07-25 DIAGNOSIS — Z0001 Encounter for general adult medical examination with abnormal findings: Secondary | ICD-10-CM | POA: Diagnosis not present

## 2019-07-25 DIAGNOSIS — Z79899 Other long term (current) drug therapy: Secondary | ICD-10-CM | POA: Diagnosis not present

## 2019-07-25 DIAGNOSIS — R7309 Other abnormal glucose: Secondary | ICD-10-CM | POA: Diagnosis not present

## 2019-07-25 DIAGNOSIS — J449 Chronic obstructive pulmonary disease, unspecified: Secondary | ICD-10-CM | POA: Diagnosis not present

## 2019-07-25 DIAGNOSIS — Z681 Body mass index (BMI) 19 or less, adult: Secondary | ICD-10-CM

## 2019-07-25 DIAGNOSIS — E039 Hypothyroidism, unspecified: Secondary | ICD-10-CM

## 2019-07-25 DIAGNOSIS — Z0000 Encounter for general adult medical examination without abnormal findings: Secondary | ICD-10-CM

## 2019-07-25 DIAGNOSIS — Z860100 Personal history of colon polyps, unspecified: Secondary | ICD-10-CM | POA: Insufficient documentation

## 2019-07-25 DIAGNOSIS — E559 Vitamin D deficiency, unspecified: Secondary | ICD-10-CM | POA: Diagnosis not present

## 2019-07-25 DIAGNOSIS — E46 Unspecified protein-calorie malnutrition: Secondary | ICD-10-CM | POA: Diagnosis not present

## 2019-07-25 NOTE — Patient Instructions (Addendum)
Katie Morton , Thank you for taking time to come for your Medicare Wellness Visit. I appreciate your ongoing commitment to your health goals. Please review the following plan we discussed and let me know if I can assist you in the future.   These are the goals we discussed: Goals    . Blood Pressure < 130/80    . LDL CALC < 130       This is a list of the screening recommended for you and due dates:  Health Maintenance  Topic Date Due  . Flu Shot  09/23/2019  . Colon Cancer Screening  10/22/2019  . Mammogram  12/07/2020  . Tetanus Vaccine  07/29/2026  . DEXA scan (bone density measurement)  Completed  . COVID-19 Vaccine  Completed  .  Hepatitis C: One time screening is recommended by Center for Disease Control  (CDC) for  adults born from [birth year] through 1963.   Completed  . Pneumonia vaccines  Completed    Dr. Oletta Lamas office number - colonoscopy due August 2021  [phone]   Call insurance about shingrix vaccine coverage - new shingles vaccine - 2 shot series, can get at [facility]    Increase whole grains, beans, nuts/seeds - at least a serving daily - heart healthy and helps with cholesterol   Reduce oils, animal fats    High-Fiber Diet Fiber, also called dietary fiber, is a type of carbohydrate that is found in fruits, vegetables, whole grains, and beans. A high-fiber diet can have many health benefits. Your health care provider may recommend a high-fiber diet to help:  Prevent constipation. Fiber can make your bowel movements more regular.  Lower your cholesterol.  Relieve the following conditions: ? Swelling of veins in the anus (hemorrhoids). ? Swelling and irritation (inflammation) of specific areas of the digestive tract (uncomplicated diverticulosis). ? A problem of the large intestine (colon) that sometimes causes pain and diarrhea (irritable bowel syndrome, IBS).  Prevent overeating as part of a weight-loss plan.  Prevent heart disease, type 2 diabetes, and  certain cancers. What is my plan? The recommended daily fiber intake in grams (g) includes:  38 g for men age 4 or younger.  30 g for men over age 47.  35 g for women age 64 or younger.  21 g for women over age 42. You can get the recommended daily intake of dietary fiber by:  Eating a variety of fruits, vegetables, grains, and beans.  Taking a fiber supplement, if it is not possible to get enough fiber through your diet. What do I need to know about a high-fiber diet?  It is better to get fiber through food sources rather than from fiber supplements. There is not a lot of research about how effective supplements are.  Always check the fiber content on the nutrition facts label of any prepackaged food. Look for foods that contain 5 g of fiber or more per serving.  Talk with a diet and nutrition specialist (dietitian) if you have questions about specific foods that are recommended or not recommended for your medical condition, especially if those foods are not listed below.  Gradually increase how much fiber you consume. If you increase your intake of dietary fiber too quickly, you may have bloating, cramping, or gas.  Drink plenty of water. Water helps you to digest fiber. What are tips for following this plan?  Eat a wide variety of high-fiber foods.  Make sure that half of the grains that you eat each day  are whole grains.  Eat breads and cereals that are made with whole-grain flour instead of refined flour or white flour.  Eat brown rice, bulgur wheat, or millet instead of white rice.  Start the day with a breakfast that is high in fiber, such as a cereal that contains 5 g of fiber or more per serving.  Use beans in place of meat in soups, salads, and pasta dishes.  Eat high-fiber snacks, such as berries, raw vegetables, nuts, and popcorn.  Choose whole fruits and vegetables instead of processed forms like juice or sauce. What foods can I eat?  Fruits Berries.  Pears. Apples. Oranges. Avocado. Prunes and raisins. Dried figs. Vegetables Sweet potatoes. Spinach. Kale. Artichokes. Cabbage. Broccoli. Cauliflower. Green peas. Carrots. Squash. Grains Whole-grain breads. Multigrain cereal. Oats and oatmeal. Brown rice. Barley. Bulgur wheat. [place]. Quinoa. Bran muffins. Popcorn. Rye wafer crackers. Meats and other proteins Navy, kidney, and pinto beans. Soybeans. Split peas. Lentils. Nuts and seeds. Dairy Fiber-fortified yogurt. Beverages Fiber-fortified soy milk. Fiber-fortified orange juice. Other foods Fiber bars. The items listed above may not be a complete list of recommended foods and beverages. Contact a dietitian for more options. What foods are not recommended? Fruits Fruit juice. Cooked, strained fruit. Vegetables Fried potatoes. Canned vegetables. Well-cooked vegetables. Grains White bread. Pasta made with refined flour. White rice. Meats and other proteins Fatty cuts of meat. Fried chicken or fried fish. Dairy Milk. Yogurt. Cream cheese. Sour cream. Fats and oils Butters. Beverages Soft drinks. Other foods Cakes and pastries. The items listed above may not be a complete list of foods and beverages to avoid. Contact a dietitian for more information. Summary  Fiber is a type of carbohydrate. It is found in fruits, vegetables, whole grains, and beans.  There are many health benefits of eating a high-fiber diet, such as preventing constipation, lowering blood cholesterol, helping with weight loss, and reducing your risk of heart disease, diabetes, and certain cancers.  Gradually increase your intake of fiber. Increasing too fast can result in cramping, bloating, and gas. Drink plenty of water while you increase your fiber.  The best sources of fiber include whole fruits and vegetables, whole grains, nuts, seeds, and beans. This information is not intended to replace advice given to you by your health care provider. Make sure you  discuss any questions you have with your health care provider. Document Revised: 12/13/2016 Document Reviewed: 12/13/2016 Elsevier Patient Education  2020 [employer].

## 2019-07-26 DIAGNOSIS — H5213 Myopia, bilateral: Secondary | ICD-10-CM | POA: Diagnosis not present

## 2019-07-26 DIAGNOSIS — H401131 Primary open-angle glaucoma, bilateral, mild stage: Secondary | ICD-10-CM | POA: Diagnosis not present

## 2019-07-26 LAB — COMPLETE METABOLIC PANEL WITH GFR
AG Ratio: 1.5 (calc) (ref 1.0–2.5)
ALT: 16 U/L (ref 6–29)
AST: 17 U/L (ref 10–35)
Albumin: 4.1 g/dL (ref 3.6–5.1)
Alkaline phosphatase (APISO): 64 U/L (ref 37–153)
BUN: 15 mg/dL (ref 7–25)
CO2: 29 mmol/L (ref 20–32)
Calcium: 9.3 mg/dL (ref 8.6–10.4)
Chloride: 104 mmol/L (ref 98–110)
Creat: 0.67 mg/dL (ref 0.60–0.93)
GFR, Est African American: 102 mL/min/{1.73_m2} (ref >=60)
GFR, Est Non African American: 88 mL/min/{1.73_m2} (ref >=60)
Globulin: 2.7 g/dL (calc) (ref 1.9–3.7)
Glucose, Bld: 155 mg/dL — ABNORMAL HIGH (ref 65–99)
Potassium: 4 mmol/L (ref 3.5–5.3)
Sodium: 140 mmol/L (ref 135–146)
Total Bilirubin: 0.4 mg/dL (ref 0.2–1.2)
Total Protein: 6.8 g/dL (ref 6.1–8.1)

## 2019-07-26 LAB — LIPID PANEL
Cholesterol: 200 mg/dL — ABNORMAL HIGH (ref <200)
HDL: 71 mg/dL (ref >=50)
LDL Cholesterol (Calc): 109 mg/dL (calc) — ABNORMAL HIGH
Non-HDL Cholesterol (Calc): 129 mg/dL (calc) (ref <130)
Total CHOL/HDL Ratio: 2.8 (calc) (ref <5.0)
Triglycerides: 106 mg/dL (ref <150)

## 2019-07-26 LAB — CBC WITH DIFFERENTIAL/PLATELET
Absolute Monocytes: 651 cells/uL (ref 200–950)
Basophils Absolute: 91 cells/uL (ref 0–200)
Basophils Relative: 1.3 %
Eosinophils Absolute: 308 cells/uL (ref 15–500)
Eosinophils Relative: 4.4 %
HCT: 36.3 % (ref 35.0–45.0)
Hemoglobin: 11.7 g/dL (ref 11.7–15.5)
Lymphs Abs: 1232 cells/uL (ref 850–3900)
MCH: 28.5 pg (ref 27.0–33.0)
MCHC: 32.2 g/dL (ref 32.0–36.0)
MCV: 88.5 fL (ref 80.0–100.0)
MPV: 12 fL (ref 7.5–12.5)
Monocytes Relative: 9.3 %
Neutro Abs: 4718 cells/uL (ref 1500–7800)
Neutrophils Relative %: 67.4 %
Platelets: 279 10*3/uL (ref 140–400)
RBC: 4.1 10*6/uL (ref 3.80–5.10)
RDW: 12.7 % (ref 11.0–15.0)
Total Lymphocyte: 17.6 %
WBC: 7 10*3/uL (ref 3.8–10.8)

## 2019-07-26 LAB — MAGNESIUM: Magnesium: 2.4 mg/dL (ref 1.5–2.5)

## 2019-07-26 LAB — TSH: TSH: 2.45 mIU/L (ref 0.40–4.50)

## 2019-09-19 DIAGNOSIS — H25813 Combined forms of age-related cataract, bilateral: Secondary | ICD-10-CM | POA: Diagnosis not present

## 2019-09-19 DIAGNOSIS — H401221 Low-tension glaucoma, left eye, mild stage: Secondary | ICD-10-CM | POA: Diagnosis not present

## 2019-09-19 DIAGNOSIS — H401213 Low-tension glaucoma, right eye, severe stage: Secondary | ICD-10-CM | POA: Diagnosis not present

## 2019-09-19 DIAGNOSIS — H35373 Puckering of macula, bilateral: Secondary | ICD-10-CM | POA: Diagnosis not present

## 2019-09-20 DIAGNOSIS — L821 Other seborrheic keratosis: Secondary | ICD-10-CM | POA: Diagnosis not present

## 2019-09-20 DIAGNOSIS — D2371 Other benign neoplasm of skin of right lower limb, including hip: Secondary | ICD-10-CM | POA: Diagnosis not present

## 2019-09-20 DIAGNOSIS — Z808 Family history of malignant neoplasm of other organs or systems: Secondary | ICD-10-CM | POA: Diagnosis not present

## 2019-09-20 DIAGNOSIS — Z85828 Personal history of other malignant neoplasm of skin: Secondary | ICD-10-CM | POA: Diagnosis not present

## 2019-09-20 DIAGNOSIS — D2272 Melanocytic nevi of left lower limb, including hip: Secondary | ICD-10-CM | POA: Diagnosis not present

## 2019-09-20 DIAGNOSIS — D1721 Benign lipomatous neoplasm of skin and subcutaneous tissue of right arm: Secondary | ICD-10-CM | POA: Diagnosis not present

## 2019-09-20 DIAGNOSIS — D2372 Other benign neoplasm of skin of left lower limb, including hip: Secondary | ICD-10-CM | POA: Diagnosis not present

## 2019-09-20 DIAGNOSIS — L570 Actinic keratosis: Secondary | ICD-10-CM | POA: Diagnosis not present

## 2019-09-20 DIAGNOSIS — D2262 Melanocytic nevi of left upper limb, including shoulder: Secondary | ICD-10-CM | POA: Diagnosis not present

## 2019-10-22 ENCOUNTER — Encounter: Payer: Self-pay | Admitting: Internal Medicine

## 2019-10-22 NOTE — Patient Instructions (Signed)

## 2019-10-22 NOTE — Progress Notes (Signed)
History of Present Illness:       This very nice 74 y.o.  MWFG presents for 6 month follow up with labile HTN , HLD, Pre-Diabetes and Vitamin D Deficiency.       Patient is followed expectantly for labile HTN  (2012)  & BP has been controlled at home. Today's BP is at goal -  120/72. Patient has had no complaints of any cardiac type chest pain, palpitations, dyspnea / orthopnea / PND, dizziness, claudication, or dependent edema.      Hyperlipidemia is not  controlled with diet & supplements. Last Lipids were not at goal:  Lab Results  Component Value Date   CHOL 200 (H) 07/25/2019   HDL 71 07/25/2019   LDLCALC 109 (H) 07/25/2019   TRIG 106 07/25/2019   CHOLHDL 2.8 07/25/2019    Also, the patient has history of PreDiabetes  (A1c 5.7% / 2012 & 6.0% / 2016) and has had no symptoms of reactive hypoglycemia, diabetic polys, paresthesias or visual blurring.  Last A1c was  Normal & at goal:  Lab Results  Component Value Date   HGBA1C 5.6 04/16/2019        Patient has been on Thyroid Replacement since 2015.          Further, the patient also has history of Vitamin D Deficiency and supplements vitamin D without any suspected side-effects. Last vitamin D was at goal:  Lab Results  Component Value Date   VD25OH 24 04/16/2019    Current Outpatient Medications on File Prior to Visit  Medication Sig  . alendronate (FOSAMAX) 70 MG tablet Take 1 tablet weekly (every 7 days) on an empty stomach with a full glass of water. Do not lie down after taking this medication.  . Ascorbic Acid (VITAMIN C PO) Take 1 tablet by mouth daily.  Marland Kitchen aspirin EC 81 MG tablet Take 81 mg by mouth daily.  Marland Kitchen BIOTIN PO Take 1 capsule by mouth daily.  . Calcium Citrate-Vitamin D (CALCIUM CITRATE + D PO) Take by mouth daily.  . Cholecalciferol (VITAMIN D PO) Take 2,000 tablets by mouth daily.   Marland Kitchen CINNAMON PO Take 1,000 capsules by mouth 2 (two) times daily.   . dorzolamide (TRUSOPT) 2 % ophthalmic solution  INSTILL 1 DROP INTO BOTH EYES TWICE A DAY  . fish oil-omega-3 fatty acids 1000 MG capsule Take 1 g by mouth daily.   . Flaxseed, Linseed, (FLAXSEED OIL) 1000 MG CAPS Take 1,000 capsules by mouth daily.  Marland Kitchen latanoprost (XALATAN) 0.005 % ophthalmic solution INSTILL 1 DROP INTO BOTH EYES EVERY DAY AT NIGHT  . levothyroxine (SYNTHROID) 50 MCG tablet TAKE 1/2 TABLET DAILY ON AN EMPTY STOMACH FOR 30 MIN  . Misc Natural Products (OSTEO BI-FLEX JOINT SHIELD PO) Take 1 tablet by mouth 2 (two) times daily.  . Multiple Vitamins-Minerals (MULTIVITAMIN WITH MINERALS) tablet Take 1 tablet by mouth daily.  . Multiple Vitamins-Minerals (PRESERVISION AREDS PO) Take 1 tablet by mouth daily.  Marland Kitchen OVER THE COUNTER MEDICATION 2 capsules daily. Tumeric w/ Black pepper extract  . zinc gluconate 50 MG tablet Take 50 mg by mouth daily.   No current facility-administered medications on file prior to visit.    Allergies  Allergen Reactions  . Silver Sulfadiazine     PMHx:   Past Medical History:  Diagnosis Date  . Arthritis   . Cancer ([place]) 2016   squamous cell skin cancer   . COPD (chronic obstructive pulmonary disease) ([place])   .  Elevated hemoglobin A1c   . Hyperlipidemia   . Labile hypertension   . Osteoporosis   . Villous adenoma 2008    Immunization History  Administered Date(s) Administered  . Influenza, High Dose Seasonal PF 01/02/2013, 01/07/2014, 01/23/2015, 12/01/2015, 01/19/2017, 12/29/2017, 12/20/2018  . Influenza-Unspecified 12/16/2011, 12/23/2016, 01/23/2017  . PFIZER SARS-COV-2 Vaccination 04/02/2019, 04/27/2019  . Pneumococcal Conjugate-13 01/07/2014  . Pneumococcal Polysaccharide-23 08/07/2015  . Pneumococcal-Unspecified 02/23/1995  . Td 02/22/2005, 07/28/2016  . Zoster 12/16/2011    Past Surgical History:  Procedure Laterality Date  . HIP PINNING,CANNULATED Left 09/19/2012   Procedure: Percutaneous Screw Fixation Left Hip Fracture;  Surgeon: Nita Sells, MD;  Location:  [facility];  Service: Orthopedics;  Laterality: Left;  Marland Kitchen MANDIBLE SURGERY    . TONSILLECTOMY      FHx:    Reviewed / unchanged  SHx:    Reviewed / unchanged   Systems Review:  Constitutional: Denies fever, chills, wt changes, headaches, insomnia, fatigue, night sweats, change in appetite. Eyes: Denies redness, blurred vision, diplopia, discharge, itchy, watery eyes.  ENT: Denies discharge, congestion, post nasal drip, epistaxis, sore throat, earache, hearing loss, dental pain, tinnitus, vertigo, sinus pain, snoring.  CV: Denies chest pain, palpitations, irregular heartbeat, syncope, dyspnea, diaphoresis, orthopnea, PND, claudication or edema. Respiratory: denies cough, dyspnea, DOE, pleurisy, hoarseness, laryngitis, wheezing.  Gastrointestinal: Denies dysphagia, odynophagia, heartburn, reflux, water brash, abdominal pain or cramps, nausea, vomiting, bloating, diarrhea, constipation, hematemesis, melena, hematochezia  or hemorrhoids. Genitourinary: Denies dysuria, frequency, urgency, nocturia, hesitancy, discharge, hematuria or flank pain. Musculoskeletal: Denies arthralgias, myalgias, stiffness, jt. swelling, pain, limping or strain/sprain.  Skin: Denies pruritus, rash, hives, warts, acne, eczema or change in skin lesion(s). Neuro: No weakness, tremor, incoordination, spasms, paresthesia or pain. Psychiatric: Denies confusion, memory loss or sensory loss. Endo: Denies change in weight, skin or hair change.  Heme/Lymph: No excessive bleeding, bruising or enlarged lymph nodes.  Physical Exam  BP 120/72   Pulse (!) 58   Temp 97.6 F (36.4 C)   Wt 116 lb (52.6 kg)   SpO2 99%   BMI 17.90 kg/m   Appears  well nourished, well groomed  and in no distress.  Eyes: PERRLA, EOMs, conjunctiva no swelling or erythema. Sinuses: No frontal/maxillary tenderness ENT/Mouth: EAC's clear, TM's nl w/o erythema, bulging. Nares clear w/o erythema, swelling, exudates. Oropharynx clear without erythema or  exudates. Oral hygiene is good. Tongue normal, non obstructing. Hearing intact.  Neck: Supple. Thyroid not palpable. Car 2+/2+ without bruits, nodes or JVD. Chest: Respirations nl with BS clear & equal w/o rales, rhonchi, wheezing or stridor.  Cor: Heart sounds normal w/ regular rate and rhythm without sig. murmurs, gallops, clicks or rubs. Peripheral pulses normal and equal  without edema.  Abdomen: Soft & bowel sounds normal. Non-tender w/o guarding, rebound, hernias, masses or organomegaly.  Lymphatics: Unremarkable.  Musculoskeletal: Full ROM all peripheral extremities, joint stability, 5/5 strength and normal gait.  Skin: Warm, dry without exposed rashes, lesions or ecchymosis apparent.  Neuro: Cranial nerves intact, reflexes equal bilaterally. Sensory-motor testing grossly intact. Tendon reflexes grossly intact.  Pysch: Alert & oriented x 3.  Insight and judgement nl & appropriate. No ideations.  Assessment and Plan:  1. Labile hypertension  - Continue medication, monitor blood pressure at home.  - Continue DASH diet.  Reminder to go to the ER if any CP,  SOB, nausea, dizziness, severe HA, changes vision/speech.  - CBC with Differential/Platelet - COMPLETE METABOLIC PANEL WITH GFR - Magnesium - TSH  2. Hyperlipidemia,  mixed  - Continue diet/meds, exercise,& lifestyle modifications.  - Continue monitor periodic cholesterol/liver & renal functions   - Lipid panel - TSH  3. Abnormal glucose  - Continue diet, exercise  - Lifestyle modifications.  - Monitor appropriate labs.  - Hemoglobin A1c - Insulin, random  4. Vitamin D deficiency  - Continue supplementation.  - VITAMIN D 25 Hydroxyl  5. Hypothyroidism  - TSH  6. Medication management  - CBC with Differential/Platelet - COMPLETE METABOLIC PANEL WITH GFR - Magnesium - Lipid panel - TSH - Hemoglobin A1c - Insulin, random - VITAMIN D 25 Hydroxy         Discussed  regular exercise, BP monitoring, weight  control to achieve/maintain BMI less than 25 and discussed med and SE's. Recommended labs to assess and monitor clinical status with further disposition pending results of labs.  I discussed the assessment and treatment plan with the patient. The patient was provided an opportunity to ask questions and all were answered. The patient agreed with the plan and demonstrated an understanding of the instructions.  I provided over 30 minutes of exam, counseling, chart review and  complex critical decision making.   Kirtland Bouchard, MD

## 2019-10-23 ENCOUNTER — Encounter: Payer: Self-pay | Admitting: Internal Medicine

## 2019-10-23 ENCOUNTER — Other Ambulatory Visit: Payer: Self-pay

## 2019-10-23 ENCOUNTER — Ambulatory Visit: Payer: Medicare PPO | Admitting: Internal Medicine

## 2019-10-23 VITALS — BP 120/72 | HR 58 | Temp 97.6°F | Resp 12 | Ht 67.5 in | Wt 116.0 lb

## 2019-10-23 DIAGNOSIS — E039 Hypothyroidism, unspecified: Secondary | ICD-10-CM | POA: Diagnosis not present

## 2019-10-23 DIAGNOSIS — E782 Mixed hyperlipidemia: Secondary | ICD-10-CM | POA: Diagnosis not present

## 2019-10-23 DIAGNOSIS — Z79899 Other long term (current) drug therapy: Secondary | ICD-10-CM

## 2019-10-23 DIAGNOSIS — E559 Vitamin D deficiency, unspecified: Secondary | ICD-10-CM

## 2019-10-23 DIAGNOSIS — R0989 Other specified symptoms and signs involving the circulatory and respiratory systems: Secondary | ICD-10-CM | POA: Diagnosis not present

## 2019-10-23 DIAGNOSIS — R7309 Other abnormal glucose: Secondary | ICD-10-CM | POA: Diagnosis not present

## 2019-10-24 LAB — MAGNESIUM: Magnesium: 2.4 mg/dL (ref 1.5–2.5)

## 2019-10-24 LAB — CBC WITH DIFFERENTIAL/PLATELET
Absolute Monocytes: 800 cells/uL (ref 200–950)
Basophils Absolute: 96 cells/uL (ref 0–200)
Basophils Relative: 1.1 %
Eosinophils Absolute: 479 cells/uL (ref 15–500)
Eosinophils Relative: 5.5 %
HCT: 38.9 % (ref 35.0–45.0)
Hemoglobin: 12.7 g/dL (ref 11.7–15.5)
Lymphs Abs: 1862 cells/uL (ref 850–3900)
MCH: 28.5 pg (ref 27.0–33.0)
MCHC: 32.6 g/dL (ref 32.0–36.0)
MCV: 87.4 fL (ref 80.0–100.0)
MPV: 12.4 fL (ref 7.5–12.5)
Monocytes Relative: 9.2 %
Neutro Abs: 5464 cells/uL (ref 1500–7800)
Neutrophils Relative %: 62.8 %
Platelets: 244 10*3/uL (ref 140–400)
RBC: 4.45 10*6/uL (ref 3.80–5.10)
RDW: 13.6 % (ref 11.0–15.0)
Total Lymphocyte: 21.4 %
WBC: 8.7 10*3/uL (ref 3.8–10.8)

## 2019-10-24 LAB — LIPID PANEL
Cholesterol: 207 mg/dL — ABNORMAL HIGH (ref <200)
HDL: 72 mg/dL (ref >=50)
LDL Cholesterol (Calc): 108 mg/dL (calc) — ABNORMAL HIGH
Non-HDL Cholesterol (Calc): 135 mg/dL (calc) — ABNORMAL HIGH (ref <130)
Total CHOL/HDL Ratio: 2.9 (calc) (ref <5.0)
Triglycerides: 153 mg/dL — ABNORMAL HIGH (ref <150)

## 2019-10-24 LAB — COMPLETE METABOLIC PANEL WITH GFR
AG Ratio: 1.5 (calc) (ref 1.0–2.5)
ALT: 18 U/L (ref 6–29)
AST: 20 U/L (ref 10–35)
Albumin: 4.2 g/dL (ref 3.6–5.1)
Alkaline phosphatase (APISO): 71 U/L (ref 37–153)
BUN: 15 mg/dL (ref 7–25)
CO2: 28 mmol/L (ref 20–32)
Calcium: 9.4 mg/dL (ref 8.6–10.4)
Chloride: 102 mmol/L (ref 98–110)
Creat: 0.62 mg/dL (ref 0.60–0.93)
GFR, Est African American: 104 mL/min/{1.73_m2} (ref >=60)
GFR, Est Non African American: 90 mL/min/{1.73_m2} (ref >=60)
Globulin: 2.8 g/dL (calc) (ref 1.9–3.7)
Glucose, Bld: 78 mg/dL (ref 65–99)
Potassium: 3.9 mmol/L (ref 3.5–5.3)
Sodium: 137 mmol/L (ref 135–146)
Total Bilirubin: 0.5 mg/dL (ref 0.2–1.2)
Total Protein: 7 g/dL (ref 6.1–8.1)

## 2019-10-24 LAB — INSULIN, RANDOM: Insulin: 3.8 u[IU]/mL

## 2019-10-24 LAB — VITAMIN D 25 HYDROXY (VIT D DEFICIENCY, FRACTURES): Vit D, 25-Hydroxy: 88 ng/mL (ref 30–100)

## 2019-10-24 LAB — HEMOGLOBIN A1C
Hgb A1c MFr Bld: 5.7 % of total Hgb — ABNORMAL HIGH (ref <5.7)
Mean Plasma Glucose: 117 (calc)
eAG (mmol/L): 6.5 (calc)

## 2019-10-24 LAB — TSH: TSH: 2.07 mIU/L (ref 0.40–4.50)

## 2019-10-24 NOTE — Progress Notes (Signed)
========================================================== -   Test results slightly outside the reference range are not unusual. If there is anything important, I will review this with you,  otherwise it is considered normal test values.  If you have further questions,  please do not hesitate to contact me at the office or via My Chart.  ==========================================================  -  Total Chol = 207 , slightly elevated - is OK since have a very high Good HDL Chol level of 72   - But LDL Chol = 108 is a little high  (Ideal or goal is less than 70)  - So...........  - Recommend a stricter low cholesterol diet   - Cholesterol only comes from animal sources  - ie. meat, dairy, egg yolks  - Eat all the vegetables you want.  - Avoid meat, especially red meat - Beef AND Pork .  - Avoid cheese & dairy - milk & ice cream.     - Cheese is the most concentrated form of trans-fats which  is the worst thing to clog up our arteries.   - Veggie cheese is OK which can be found in the fresh  produce section at [employer] or [employer] or [employer] ==========================================================  -  A1c = 5.7 - still "hovering" on the borderline of slightly elevated, So.............  - Avoid Sweets, Candy & White Stuff   - Rice, Potatoes, Breads &  Pasta ==========================================================  -  Vitamin D = 88 - Excellent  ! ==========================================================  -  All Else - CBC - Kidneys - Electrolytes - Liver - Magnesium & Thyroid    - all  Normal / OK ==========================================================

## 2019-11-03 ENCOUNTER — Other Ambulatory Visit: Payer: Self-pay | Admitting: Adult Health

## 2019-11-03 DIAGNOSIS — M810 Age-related osteoporosis without current pathological fracture: Secondary | ICD-10-CM

## 2019-11-23 DIAGNOSIS — Z1159 Encounter for screening for other viral diseases: Secondary | ICD-10-CM | POA: Diagnosis not present

## 2019-11-27 DIAGNOSIS — Z86010 Personal history of colonic polyps: Secondary | ICD-10-CM | POA: Diagnosis not present

## 2019-11-27 DIAGNOSIS — D123 Benign neoplasm of transverse colon: Secondary | ICD-10-CM | POA: Diagnosis not present

## 2019-11-27 DIAGNOSIS — D122 Benign neoplasm of ascending colon: Secondary | ICD-10-CM | POA: Diagnosis not present

## 2019-11-27 DIAGNOSIS — D120 Benign neoplasm of cecum: Secondary | ICD-10-CM | POA: Diagnosis not present

## 2019-11-28 DIAGNOSIS — H401213 Low-tension glaucoma, right eye, severe stage: Secondary | ICD-10-CM | POA: Diagnosis not present

## 2019-11-28 DIAGNOSIS — H401221 Low-tension glaucoma, left eye, mild stage: Secondary | ICD-10-CM | POA: Diagnosis not present

## 2019-11-28 DIAGNOSIS — H25813 Combined forms of age-related cataract, bilateral: Secondary | ICD-10-CM | POA: Diagnosis not present

## 2019-11-30 DIAGNOSIS — D120 Benign neoplasm of cecum: Secondary | ICD-10-CM | POA: Diagnosis not present

## 2019-11-30 DIAGNOSIS — D123 Benign neoplasm of transverse colon: Secondary | ICD-10-CM | POA: Diagnosis not present

## 2019-11-30 DIAGNOSIS — D122 Benign neoplasm of ascending colon: Secondary | ICD-10-CM | POA: Diagnosis not present

## 2019-12-19 ENCOUNTER — Other Ambulatory Visit: Payer: Self-pay | Admitting: Adult Health

## 2019-12-19 DIAGNOSIS — Z1231 Encounter for screening mammogram for malignant neoplasm of breast: Secondary | ICD-10-CM

## 2019-12-22 ENCOUNTER — Ambulatory Visit: Payer: Medicare PPO

## 2020-01-02 ENCOUNTER — Ambulatory Visit (INDEPENDENT_AMBULATORY_CARE_PROVIDER_SITE_OTHER): Payer: Medicare PPO | Admitting: *Deleted

## 2020-01-02 ENCOUNTER — Other Ambulatory Visit: Payer: Self-pay

## 2020-01-02 VITALS — Temp 97.0°F

## 2020-01-02 DIAGNOSIS — Z23 Encounter for immunization: Secondary | ICD-10-CM

## 2020-01-15 DIAGNOSIS — H401213 Low-tension glaucoma, right eye, severe stage: Secondary | ICD-10-CM | POA: Diagnosis not present

## 2020-01-15 DIAGNOSIS — H401221 Low-tension glaucoma, left eye, mild stage: Secondary | ICD-10-CM | POA: Diagnosis not present

## 2020-01-15 DIAGNOSIS — H10011 Acute follicular conjunctivitis, right eye: Secondary | ICD-10-CM | POA: Diagnosis not present

## 2020-01-15 DIAGNOSIS — H25813 Combined forms of age-related cataract, bilateral: Secondary | ICD-10-CM | POA: Diagnosis not present

## 2020-01-23 ENCOUNTER — Ambulatory Visit: Payer: Medicare PPO | Admitting: Adult Health

## 2020-02-04 ENCOUNTER — Ambulatory Visit: Payer: Medicare PPO | Admitting: Adult Health

## 2020-02-04 ENCOUNTER — Ambulatory Visit: Payer: Medicare PPO

## 2020-02-05 DIAGNOSIS — D126 Benign neoplasm of colon, unspecified: Secondary | ICD-10-CM | POA: Diagnosis not present

## 2020-02-06 DIAGNOSIS — Z79899 Other long term (current) drug therapy: Secondary | ICD-10-CM | POA: Diagnosis not present

## 2020-02-06 DIAGNOSIS — H409 Unspecified glaucoma: Secondary | ICD-10-CM | POA: Diagnosis not present

## 2020-02-06 DIAGNOSIS — J449 Chronic obstructive pulmonary disease, unspecified: Secondary | ICD-10-CM | POA: Diagnosis not present

## 2020-02-06 DIAGNOSIS — K635 Polyp of colon: Secondary | ICD-10-CM | POA: Diagnosis not present

## 2020-02-06 DIAGNOSIS — Z79890 Hormone replacement therapy (postmenopausal): Secondary | ICD-10-CM | POA: Diagnosis not present

## 2020-02-06 DIAGNOSIS — D120 Benign neoplasm of cecum: Secondary | ICD-10-CM | POA: Diagnosis not present

## 2020-02-06 DIAGNOSIS — E039 Hypothyroidism, unspecified: Secondary | ICD-10-CM | POA: Diagnosis not present

## 2020-02-06 DIAGNOSIS — D123 Benign neoplasm of transverse colon: Secondary | ICD-10-CM | POA: Diagnosis not present

## 2020-02-06 DIAGNOSIS — D122 Benign neoplasm of ascending colon: Secondary | ICD-10-CM | POA: Diagnosis not present

## 2020-02-08 LAB — HM COLONOSCOPY

## 2020-02-11 ENCOUNTER — Ambulatory Visit
Admission: RE | Admit: 2020-02-11 | Discharge: 2020-02-11 | Disposition: A | Discharge status: Home / Self Care | Payer: Medicare PPO | Source: Ambulatory Visit | Attending: Adult Health | Admitting: Adult Health

## 2020-02-11 ENCOUNTER — Other Ambulatory Visit: Payer: Self-pay

## 2020-02-11 DIAGNOSIS — Z1231 Encounter for screening mammogram for malignant neoplasm of breast: Secondary | ICD-10-CM | POA: Diagnosis not present

## 2020-02-11 DIAGNOSIS — H401221 Low-tension glaucoma, left eye, mild stage: Secondary | ICD-10-CM | POA: Diagnosis not present

## 2020-02-11 DIAGNOSIS — H401213 Low-tension glaucoma, right eye, severe stage: Secondary | ICD-10-CM | POA: Diagnosis not present

## 2020-02-13 ENCOUNTER — Ambulatory Visit: Payer: Medicare PPO | Admitting: Adult Health

## 2020-02-13 ENCOUNTER — Encounter: Payer: Self-pay | Admitting: Adult Health

## 2020-02-13 ENCOUNTER — Other Ambulatory Visit: Payer: Self-pay

## 2020-02-13 VITALS — BP 120/64 | HR 64 | Temp 97.5°F | Wt 117.0 lb

## 2020-02-13 DIAGNOSIS — Z86010 Personal history of colonic polyps: Secondary | ICD-10-CM | POA: Diagnosis not present

## 2020-02-13 DIAGNOSIS — E46 Unspecified protein-calorie malnutrition: Secondary | ICD-10-CM

## 2020-02-13 DIAGNOSIS — E782 Mixed hyperlipidemia: Secondary | ICD-10-CM | POA: Diagnosis not present

## 2020-02-13 DIAGNOSIS — Z681 Body mass index (BMI) 19 or less, adult: Secondary | ICD-10-CM

## 2020-02-13 DIAGNOSIS — R030 Elevated blood-pressure reading, without diagnosis of hypertension: Secondary | ICD-10-CM | POA: Diagnosis not present

## 2020-02-13 DIAGNOSIS — R7309 Other abnormal glucose: Secondary | ICD-10-CM | POA: Diagnosis not present

## 2020-02-13 DIAGNOSIS — Z79899 Other long term (current) drug therapy: Secondary | ICD-10-CM

## 2020-02-13 DIAGNOSIS — E039 Hypothyroidism, unspecified: Secondary | ICD-10-CM | POA: Diagnosis not present

## 2020-02-13 DIAGNOSIS — E559 Vitamin D deficiency, unspecified: Secondary | ICD-10-CM | POA: Diagnosis not present

## 2020-02-13 NOTE — Progress Notes (Signed)
3 MONTH FOLLOW UP  Assessment:     Hypothyroidism, unspecified hypothyroidism type -cont levothyroxine - TSH   Labile hypertension -cont monitoring -currently managed by diet and exericse - goal <130/80 - TSH   Hyperlipidemia -diet and exercise , high fiber discussed -check lipid panel - low risk, start statin if LDL trending 130+  Medication management - CBC with Differential/Platelet - CMP/GFR  Chronic obstructive pulmonary disease, unspecified COPD type ([place]) -former smoker, no active sx, cont to monitor -no current treatment needed  Osteoporosis -back on fosamax 11/2018  -Bone Density scan in 2 years   Elevated hemoglobin A1c -check a1c at CPE -diet and exercise as tolerated   Vitamin D deficiency At goal at recent check; continue to recommend supplementation for goal of 70-100 Defer vitamin D level  Personal history of colon polyps Due 1 year follow up 01/2021 with [facility]   BMI less than 19,adult -exercise as tolerated -cont increased calorie load   Over 40 minutes of exam, counseling, chart review and critical decision making was performed Future Appointments  Date Time Provider [place]  05/01/2020  2:00 PM Unk Pinto, MD [facility] None  07/24/2020  3:00 PM Liane Comber, NP [facility] None     Subjective:  Katie Morton is a 74 y.o. female who presents for and 3 month follow up.   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   Follows annually with Dr. Jari Pigg for history of SCC.   Recently underwent colonoscopy 02/06/2020 by Dr. Stephanie Acre at [facility] due to 20+ polyps, all beninign but 1 year recall due to number.   She is following with Dr. Ander Slade at [facility] for glaucoma, discussing possible surgery next year.   BMI is Body mass index is 18.05 kg/m., she has been working on diet and exercise. Wt Readings from Last 3 Encounters:  02/13/20 117 lb (53.1 kg)  10/23/19 116 lb (52.6 kg)  07/25/19  118 lb (53.5 kg)    Her blood pressure has been controlled at home (130s/70s), today their BP is BP: 120/64 She does workout. She denies chest pain, shortness of breath, dizziness.   She is not on cholesterol medication (taking omega 3 supplement, doing soluble fiber supplement, was prescribed rosuvastatin and zetia but never started per personal preference, would consider if LDL trending 130+) and denies myalgias. Her cholesterol is not at goal. The cholesterol last visit was:   Lab Results  Component Value Date   CHOL 207 (H) 10/23/2019   HDL 72 10/23/2019   LDLCALC 108 (H) 10/23/2019   TRIG 153 (H) 10/23/2019   CHOLHDL 2.9 10/23/2019    She has been working on diet and exercise for hx of prediabetes recently well controlled, and denies increased appetite, nausea, paresthesia of the feet, polydipsia, polyuria and visual disturbances. Last A1C in the office was:  Lab Results  Component Value Date   HGBA1C 5.7 (H) 10/23/2019   She is on thyroid medication. Her medication was not changed last visit.  Taking 25 mcg daily.  Lab Results  Component Value Date   TSH 2.07 10/23/2019   Last GFR: Lab Results  Component Value Date   GFRNONAA 90 10/23/2019   Patient is on Vitamin D supplement.   Lab Results  Component Value Date   VD25OH 88 10/23/2019      Medication Review: Current Outpatient Medications on File Prior to Visit  Medication Sig Dispense Refill  . alendronate (FOSAMAX) 70 MG tablet TAKE 1 TABLET ONCE  WEEKLY ON AN EMPTY STOMACH WITH A FULL GLASS OF WATER. DONT LIE DOWN AFTER TAKING 12 tablet 3  . Ascorbic Acid (VITAMIN C PO) Take 1 tablet by mouth daily.    Marland Kitchen aspirin EC 81 MG tablet Take 81 mg by mouth daily.    Marland Kitchen BIOTIN PO Take 1 capsule by mouth daily.    . Calcium Citrate-Vitamin D (CALCIUM CITRATE + D PO) Take by mouth daily.    . Cholecalciferol (VITAMIN D PO) Take 2,000 tablets by mouth daily.     Marland Kitchen CINNAMON PO Take 1,000 capsules by mouth 2 (two) times daily.      . dorzolamide (TRUSOPT) 2 % ophthalmic solution INSTILL 1 DROP INTO BOTH EYES TWICE A DAY  11  . fish oil-omega-3 fatty acids 1000 MG capsule Take 1 g by mouth daily.     . Flaxseed, Linseed, (FLAXSEED OIL) 1000 MG CAPS Take 1,000 capsules by mouth daily.    Marland Kitchen latanoprost (XALATAN) 0.005 % ophthalmic solution INSTILL 1 DROP INTO BOTH EYES EVERY DAY AT NIGHT  11  . levothyroxine (SYNTHROID) 50 MCG tablet TAKE 1/2 TABLET DAILY ON AN EMPTY STOMACH FOR 30 MIN 90 tablet 1  . Misc Natural Products (OSTEO BI-FLEX JOINT SHIELD PO) Take 1 tablet by mouth 2 (two) times daily.    . Multiple Vitamins-Minerals (MULTIVITAMIN WITH MINERALS) tablet Take 1 tablet by mouth daily.    . Multiple Vitamins-Minerals (PRESERVISION AREDS PO) Take 1 tablet by mouth daily.    Marland Kitchen OVER THE COUNTER MEDICATION 2 capsules daily. Tumeric w/ Black pepper extract (Patient not taking: Reported on 02/13/2020)    . zinc gluconate 50 MG tablet Take 50 mg by mouth daily. (Patient not taking: Reported on 02/13/2020)     No current facility-administered medications on file prior to visit.    Allergies  Allergen Reactions  . Brimonidine Itching  . Silver Sulfadiazine     Current Problems (verified) Patient Active Problem List   Diagnosis Date Noted  . Personal history of colonic polyps 07/25/2019  . History of squamous cell carcinoma 07/25/2019  . Protein-calorie malnutrition ([place]) 02/28/2018  . BMI less than 19,adult 01/23/2015  . Encounter for Medicare annual wellness exam 01/23/2015  . Hypothyroidism 06/11/2014  . Medication management 01/01/2013  . Vitamin D deficiency 01/01/2013  . Hyperlipidemia   . Blood pressure elevated without history of HTN   . Abnormal glucose   . Osteoporosis 09/20/2012  . COPD (chronic obstructive pulmonary disease) ([place])      SURGICAL HISTORY She  has a past surgical history that includes Tonsillectomy; Mandible surgery; and Hip pinning, cannulated (Left, 09/19/2012). FAMILY  HISTORY Her family history includes CAD in her maternal grandfather and paternal grandfather; COPD in her father; Cancer in her maternal aunt and paternal aunt; Cancer - Other in her father; Diabetes in her mother; Heart disease in her brother and mother; Hypertension in her mother; Mitral valve prolapse in her mother; Stroke in her father. SOCIAL HISTORY She  reports that she quit smoking about 42 years ago. She has never used smokeless tobacco. She reports current alcohol use of about 14.0 standard drinks of alcohol per week. She reports that she does not use drugs.   EOL planning:    Review of Systems  Constitutional: Negative for malaise/fatigue and weight loss.  HENT: Negative for hearing loss and tinnitus.   Eyes: Negative for blurred vision and double vision.  Respiratory: Negative for cough, shortness of breath and wheezing.   Cardiovascular: Negative for  chest pain, palpitations, orthopnea, claudication and leg swelling.  Gastrointestinal: Negative for abdominal pain, blood in stool, constipation, diarrhea, heartburn, melena, nausea and vomiting.  Genitourinary: Negative.   Musculoskeletal: Negative for falls, joint pain (right hand pain intermittently, weak grip) and myalgias.  Skin: Negative for rash.  Neurological: Negative for dizziness, tingling, sensory change, weakness and headaches.  Endo/Heme/Allergies: Negative for polydipsia.  Psychiatric/Behavioral: Negative.   All other systems reviewed and are negative.    Objective:     Today's Vitals   02/13/20 1541  BP: 120/64  Pulse: 64  Temp: (!) 97.5 F (36.4 C)  SpO2: 99%  Weight: 117 lb (53.1 kg)   Body mass index is 18.05 kg/m.  General appearance: alert, no distress, WD/WN, female HEENT: normocephalic, sclerae anicteric, TMs pearly, nares patent, no discharge or erythema, pharynx normal Oral cavity: MMM, no lesions Neck: supple, no lymphadenopathy, no thyromegaly, no masses Heart: RRR, normal S1, S2, no  murmurs Lungs: CTA bilaterally, no wheezes, rhonchi, or rales Abdomen: +bs, soft, non tender, non distended, no masses, no hepatomegaly, no splenomegaly Musculoskeletal: nontender, no swelling; she has reduced ROM of R 1st digit base with mild contracture  Extremities: no edema, no cyanosis, no clubbing Pulses: 2+ symmetric, upper and lower extremities, normal cap refill Neurological: alert, oriented x 3, CN2-12 intact, strength normal upper extremities and lower extremities, sensation normal throughout, DTRs 2+ throughout, no cerebellar signs, gait normal Psychiatric: normal affect, behavior normal, pleasant    Izora Ribas, NP   02/13/2020

## 2020-02-14 LAB — CBC WITH DIFFERENTIAL/PLATELET
Absolute Monocytes: 694 cells/uL (ref 200–950)
Basophils Absolute: 70 cells/uL (ref 0–200)
Basophils Relative: 0.9 %
Eosinophils Absolute: 374 cells/uL (ref 15–500)
Eosinophils Relative: 4.8 %
HCT: 38.2 % (ref 35.0–45.0)
Hemoglobin: 12.3 g/dL (ref 11.7–15.5)
Lymphs Abs: 1739 cells/uL (ref 850–3900)
MCH: 28.5 pg (ref 27.0–33.0)
MCHC: 32.2 g/dL (ref 32.0–36.0)
MCV: 88.6 fL (ref 80.0–100.0)
MPV: 12 fL (ref 7.5–12.5)
Monocytes Relative: 8.9 %
Neutro Abs: 4922 cells/uL (ref 1500–7800)
Neutrophils Relative %: 63.1 %
Platelets: 312 10*3/uL (ref 140–400)
RBC: 4.31 10*6/uL (ref 3.80–5.10)
RDW: 11.7 % (ref 11.0–15.0)
Total Lymphocyte: 22.3 %
WBC: 7.8 10*3/uL (ref 3.8–10.8)

## 2020-02-14 LAB — COMPLETE METABOLIC PANEL WITH GFR
AG Ratio: 1.5 (calc) (ref 1.0–2.5)
ALT: 17 U/L (ref 6–29)
AST: 18 U/L (ref 10–35)
Albumin: 4.1 g/dL (ref 3.6–5.1)
Alkaline phosphatase (APISO): 72 U/L (ref 37–153)
BUN: 14 mg/dL (ref 7–25)
CO2: 29 mmol/L (ref 20–32)
Calcium: 9.5 mg/dL (ref 8.6–10.4)
Chloride: 102 mmol/L (ref 98–110)
Creat: 0.64 mg/dL (ref 0.60–0.93)
GFR, Est African American: 103 mL/min/{1.73_m2} (ref >=60)
GFR, Est Non African American: 89 mL/min/{1.73_m2} (ref >=60)
Globulin: 2.8 g/dL (calc) (ref 1.9–3.7)
Glucose, Bld: 96 mg/dL (ref 65–99)
Potassium: 4.2 mmol/L (ref 3.5–5.3)
Sodium: 140 mmol/L (ref 135–146)
Total Bilirubin: 0.3 mg/dL (ref 0.2–1.2)
Total Protein: 6.9 g/dL (ref 6.1–8.1)

## 2020-02-14 LAB — LIPID PANEL
Cholesterol: 216 mg/dL — ABNORMAL HIGH (ref <200)
HDL: 60 mg/dL (ref >=50)
LDL Cholesterol (Calc): 128 mg/dL (calc) — ABNORMAL HIGH
Non-HDL Cholesterol (Calc): 156 mg/dL (calc) — ABNORMAL HIGH (ref <130)
Total CHOL/HDL Ratio: 3.6 (calc) (ref <5.0)
Triglycerides: 164 mg/dL — ABNORMAL HIGH (ref <150)

## 2020-02-14 LAB — TSH: TSH: 3.91 mIU/L (ref 0.40–4.50)

## 2020-02-14 LAB — MAGNESIUM: Magnesium: 2.4 mg/dL (ref 1.5–2.5)

## 2020-02-25 DIAGNOSIS — H25813 Combined forms of age-related cataract, bilateral: Secondary | ICD-10-CM | POA: Diagnosis not present

## 2020-02-25 DIAGNOSIS — H401221 Low-tension glaucoma, left eye, mild stage: Secondary | ICD-10-CM | POA: Diagnosis not present

## 2020-02-25 DIAGNOSIS — H401213 Low-tension glaucoma, right eye, severe stage: Secondary | ICD-10-CM | POA: Diagnosis not present

## 2020-03-04 ENCOUNTER — Encounter: Payer: Self-pay | Admitting: *Deleted

## 2020-03-13 DIAGNOSIS — H25813 Combined forms of age-related cataract, bilateral: Secondary | ICD-10-CM | POA: Diagnosis not present

## 2020-03-13 DIAGNOSIS — H401232 Low-tension glaucoma, bilateral, moderate stage: Secondary | ICD-10-CM | POA: Diagnosis not present

## 2020-03-13 DIAGNOSIS — H35363 Drusen (degenerative) of macula, bilateral: Secondary | ICD-10-CM | POA: Diagnosis not present

## 2020-03-13 DIAGNOSIS — H35371 Puckering of macula, right eye: Secondary | ICD-10-CM | POA: Diagnosis not present

## 2020-05-01 ENCOUNTER — Encounter: Payer: Medicare Other | Admitting: Internal Medicine

## 2020-05-29 DIAGNOSIS — H401131 Primary open-angle glaucoma, bilateral, mild stage: Secondary | ICD-10-CM | POA: Diagnosis not present

## 2020-06-01 ENCOUNTER — Encounter: Payer: Self-pay | Admitting: Internal Medicine

## 2020-06-01 NOTE — Patient Instructions (Signed)

## 2020-06-01 NOTE — Progress Notes (Addendum)
Annual Screening/Preventative Visit & Comprehensive Evaluation &  Examination  Future Appointments  Date Time Provider [place]  06/02/2020 11:00 AM Unk Pinto, MD [facility] None  09/03/2020  2:30 PM Liane Comber, NP [facility] None  06/02/2021  2:00 PM Unk Pinto, MD [facility] None       This very nice 75 y.o. MWF presents for a Screening  /Preventative Visit & comprehensive evaluation and management of multiple medical co-morbidities.  Patient has been followed for labile HTN, HLD, Prediabetes  and Vitamin D Deficiency.                                          Patient has hx/o Osteoporosis and had been off of Fosamax from 2019 until dexaBMD in Oct 2020 showed worsening with T -3.3 and Fosamax was restarted.        HTN predates circa 2012. Patient's BP has been controlled at home and patient denies any cardiac symptoms as chest pain, palpitations, shortness of breath, dizziness or ankle swelling. Today's BP is at goal  - 128/64.       Patient's hyperlipidemia is not controlled with diet. Last lipids were not at goal:  Lab Results  Component Value Date   CHOL 216 (H) 02/13/2020   HDL 60 02/13/2020   LDLCALC 128 (H) 02/13/2020   TRIG 164 (H) 02/13/2020   CHOLHDL 3.6 02/13/2020        Patient has hx/o  prediabetes (A1c 5.7% /2012 & 6.0% /2016).  Patient denies reactive hypoglycemic symptoms, visual blurring, diabetic polys or paresthesias. Last A1c was near goal:  Lab Results  Component Value Date   HGBA1C 5.7 (H) 10/23/2019                                         In 2015, patient was dx'd Hypothyroid and started on Thyroid Replacement.      Finally, patient has history of Vitamin D Deficiency and last Vitamin D was at goal:  Lab Results  Component Value Date   VD25OH 88 10/23/2019    Current Outpatient Medications on File Prior to Visit  Medication Sig   alendronate (FOSAMAX) 70 MG tablet TAKE 1 TABLET WEEKLY    VITAMIN C Take 1 tablet   daily.   aspirin EC 81 MG tablet Take  daily.   Calcium Citrate-Vitamin D Take daily.   VITAMIN D  2,000 u  Take   daily.    CINNAMON  1,000 mg  Take  2  times daily.    TRUSOPT 2 % ophth soln 1 DROP BOTH EYES TWICE A DAY   fish oil-omega-3 fatty acids 1000 MG capsule Take  daily.    FLAXSEED OIL 1000 MG CAPS Take daily.   levothyroxine  50 MCG tablet TAKE 1/2 TABLET DAILY    OSTEO BI-FLEX JOINT SHIELD  Take 1 tablet  2  times daily.   Multiple Vitamins-Minerals  Take 1 tablet  daily.   PRESERVISION AREDS  Take 1 tablet  daily.   Netarsudil-Latanoprost ([place]an)  Soln Place 1 drop into both eyes daily.   Tumeric w/ Black pepper ext 2 capsules daily.    zinc  50 MG tablet Take 5 daily.     Allergies  Allergen Reactions   Brimonidine Itching   Silver Sulfadiazine  Past Medical History:  Diagnosis Date   Arthritis    Cancer ([place]) 2016   squamous cell skin cancer    COPD (chronic obstructive pulmonary disease) ([facility])    Elevated hemoglobin A1c    Hyperlipidemia    Labile hypertension    Osteoporosis    Villous adenoma 2008    Health Maintenance  Topic Date Due   INFLUENZA VACCINE  09/22/2020   COLONOSCOP 02/07/2021   MAMMOGRAM  02/10/2022   TETANUS/TDAP  07/29/2026   DEXA SCAN  Completed   COVID-19 Vaccine  Completed   Hepatitis C Screening  Completed   PNA vac Low Risk Adult  Completed   HPV VACCINES  Aged Out    Immunization History  Administered Date(s) Administered   Influenza, High Dose Seasonal PF 12/20/2018, 01/02/2020   Influenza 12/23/2016, 01/23/2017   PFIZER  SARS-COV-2 Vacc 04/02/2019, 04/27/2019, 12/20/2019   Pneumococcal - 13 01/07/2014   Pneumococcal - 23 08/07/2015   Pneumococcal -23 02/23/1995   Td 02/22/2005, 07/28/2016   Zoster 12/16/2011    Last Colon - 02/08/2020 - Dr Ivor Messier [facility] - polyps with high grade dysplasia recommending 1 year f/u Colon  recommended 3 year f/u Colon - due Dec 2022  Last MGM - 02/13/2020  Past Surgical  History:  Procedure Laterality Date   HIP PINNING,CANNULATED Left 09/19/2012   Procedure: Percutaneous Screw Fixation Left Hip Fracture;  Surgeon: Nita Sells, MD;  Location: [place];  Service: Orthopedics;  Laterality: Left;   MANDIBLE SURGERY     TONSILLECTOMY      Family History  Problem Relation Age of Onset   Hypertension Mother    Diabetes Mother    Mitral valve prolapse Mother    Heart disease Mother    Cancer - Other Father        Leukemia and Melanoma   COPD Father    Stroke Father    CAD Paternal Grandfather    CAD Maternal Grandfather    Heart disease Brother    Cancer Maternal Aunt        colon   Cancer Paternal Aunt        colon    Social History   Tobacco Use   Smoking status: Former Smoker    Quit date: 02/22/1978    Years since quitting: 42.3   Smokeless tobacco: Never Used  Substance Use Topics   Alcohol use: Yes    Alcohol/week: 14.0 standard drinks    Types: 14 Standard drinks or equivalent per week   Drug use: No    ROS Constitutional: Denies fever, chills, weight loss/gain, headaches, insomnia,  night sweats, and change in appetite. Does c/o fatigue. Eyes: Denies redness, blurred vision, diplopia, discharge, itchy, watery eyes.  ENT: Denies discharge, congestion, post nasal drip, epistaxis, sore throat, earache, hearing loss, dental pain, Tinnitus, Vertigo, Sinus pain, snoring.  Cardio: Denies chest pain, palpitations, irregular heartbeat, syncope, dyspnea, diaphoresis, orthopnea, PND, claudication, edema Respiratory: denies cough, dyspnea, DOE, pleurisy, hoarseness, laryngitis, wheezing.  Gastrointestinal: Denies dysphagia, heartburn, reflux, water brash, pain, cramps, nausea, vomiting, bloating, diarrhea, constipation, hematemesis, melena, hematochezia, jaundice, hemorrhoids Genitourinary: Denies dysuria, frequency, urgency, nocturia, hesitancy, discharge, hematuria, flank pain Breast: Breast lumps, nipple discharge, bleeding.   Musculoskeletal: Denies arthralgia, myalgia, stiffness, Jt. Swelling, pain, limp, and strain/sprain. Denies falls. Skin: Denies puritis, rash, hives, warts, acne, eczema, changing in skin lesion Neuro: No weakness, tremor, incoordination, spasms, paresthesia, pain Psychiatric: Denies confusion, memory loss, sensory loss. Denies Depression. Endocrine: Denies change in weight, skin,  hair change, nocturia, and paresthesia, diabetic polys, visual blurring, hyper / hypo glycemic episodes.  Heme/Lymph: No excessive bleeding, bruising, enlarged lymph nodes.  Physical Exam  BP 128/64   Pulse 63   Temp (!) 97.5 F (36.4 C)   Resp 16   Ht 5' 7.5" (1.715 m)   Wt 116 lb 6.4 oz (52.8 kg)   SpO2 99%   BMI 17.96 kg/m   General Appearance: Well nourished, well groomed and in no apparent distress.  Eyes: PERRLA, EOMs, conjunctiva no swelling or erythema, normal fundi and vessels. Sinuses: No frontal/maxillary tenderness ENT/Mouth: EACs patent / TMs  nl. Nares clear without erythema, swelling, mucoid exudates. Oral hygiene is good. No erythema, swelling, or exudate. Tongue normal, non-obstructing. Tonsils not swollen or erythematous. Hearing normal.  Neck: Supple, thyroid not palpable. No bruits, nodes or JVD. Respiratory: Respiratory effort normal.  BS equal and clear bilateral without rales, rhonci, wheezing or stridor. Cardio: Heart sounds are normal with regular rate and rhythm and no murmurs, rubs or gallops. Peripheral pulses are normal and equal bilaterally without edema. No aortic or femoral bruits. Chest: symmetric with normal excursions and percussion. Breasts: Symmetric, without lumps, nipple discharge, retractions, or fibrocystic changes.  Abdomen: Flat, soft with bowel sounds active. Nontender, no guarding, rebound, hernias, masses, or organomegaly.  Lymphatics: Non tender without lymphadenopathy.  Genitourinary:  Musculoskeletal: Full ROM all peripheral extremities, joint stability,  5/5 strength, and normal gait. Skin: Warm and dry without rashes, lesions, cyanosis, clubbing or  ecchymosis.  Neuro: Cranial nerves intact, reflexes equal bilaterally. Normal muscle tone, no cerebellar symptoms. Sensation intact.  Pysch: Alert and oriented X 3, normal affect, Insight and Judgment appropriate.   Assessment and Plan  1. Annual Preventative Screening Examination   2. Labile hypertension  - EKG 12-Lead - Urinalysis, Routine w reflex microscopic - Microalbumin / creatinine urine ratio - CBC with Differential/Platelet - COMPLETE METABOLIC PANEL WITH GFR - Magnesium - TSH  3. Hyperlipidemia, mixed  - EKG 12-Lead - Lipid panel - TSH  4. Abnormal glucose  - EKG 12-Lead - Hemoglobin A1c - Insulin, random  5. Hypothyroidism, unspecified type  - VITAMIN D 25 Hydroxy   6. Age-related osteoporosis without current pathological fracture  - COMPLETE METABOLIC PANEL WITH GFR  7. Serrated polyposis syndrome   8. Screening for ischemic heart disease  - EKG 12-Lead  9. FHx: heart disease  - EKG 12-Lead  10. Former smoker  - EKG 12-Lead  11. Medication management  - Urinalysis, Routine w reflex microscopic - Microalbumin / creatinine urine ratio - CBC with Differential/Platelet - COMPLETE METABOLIC PANEL WITH GFR - Magnesium - Lipid panel - TSH - Hemoglobin A1c - Insulin, random - VITAMIN D 25 Hydroxy              Patient was counseled in prudent diet to achieve/maintain BMI less than 25 for weight control, BP monitoring, regular exercise and medications. Discussed med's effects and SE's. Screening labs and tests as requested with regular follow-up as recommended. Over 40 minutes of exam, counseling, chart review and high complex critical decision making was performed.   Kirtland Bouchard, MD

## 2020-06-02 ENCOUNTER — Ambulatory Visit: Payer: Medicare PPO | Admitting: Internal Medicine

## 2020-06-02 ENCOUNTER — Encounter: Payer: Self-pay | Admitting: Internal Medicine

## 2020-06-02 ENCOUNTER — Other Ambulatory Visit: Payer: Self-pay

## 2020-06-02 VITALS — BP 128/64 | HR 63 | Temp 97.5°F | Resp 16 | Ht 67.5 in | Wt 116.4 lb

## 2020-06-02 DIAGNOSIS — M810 Age-related osteoporosis without current pathological fracture: Secondary | ICD-10-CM

## 2020-06-02 DIAGNOSIS — R0989 Other specified symptoms and signs involving the circulatory and respiratory systems: Secondary | ICD-10-CM | POA: Diagnosis not present

## 2020-06-02 DIAGNOSIS — Z136 Encounter for screening for cardiovascular disorders: Secondary | ICD-10-CM

## 2020-06-02 DIAGNOSIS — Z0000 Encounter for general adult medical examination without abnormal findings: Secondary | ICD-10-CM | POA: Diagnosis not present

## 2020-06-02 DIAGNOSIS — Z0001 Encounter for general adult medical examination with abnormal findings: Secondary | ICD-10-CM

## 2020-06-02 DIAGNOSIS — Z79899 Other long term (current) drug therapy: Secondary | ICD-10-CM

## 2020-06-02 DIAGNOSIS — D126 Benign neoplasm of colon, unspecified: Secondary | ICD-10-CM

## 2020-06-02 DIAGNOSIS — Z8249 Family history of ischemic heart disease and other diseases of the circulatory system: Secondary | ICD-10-CM | POA: Diagnosis not present

## 2020-06-02 DIAGNOSIS — R7309 Other abnormal glucose: Secondary | ICD-10-CM

## 2020-06-02 DIAGNOSIS — Z87891 Personal history of nicotine dependence: Secondary | ICD-10-CM

## 2020-06-02 DIAGNOSIS — E782 Mixed hyperlipidemia: Secondary | ICD-10-CM

## 2020-06-02 DIAGNOSIS — E039 Hypothyroidism, unspecified: Secondary | ICD-10-CM

## 2020-06-03 ENCOUNTER — Other Ambulatory Visit: Payer: Self-pay | Admitting: Internal Medicine

## 2020-06-03 DIAGNOSIS — E782 Mixed hyperlipidemia: Secondary | ICD-10-CM

## 2020-06-03 LAB — CBC WITH DIFFERENTIAL/PLATELET
Absolute Monocytes: 584 cells/uL (ref 200–950)
Basophils Absolute: 104 cells/uL (ref 0–200)
Basophils Relative: 1.3 %
Eosinophils Absolute: 288 cells/uL (ref 15–500)
Eosinophils Relative: 3.6 %
HCT: 41.5 % (ref 35.0–45.0)
Hemoglobin: 12.9 g/dL (ref 11.7–15.5)
Lymphs Abs: 1592 cells/uL (ref 850–3900)
MCH: 28 pg (ref 27.0–33.0)
MCHC: 31.1 g/dL — ABNORMAL LOW (ref 32.0–36.0)
MCV: 90 fL (ref 80.0–100.0)
MPV: 11.8 fL (ref 7.5–12.5)
Monocytes Relative: 7.3 %
Neutro Abs: 5432 cells/uL (ref 1500–7800)
Neutrophils Relative %: 67.9 %
Platelets: 264 10*3/uL (ref 140–400)
RBC: 4.61 10*6/uL (ref 3.80–5.10)
RDW: 13.3 % (ref 11.0–15.0)
Total Lymphocyte: 19.9 %
WBC: 8 10*3/uL (ref 3.8–10.8)

## 2020-06-03 LAB — URINALYSIS, ROUTINE W REFLEX MICROSCOPIC
Bilirubin Urine: NEGATIVE
Glucose, UA: NEGATIVE
Hgb urine dipstick: NEGATIVE
Ketones, ur: NEGATIVE
Leukocytes,Ua: NEGATIVE
Nitrite: NEGATIVE
Protein, ur: NEGATIVE
Specific Gravity, Urine: 1.003 (ref 1.001–1.03)
pH: 6.5 (ref 5.0–8.0)

## 2020-06-03 LAB — HEMOGLOBIN A1C
Hgb A1c MFr Bld: 5.7 % of total Hgb — ABNORMAL HIGH (ref <5.7)
Mean Plasma Glucose: 117 mg/dL
eAG (mmol/L): 6.5 mmol/L

## 2020-06-03 LAB — LIPID PANEL
Cholesterol: 207 mg/dL — ABNORMAL HIGH (ref <200)
HDL: 74 mg/dL (ref >=50)
LDL Cholesterol (Calc): 107 mg/dL (calc) — ABNORMAL HIGH
Non-HDL Cholesterol (Calc): 133 mg/dL (calc) — ABNORMAL HIGH (ref <130)
Total CHOL/HDL Ratio: 2.8 (calc) (ref <5.0)
Triglycerides: 150 mg/dL — ABNORMAL HIGH (ref <150)

## 2020-06-03 LAB — MICROALBUMIN / CREATININE URINE RATIO
Creatinine, Urine: 15 mg/dL — ABNORMAL LOW (ref 20–275)
Microalb, Ur: <0.2 mg/dL

## 2020-06-03 LAB — INSULIN, RANDOM: Insulin: 49.7 u[IU]/mL — ABNORMAL HIGH

## 2020-06-03 LAB — COMPLETE METABOLIC PANEL WITH GFR
AG Ratio: 1.5 (calc) (ref 1.0–2.5)
ALT: 17 U/L (ref 6–29)
AST: 20 U/L (ref 10–35)
Albumin: 4.1 g/dL (ref 3.6–5.1)
Alkaline phosphatase (APISO): 61 U/L (ref 37–153)
BUN/Creatinine Ratio: 16 (calc) (ref 6–22)
BUN: 9 mg/dL (ref 7–25)
CO2: 29 mmol/L (ref 20–32)
Calcium: 9.2 mg/dL (ref 8.6–10.4)
Chloride: 100 mmol/L (ref 98–110)
Creat: 0.56 mg/dL — ABNORMAL LOW (ref 0.60–0.93)
GFR, Est African American: 108 mL/min/{1.73_m2} (ref >=60)
GFR, Est Non African American: 93 mL/min/{1.73_m2} (ref >=60)
Globulin: 2.7 g/dL (calc) (ref 1.9–3.7)
Glucose, Bld: 142 mg/dL — ABNORMAL HIGH (ref 65–99)
Potassium: 3.7 mmol/L (ref 3.5–5.3)
Sodium: 138 mmol/L (ref 135–146)
Total Bilirubin: 0.6 mg/dL (ref 0.2–1.2)
Total Protein: 6.8 g/dL (ref 6.1–8.1)

## 2020-06-03 LAB — MAGNESIUM: Magnesium: 2 mg/dL (ref 1.5–2.5)

## 2020-06-03 LAB — VITAMIN D 25 HYDROXY (VIT D DEFICIENCY, FRACTURES): Vit D, 25-Hydroxy: 94 ng/mL (ref 30–100)

## 2020-06-03 LAB — TSH: TSH: 4.08 mIU/L (ref 0.40–4.50)

## 2020-06-03 MED ORDER — EZETIMIBE 10 MG PO TABS: ORAL_TABLET | ORAL | 3 refills | Status: DC

## 2020-06-10 ENCOUNTER — Other Ambulatory Visit: Payer: Self-pay | Admitting: Adult Health

## 2020-06-23 DIAGNOSIS — H401213 Low-tension glaucoma, right eye, severe stage: Secondary | ICD-10-CM | POA: Diagnosis not present

## 2020-06-23 DIAGNOSIS — H401221 Low-tension glaucoma, left eye, mild stage: Secondary | ICD-10-CM | POA: Diagnosis not present

## 2020-07-11 DIAGNOSIS — H401221 Low-tension glaucoma, left eye, mild stage: Secondary | ICD-10-CM | POA: Diagnosis not present

## 2020-07-11 DIAGNOSIS — H401213 Low-tension glaucoma, right eye, severe stage: Secondary | ICD-10-CM | POA: Diagnosis not present

## 2020-07-11 DIAGNOSIS — H25813 Combined forms of age-related cataract, bilateral: Secondary | ICD-10-CM | POA: Diagnosis not present

## 2020-07-11 DIAGNOSIS — H1132 Conjunctival hemorrhage, left eye: Secondary | ICD-10-CM | POA: Diagnosis not present

## 2020-07-24 ENCOUNTER — Ambulatory Visit: Payer: Medicare PPO | Admitting: Adult Health

## 2020-09-02 ENCOUNTER — Encounter: Payer: Self-pay | Admitting: Adult Health

## 2020-09-02 DIAGNOSIS — H401290 Low-tension glaucoma, unspecified eye, stage unspecified: Secondary | ICD-10-CM | POA: Insufficient documentation

## 2020-09-02 NOTE — Progress Notes (Signed)
MEDICARE ANNUAL WELLNESS VISIT AND FOLLOW UP  Assessment:    Annual Medicare annual wellness visit Due annually  Health maintenance reviewed Discussed shingrix, will check with insurance  Hypothyroidism, unspecified hypothyroidism type -cont levothyroxine - TSH   Labile hypertension -Atypically elevated today, restart checking, historically labile - DASH diet and lifestyle review -currently managed by lifestyle - goal <130/80 - Close follow up with log in 1 month   Hyperlipidemia -diet and exercise , high fiber discussed -check lipid panel - does have family history, hesitant to start meds - risks/benefits discussed, suggested CT coronary calcium study for risk screening, interested in pursuing - order placed to schedule  Medication management - CBC with Differential/Platelet - CMP/GFR  Chronic obstructive pulmonary disease, unspecified COPD type ([place]) -former smoker, no active sx, cont to monitor -no current treatment needed  Osteoporosis -back on fosamax 11/2018  - recommend high calcium diet/low dose supplement if needed, vitamin D, weight bearing exercises -Bone Density scan in 2 years - ordered to schedule with mmg this year   Elevated hemoglobin A1c -check a1c at CPE -diet and exercise as tolerated   Vitamin D deficiency At goal at recent check; continue to recommend supplementation for goal of 60-100 Defer vitamin D level  Personal history of colon polyps Due follow up colonoscopy 01/2021 has number to call   Glaucoma  Ophth is managing  Mild protein/calorie malnutrition ([place]) Weights slowly trending down, check CMP Discussed high protein high calorie diet, nuts, avocado, beans  Ideally gain 10 lb in upcoming year   BMI less than 19,adult -exercise as tolerated -cont increased calorie load   Over 40 minutes of exam, counseling, chart review and critical decision making was performed Future Appointments  Date Time Provider [place]   01/14/2021  2:30 PM Liane Comber, NP [facility] None  06/02/2021  2:00 PM Unk Pinto, MD [facility] None     Plan:   During the course of the visit the patient was educated and counseled about appropriate screening and preventive services including:   Pneumococcal vaccine  Prevnar 13 Influenza vaccine Td vaccine Screening electrocardiogram Bone densitometry screening Colorectal cancer screening Diabetes screening Glaucoma screening Nutrition counseling  Advanced directives: requested   Subjective:  Katie Morton is a 75 y.o. female who presents for Medicare Annual Wellness Visit and 3 month follow up.   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   Follows annually with Dr. Jari Pigg for history of SCC.   Recently underwent colonoscopy 02/06/2020 by Dr. Stephanie Acre at [facility] due to 20+ polyps, all beninign but 1 year recall due to number.   She is following with Dr. Ander Slade at [facility] for glaucoma, stable.   BMI is Body mass index is 17.75 kg/m., she has been working on diet and exercise, has had low BMI all her life, walks 45 min most days  Wt Readings from Last 3 Encounters:  09/03/20 115 lb (52.2 kg)  06/02/20 116 lb 6.4 oz (52.8 kg)  02/13/20 117 lb (53.1 kg)    She has BP cuff but hasn't been checking recently, today their BP is BP: (!) 162/78 by manual recheck, historically labile She does workout. She denies chest pain, shortness of breath, dizziness.   She is not on cholesterol medication (taking omega 3 supplement, was prescribed rosuvastatin and zetia but never started per personal preference, discussed CT coronary calcium which she would like to pursue) and denies myalgias. Her cholesterol is not at goal. The cholesterol last  visit was:   Lab Results  Component Value Date   CHOL 207 (H) 06/02/2020   HDL 74 06/02/2020   LDLCALC 107 (H) 06/02/2020   TRIG 150 (H) 06/02/2020   CHOLHDL 2.8 06/02/2020    She has been working  on diet and exercise for hx of prediabetes recently well controlled, and denies increased appetite, nausea, paresthesia of the feet, polydipsia, polyuria and visual disturbances. Last A1C in the office was:  Lab Results  Component Value Date   HGBA1C 5.7 (H) 06/02/2020   She is on thyroid medication. Her medication was not changed last visit.  Taking 25 mcg daily.  Lab Results  Component Value Date   TSH 4.08 06/02/2020   Last GFR: Lab Results  Component Value Date   GFRNONAA 93 06/02/2020   Patient is on Vitamin D supplement.   Lab Results  Component Value Date   VD25OH 94 06/02/2020      Medication Review: Current Outpatient Medications on File Prior to Visit  Medication Sig Dispense Refill   alendronate (FOSAMAX) 70 MG tablet TAKE 1 TABLET ONCE WEEKLY ON AN EMPTY STOMACH WITH A FULL GLASS OF WATER. DONT LIE DOWN AFTER TAKING 12 tablet 3   Ascorbic Acid (VITAMIN C PO) Take 1 tablet by mouth daily.     aspirin EC 81 MG tablet Take 81 mg by mouth daily.     Calcium Citrate-Vitamin D (CALCIUM CITRATE + D PO) Take by mouth daily.     Cholecalciferol (VITAMIN D PO) Take 2,000 Units by mouth daily.     CINNAMON PO Take 1,000 mg by mouth 2 (two) times daily.     dorzolamide (TRUSOPT) 2 % ophthalmic solution 3 (three) times daily.  11   fish oil-omega-3 fatty acids 1000 MG capsule Take 1 g by mouth daily.      Flaxseed, Linseed, (FLAXSEED OIL) 1000 MG CAPS Take 1,000 mg by mouth daily.     levothyroxine (SYNTHROID) 50 MCG tablet TAKE 1/2 TABLET DAILY ON AN EMPTY STOMACH FOR 30 MIN 45 tablet 3   Misc Natural Products (OSTEO BI-FLEX JOINT SHIELD PO) Take 1 tablet by mouth 2 (two) times daily.     Multiple Vitamins-Minerals (MULTIVITAMIN WITH MINERALS) tablet Take 1 tablet by mouth daily.     Multiple Vitamins-Minerals (PRESERVISION AREDS PO) Take 1 tablet by mouth daily.     Netarsudil-Latanoprost (ROCKLATAN) 0.02-0.005 % SOLN Place 1 drop into both eyes daily.     OVER THE COUNTER  MEDICATION 2 capsules daily. Tumeric w/ Black pepper extract     zinc gluconate 50 MG tablet Take 50 mg by mouth daily.     ezetimibe (ZETIA) 10 MG tablet Take  1 tablet  Daily  for  Cholesterol (Patient not taking: Reported on 09/03/2020) 90 tablet 3   No current facility-administered medications on file prior to visit.    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   Personal history of colonic polyps 07/25/2019   History of squamous cell carcinoma 07/25/2019   Protein-calorie malnutrition ([place]) 02/28/2018   BMI less than 19,adult 01/23/2015   Hypothyroidism 06/11/2014   Medication management 01/01/2013   Vitamin D deficiency 01/01/2013   Hyperlipidemia    Abnormal glucose    Osteoporosis 09/20/2012   COPD (chronic obstructive pulmonary disease) ([place])  Screening Tests Immunization History  Administered Date(s) Administered   Influenza, High Dose Seasonal PF 01/02/2013, 01/07/2014, 01/23/2015, 12/01/2015, 01/19/2017, 12/29/2017, 12/20/2018, 01/02/2020   Influenza-Unspecified 12/16/2011, 12/23/2016, 01/23/2017   PFIZER(Purple Top)SARS-COV-2 Vaccination 04/02/2019, 04/27/2019, 12/20/2019   Pneumococcal Conjugate-13 01/07/2014   Pneumococcal Polysaccharide-23 08/07/2015   Pneumococcal-Unspecified 02/23/1995   Td 02/22/2005, 07/28/2016   Zoster, Live 12/16/2011    Preventative care: Last colonoscopy: 01/2020 due 01/2021 per Dr. Stephanie Acre at [place] mammogram: 01/2020 DEXA: 08/2016, started on fosamax x 2015, stopped early 2019, last DEXA 11/2018 with radius T score -3.3, restarted fosamax 11/2018, repeat with next mammogram   Prior vaccinations: TD or Tdap: 2018 Influenza: 2021 Pneumococcal: 2017 Prevnar13: 2015 Shingles/Zostavax: 2013 - check with insurance  Covid 19: 2/2, 2021, pfizer +  booster  Names of Other Physician/Practitioners you currently use: 1. [facility]- here for primary care 2. Dr. Leanord Asal, eye doctor, last visit 2022, monitoring cataract and glaucoma 3. Dr. Gilford Rile, dentist, last visit 2022, goes q61m  Patient Care Team: Unk Pinto, MD as PCP - General (Internal Medicine) Laurence Spates, MD (Inactive) as Consulting Physician (Gastroenterology) Constance Holster, DDS Jari Pigg, MD as Consulting Physician (Dermatology) Roseanne Kaufman, MD as Consulting Physician (Orthopedic Surgery)  SURGICAL HISTORY She  has a past surgical history that includes Tonsillectomy; Mandible surgery; and Hip pinning, cannulated (Left, 09/19/2012). FAMILY HISTORY Her family history includes CAD in her maternal grandfather and paternal grandfather; COPD in her father; Cancer in her maternal aunt and paternal aunt; Cancer - Other in her father; Diabetes in her mother; Heart disease in her brother and mother; Hypertension in her mother; Mitral valve prolapse in her mother; Stroke in her father. SOCIAL HISTORY She  reports that she quit smoking about 42 years ago. Her smoking use included cigarettes. She has never used smokeless tobacco. She reports current alcohol use of about 14.0 standard drinks of alcohol per week. She reports that she does not use drugs.   MEDICARE WELLNESS OBJECTIVES: Physical activity: Current Exercise Habits: Home exercise routine, Type of exercise: walking, Time (Minutes): 45, Frequency (Times/Week): 7, Weekly Exercise (Minutes/Week): 315, Intensity: Mild, Exercise limited by: None identified Cardiac risk factors: Cardiac Risk Factors include: advanced age (>58men, >73 women);dyslipidemia;hypertension;smoking/ tobacco exposure Depression/mood screen:   Depression screen [facility] 2/9 09/03/2020  Decreased Interest 0  Down, Depressed, Hopeless 0  PHQ - 2 Score 0    ADLs:  In your present state of health, do you have any  difficulty performing the following activities: 09/03/2020 06/01/2020  Hearing? N N  Vision? N N  Difficulty concentrating or making decisions? N N  Walking or climbing stairs? N N  Dressing or bathing? N N  Doing errands, shopping? N N  Some recent data might be hidden     Cognitive Testing  Alert? Yes  Normal Appearance?Yes  Oriented to person? Yes  Place? Yes   Time? Yes  Recall of three objects?  Yes  Can perform simple calculations? Yes  Displays appropriate judgment?Yes  Can read the correct time from a watch face?Yes  EOL planning: Does Patient Have a Medical Advance Directive?: Yes Type of Advance Directive: Healthcare Power of Attorney, Living will Does patient want to make changes to medical advance directive?: No - Patient declined Copy of [place] in Chart?: No - copy requested  Review of Systems  Constitutional:  Negative for malaise/fatigue and weight loss.  HENT:  Negative for hearing loss and tinnitus.   Eyes:  Negative for blurred  vision and double vision.  Respiratory:  Negative for cough, shortness of breath and wheezing.   Cardiovascular:  Negative for chest pain, palpitations, orthopnea, claudication and leg swelling.  Gastrointestinal:  Negative for abdominal pain, blood in stool, constipation, diarrhea, heartburn, melena, nausea and vomiting.  Genitourinary: Negative.   Musculoskeletal:  Negative for falls, joint pain (right hand pain intermittently, weak grip) and myalgias.  Skin:  Negative for rash.  Neurological:  Negative for dizziness, tingling, sensory change, weakness and headaches.  Endo/Heme/Allergies:  Negative for polydipsia.  Psychiatric/Behavioral: Negative.    All other systems reviewed and are negative.   Objective:     Today's Vitals   09/03/20 1412 09/03/20 1441  BP: (!) 158/80 (!) 162/78  Pulse: (!) 55   Temp: (!) 97.5 F (36.4 C)   SpO2: 99%   Weight: 115 lb (52.2 kg)    Body mass index is 17.75  kg/m.  General appearance: alert, no distress, WD/WN, female HEENT: normocephalic, sclerae anicteric, TMs pearly, nares patent, no discharge or erythema, pharynx normal Oral cavity: MMM, no lesions Neck: supple, no lymphadenopathy, no thyromegaly, no masses Heart: RRR, normal S1, S2, no murmurs Lungs: CTA bilaterally, no wheezes, rhonchi, or rales Abdomen: +bs, soft, non tender, non distended, no masses, no hepatomegaly, no splenomegaly Musculoskeletal: nontender, no swelling; she has reduced ROM of R 1st digit base with mild contracture  Extremities: no edema, no cyanosis, no clubbing Pulses: 2+ symmetric, upper and lower extremities, normal cap refill Neurological: alert, oriented x 3, CN2-12 intact, strength normal upper extremities and lower extremities, sensation normal throughout, DTRs 2+ throughout, no cerebellar signs, gait normal Psychiatric: normal affect, behavior normal, pleasant   Medicare Attestation I have personally reviewed: The patient's medical and social history Their use of alcohol, tobacco or illicit drugs Their current medications and supplements The patient's functional ability including ADLs,fall risks, home safety risks, cognitive, and hearing and visual impairment Diet and physical activities Evidence for depression or mood disorders  The patient's weight, height, BMI, and visual acuity have been recorded in the chart.  I have made referrals, counseling, and provided education to the patient based on review of the above and I have provided the patient with a written personalized care plan for preventive services.     Izora Ribas, NP   09/03/2020

## 2020-09-03 ENCOUNTER — Other Ambulatory Visit: Payer: Self-pay

## 2020-09-03 ENCOUNTER — Encounter: Payer: Self-pay | Admitting: Adult Health

## 2020-09-03 ENCOUNTER — Ambulatory Visit: Payer: Medicare PPO | Admitting: Adult Health

## 2020-09-03 VITALS — BP 162/78 | HR 55 | Temp 97.5°F | Wt 115.0 lb

## 2020-09-03 DIAGNOSIS — E039 Hypothyroidism, unspecified: Secondary | ICD-10-CM | POA: Diagnosis not present

## 2020-09-03 DIAGNOSIS — Z8589 Personal history of malignant neoplasm of other organs and systems: Secondary | ICD-10-CM

## 2020-09-03 DIAGNOSIS — J449 Chronic obstructive pulmonary disease, unspecified: Secondary | ICD-10-CM | POA: Diagnosis not present

## 2020-09-03 DIAGNOSIS — Z86010 Personal history of colonic polyps: Secondary | ICD-10-CM | POA: Diagnosis not present

## 2020-09-03 DIAGNOSIS — Z8249 Family history of ischemic heart disease and other diseases of the circulatory system: Secondary | ICD-10-CM

## 2020-09-03 DIAGNOSIS — Z0000 Encounter for general adult medical examination without abnormal findings: Secondary | ICD-10-CM

## 2020-09-03 DIAGNOSIS — Z79899 Other long term (current) drug therapy: Secondary | ICD-10-CM

## 2020-09-03 DIAGNOSIS — R0989 Other specified symptoms and signs involving the circulatory and respiratory systems: Secondary | ICD-10-CM | POA: Insufficient documentation

## 2020-09-03 DIAGNOSIS — R7309 Other abnormal glucose: Secondary | ICD-10-CM

## 2020-09-03 DIAGNOSIS — Z0001 Encounter for general adult medical examination with abnormal findings: Secondary | ICD-10-CM | POA: Diagnosis not present

## 2020-09-03 DIAGNOSIS — H401230 Low-tension glaucoma, bilateral, stage unspecified: Secondary | ICD-10-CM

## 2020-09-03 DIAGNOSIS — Z136 Encounter for screening for cardiovascular disorders: Secondary | ICD-10-CM

## 2020-09-03 DIAGNOSIS — Z681 Body mass index (BMI) 19 or less, adult: Secondary | ICD-10-CM

## 2020-09-03 DIAGNOSIS — E559 Vitamin D deficiency, unspecified: Secondary | ICD-10-CM

## 2020-09-03 DIAGNOSIS — E46 Unspecified protein-calorie malnutrition: Secondary | ICD-10-CM | POA: Diagnosis not present

## 2020-09-03 DIAGNOSIS — R6889 Other general symptoms and signs: Secondary | ICD-10-CM

## 2020-09-03 DIAGNOSIS — E782 Mixed hyperlipidemia: Secondary | ICD-10-CM | POA: Diagnosis not present

## 2020-09-03 DIAGNOSIS — M810 Age-related osteoporosis without current pathological fracture: Secondary | ICD-10-CM | POA: Diagnosis not present

## 2020-09-03 NOTE — Patient Instructions (Addendum)
Goals      Blood Pressure < 130/80     LDL CALC < 100       Ask insurance about shingrix - 2 shots 2-6 months apart   HYPERTENSION INFORMATION  Monitor your blood pressure at home, please keep a record and bring that in with you to your next office visit.   Go to the ER if any CP, SOB, nausea, dizziness, severe HA, changes vision/speech  Testing/Procedures: HOW TO TAKE YOUR BLOOD PRESSURE: Rest 5 minutes before taking your blood pressure. Don't smoke or drink caffeinated beverages for at least 30 minutes before. Take your blood pressure before (not after) you eat. Sit comfortably with your back supported and both feet on the floor (don't cross your legs). Elevate your arm to heart level on a table or a desk. Use the proper sized cuff. It should fit smoothly and snugly around your bare upper arm. There should be enough room to slip a fingertip under the cuff. The bottom edge of the cuff should be 1 inch above the crease of the elbow.  Due to a recent study, SPRINT, we have changed our goal for the systolic or top blood pressure number. Ideally we want your top number at 120.  In the [facility] Trial, 5000 people were randomized to a goal BP of 120 and 5000 people were randomized to a goal BP of less than 140. The patients with the goal BP at 120 had LESS DEMENTIA, LESS HEART ATTACKS, AND LESS STROKES, AS WELL AS OVERALL DECREASED MORTALITY OR DEATH RATE.   There was another study that showed taking your blood pressure medications at night decrease cardiovascular events.  However if you are on a fluid pill, please take this in the morning.   If you are willing, our goal BP is the top number of 120.   Your most recent BP: BP: (!) 162/78   Take your medications faithfully as instructed. Maintain a healthy weight. Get at least 150 minutes of aerobic exercise per week. Minimize salt intake. Minimize alcohol intake  DASH Eating Plan DASH stands for "Dietary Approaches to Stop  Hypertension." The DASH eating plan is a healthy eating plan that has been shown to reduce high blood pressure (hypertension). Additional health benefits may include reducing the risk of type 2 diabetes mellitus, heart disease, and stroke. The DASH eating plan may also help with weight loss. WHAT DO I NEED TO KNOW ABOUT THE DASH EATING PLAN? For the DASH eating plan, you will follow these general guidelines: Choose foods with a percent daily value for sodium of less than 5% (as listed on the food label). Use salt-free seasonings or herbs instead of table salt or sea salt. Check with your health care provider or pharmacist before using salt substitutes. Eat lower-sodium products, often labeled as "lower sodium" or "no salt added." Eat fresh foods. Eat more vegetables, fruits, and low-fat dairy products. Choose whole grains. Look for the word "whole" as the first word in the ingredient list. Choose fish and skinless chicken or [place] more often than red meat. Limit fish, poultry, and meat to 6 oz (170 g) each day. Limit sweets, desserts, sugars, and sugary drinks. Choose heart-healthy fats. Limit cheese to 1 oz (28 g) per day. Eat more home-cooked food and less restaurant, buffet, and fast food. Limit fried foods. Cook foods using methods other than frying. Limit canned vegetables. If you do use them, rinse them well to decrease the sodium. When eating at a restaurant, ask  that your food be prepared with less salt, or no salt if possible. WHAT FOODS CAN I EAT? Seek help from a dietitian for individual calorie needs. Grains Whole grain or whole wheat bread. Brown rice. Whole grain or whole wheat pasta. Quinoa, bulgur, and whole grain cereals. Low-sodium cereals. Corn or whole wheat flour tortillas. Whole grain cornbread. Whole grain crackers. Low-sodium crackers. Vegetables Fresh or frozen vegetables (raw, steamed, roasted, or grilled). Low-sodium or reduced-sodium tomato and vegetable juices.  Low-sodium or reduced-sodium tomato sauce and paste. Low-sodium or reduced-sodium canned vegetables.  Fruits All fresh, canned (in natural juice), or frozen fruits. Meat and Other Protein Products Ground beef (85% or leaner), grass-fed beef, or beef trimmed of fat. Skinless chicken or [place]. Ground chicken or [place]. Pork trimmed of fat. All fish and seafood. Eggs. Dried beans, peas, or lentils. Unsalted nuts and seeds. Unsalted canned beans. Dairy Low-fat dairy products, such as skim or 1% milk, 2% or reduced-fat cheeses, low-fat ricotta or cottage cheese, or plain low-fat yogurt. Low-sodium or reduced-sodium cheeses. Fats and Oils Tub margarines without trans fats. Light or reduced-fat mayonnaise and salad dressings (reduced sodium). Avocado. Safflower, olive, or canola oils. Natural peanut or almond butter. Other Unsalted popcorn and pretzels. The items listed above may not be a complete list of recommended foods or beverages. Contact your dietitian for more options. WHAT FOODS ARE NOT RECOMMENDED? Grains White bread. White pasta. White rice. Refined cornbread. Bagels and croissants. Crackers that contain trans fat. Vegetables Creamed or fried vegetables. Vegetables in a cheese sauce. Regular canned vegetables. Regular canned tomato sauce and paste. Regular tomato and vegetable juices. Fruits Dried fruits. Canned fruit in light or heavy syrup. Fruit juice. Meat and Other Protein Products Fatty cuts of meat. Ribs, chicken wings, bacon, sausage, bologna, salami, chitterlings, fatback, hot dogs, bratwurst, and packaged luncheon meats. Salted nuts and seeds. Canned beans with salt. Dairy Whole or 2% milk, cream, half-and-half, and cream cheese. Whole-fat or sweetened yogurt. Full-fat cheeses or blue cheese. Nondairy creamers and whipped toppings. Processed cheese, cheese spreads, or cheese curds. Condiments Onion and garlic salt, seasoned salt, table salt, and sea salt. Canned and packaged  gravies. Worcestershire sauce. Tartar sauce. Barbecue sauce. Teriyaki sauce. Soy sauce, including reduced sodium. Steak sauce. Fish sauce. Oyster sauce. Cocktail sauce. Horseradish. Ketchup and mustard. Meat flavorings and tenderizers. Bouillon cubes. Hot sauce. Tabasco sauce. Marinades. Taco seasonings. Relishes. Fats and Oils Butter, stick margarine, lard, shortening, ghee, and bacon fat. Coconut, palm kernel, or palm oils. Regular salad dressings. Other Pickles and olives. Salted popcorn and pretzels. The items listed above may not be a complete list of foods and beverages to avoid. Contact your dietitian for more information. WHERE CAN I FIND MORE INFORMATION? National Heart, Lung, and Blood Institute: travelstabloid.com Document Released: 01/28/2011 Document Revised: 06/25/2013 Document Reviewed: 12/13/2012 [place] [employer] Patient Information 2015 [place], [place]. This information is not intended to replace advice given to you by your health care provider. Make sure you discuss any questions you have with your health care provider.   Coronary Calcium Scan A coronary calcium scan is an imaging test used to look for deposits of plaque in the inner lining of the blood vessels of the heart (coronary arteries). Plaque is made up of calcium, protein, and fatty substances. These deposits of plaque can partly clog and narrow the coronary arteries without producingany symptoms or warning signs. This puts a person at risk for a heart attack. This test is recommended for people who are at moderate risk  for heart disease.The test can find plaque deposits before symptoms develop. Tell a health care provider about: Any allergies you have. All medicines you are taking, including vitamins, herbs, eye drops, creams, and over-the-counter medicines. Any problems you or family members have had with anesthetic medicines. Any blood disorders you have. Any surgeries you have had. Any  medical conditions you have. Whether you are pregnant or may be pregnant. What are the risks? Generally, this is a safe procedure. However, problems may occur, including: Harm to a pregnant woman and her unborn baby. This test involves the use of radiation. Radiation exposure can be dangerous to a pregnant woman and her unborn baby. If you are pregnant or think you may be pregnant, you should not have this procedure done. Slight increase in the risk of cancer. This is because of the radiation involved in the test. What happens before the procedure? Ask your health care provider for any specific instructions on how to prepare for this procedure. You may be asked to avoid products that contain caffeine,tobacco, or nicotine for 4 hours before the procedure. What happens during the procedure?  You will undress and remove any jewelry from your neck or chest. You will put on a hospital gown. Sticky electrodes will be placed on your chest. The electrodes will be connected to an electrocardiogram (ECG) machine to record a tracing of the electrical activity of your heart. You will lie down on a curved bed that is attached to the [place]. You may be given medicine to slow down your heart rate so that clear pictures can be created. You will be moved into the CT scanner, and the CT scanner will take pictures of your heart. During this time, you will be asked to lie still and hold your breath for 2-3 seconds at a time while each picture of your heart is being taken. The procedure may vary among health care providers and hospitals. What happens after the procedure? You can get dressed. You can return to your normal activities. It is up to you to get the results of your procedure. Ask your health care provider, or the department that is doing the procedure, when your results will be ready. Summary A coronary calcium scan is an imaging test used to look for deposits of plaque in the inner lining of the  blood vessels of the heart (coronary arteries). Plaque is made up of calcium, protein, and fatty substances. Generally, this is a safe procedure. Tell your health care provider if you are pregnant or may be pregnant. Ask your health care provider for any specific instructions on how to prepare for this procedure. A CT scanner will take pictures of your heart. You can return to your normal activities after the scan is done. This information is not intended to replace advice given to you by your health care provider. Make sure you discuss any questions you have with your healthcare provider. Document Revised: 08/29/2018 Document Reviewed: 08/29/2018 Elsevier Patient Education  [place].

## 2020-09-04 LAB — COMPLETE METABOLIC PANEL WITH GFR
AG Ratio: 1.5 (calc) (ref 1.0–2.5)
ALT: 18 U/L (ref 6–29)
AST: 22 U/L (ref 10–35)
Albumin: 4.4 g/dL (ref 3.6–5.1)
Alkaline phosphatase (APISO): 66 U/L (ref 37–153)
BUN/Creatinine Ratio: 22 (calc) (ref 6–22)
BUN: 12 mg/dL (ref 7–25)
CO2: 29 mmol/L (ref 20–32)
Calcium: 9.8 mg/dL (ref 8.6–10.4)
Chloride: 100 mmol/L (ref 98–110)
Creat: 0.54 mg/dL — ABNORMAL LOW (ref 0.60–1.00)
Globulin: 2.9 g/dL (calc) (ref 1.9–3.7)
Glucose, Bld: 83 mg/dL (ref 65–99)
Potassium: 4.1 mmol/L (ref 3.5–5.3)
Sodium: 138 mmol/L (ref 135–146)
Total Bilirubin: 0.4 mg/dL (ref 0.2–1.2)
Total Protein: 7.3 g/dL (ref 6.1–8.1)
eGFR: 97 mL/min/{1.73_m2} (ref >=60)

## 2020-09-04 LAB — CBC WITH DIFFERENTIAL/PLATELET
Absolute Monocytes: 673 cells/uL (ref 200–950)
Basophils Absolute: 67 cells/uL (ref 0–200)
Basophils Relative: 0.9 %
Eosinophils Absolute: 340 cells/uL (ref 15–500)
Eosinophils Relative: 4.6 %
HCT: 41.1 % (ref 35.0–45.0)
Hemoglobin: 13.2 g/dL (ref 11.7–15.5)
Lymphs Abs: 1968 cells/uL (ref 850–3900)
MCH: 29.5 pg (ref 27.0–33.0)
MCHC: 32.1 g/dL (ref 32.0–36.0)
MCV: 91.7 fL (ref 80.0–100.0)
MPV: 12.2 fL (ref 7.5–12.5)
Monocytes Relative: 9.1 %
Neutro Abs: 4351 cells/uL (ref 1500–7800)
Neutrophils Relative %: 58.8 %
Platelets: 268 10*3/uL (ref 140–400)
RBC: 4.48 10*6/uL (ref 3.80–5.10)
RDW: 12.4 % (ref 11.0–15.0)
Total Lymphocyte: 26.6 %
WBC: 7.4 10*3/uL (ref 3.8–10.8)

## 2020-09-04 LAB — LIPID PANEL
Cholesterol: 215 mg/dL — ABNORMAL HIGH (ref <200)
HDL: 71 mg/dL (ref >=50)
LDL Cholesterol (Calc): 116 mg/dL (calc) — ABNORMAL HIGH
Non-HDL Cholesterol (Calc): 144 mg/dL (calc) — ABNORMAL HIGH (ref <130)
Total CHOL/HDL Ratio: 3 (calc) (ref <5.0)
Triglycerides: 168 mg/dL — ABNORMAL HIGH (ref <150)

## 2020-09-04 LAB — MAGNESIUM: Magnesium: 2.3 mg/dL (ref 1.5–2.5)

## 2020-09-04 LAB — TSH: TSH: 3.98 mIU/L (ref 0.40–4.50)

## 2020-09-16 ENCOUNTER — Other Ambulatory Visit: Payer: Self-pay | Admitting: Adult Health

## 2020-09-16 DIAGNOSIS — Z1231 Encounter for screening mammogram for malignant neoplasm of breast: Secondary | ICD-10-CM

## 2020-09-25 DIAGNOSIS — D485 Neoplasm of uncertain behavior of skin: Secondary | ICD-10-CM | POA: Diagnosis not present

## 2020-09-25 DIAGNOSIS — Z85828 Personal history of other malignant neoplasm of skin: Secondary | ICD-10-CM | POA: Diagnosis not present

## 2020-09-25 DIAGNOSIS — L814 Other melanin hyperpigmentation: Secondary | ICD-10-CM | POA: Diagnosis not present

## 2020-09-25 DIAGNOSIS — D2371 Other benign neoplasm of skin of right lower limb, including hip: Secondary | ICD-10-CM | POA: Diagnosis not present

## 2020-09-25 DIAGNOSIS — D1800 Hemangioma unspecified site: Secondary | ICD-10-CM | POA: Diagnosis not present

## 2020-09-25 DIAGNOSIS — Z808 Family history of malignant neoplasm of other organs or systems: Secondary | ICD-10-CM | POA: Diagnosis not present

## 2020-09-25 DIAGNOSIS — L821 Other seborrheic keratosis: Secondary | ICD-10-CM | POA: Diagnosis not present

## 2020-09-25 DIAGNOSIS — D2272 Melanocytic nevi of left lower limb, including hip: Secondary | ICD-10-CM | POA: Diagnosis not present

## 2020-09-25 DIAGNOSIS — D1721 Benign lipomatous neoplasm of skin and subcutaneous tissue of right arm: Secondary | ICD-10-CM | POA: Diagnosis not present

## 2020-09-25 DIAGNOSIS — D2262 Melanocytic nevi of left upper limb, including shoulder: Secondary | ICD-10-CM | POA: Diagnosis not present

## 2020-09-25 DIAGNOSIS — D2372 Other benign neoplasm of skin of left lower limb, including hip: Secondary | ICD-10-CM | POA: Diagnosis not present

## 2020-10-08 ENCOUNTER — Ambulatory Visit: Payer: Medicare PPO | Admitting: Adult Health

## 2020-10-09 ENCOUNTER — Ambulatory Visit
Admission: RE | Admit: 2020-10-09 | Discharge: 2020-10-09 | Disposition: A | Discharge status: Home / Self Care | Payer: No Typology Code available for payment source | Source: Ambulatory Visit | Attending: Adult Health | Admitting: Adult Health

## 2020-10-09 DIAGNOSIS — Z8249 Family history of ischemic heart disease and other diseases of the circulatory system: Secondary | ICD-10-CM

## 2020-10-09 DIAGNOSIS — Z136 Encounter for screening for cardiovascular disorders: Secondary | ICD-10-CM

## 2020-10-10 ENCOUNTER — Encounter: Payer: Self-pay | Admitting: Adult Health

## 2020-10-10 ENCOUNTER — Other Ambulatory Visit: Payer: Self-pay | Admitting: Internal Medicine

## 2020-10-10 ENCOUNTER — Other Ambulatory Visit: Payer: Self-pay | Admitting: Adult Health

## 2020-10-10 DIAGNOSIS — I700 Atherosclerosis of aorta: Secondary | ICD-10-CM | POA: Insufficient documentation

## 2020-10-10 DIAGNOSIS — R9389 Abnormal findings on diagnostic imaging of other specified body structures: Secondary | ICD-10-CM

## 2020-10-10 DIAGNOSIS — R918 Other nonspecific abnormal finding of lung field: Secondary | ICD-10-CM

## 2020-10-10 DIAGNOSIS — I2584 Coronary atherosclerosis due to calcified coronary lesion: Secondary | ICD-10-CM | POA: Insufficient documentation

## 2020-10-10 DIAGNOSIS — I2510 Atherosclerotic heart disease of native coronary artery without angina pectoris: Secondary | ICD-10-CM | POA: Insufficient documentation

## 2020-10-10 DIAGNOSIS — E782 Mixed hyperlipidemia: Secondary | ICD-10-CM

## 2020-10-10 MED ORDER — ROSUVASTATIN CALCIUM 10 MG PO TABS: ORAL_TABLET | ORAL | 3 refills | Status: DC

## 2020-10-18 ENCOUNTER — Other Ambulatory Visit: Payer: Self-pay | Admitting: Adult Health

## 2020-10-18 DIAGNOSIS — M810 Age-related osteoporosis without current pathological fracture: Secondary | ICD-10-CM

## 2020-10-20 NOTE — Progress Notes (Signed)
Assessment and Plan:  Katie Morton was seen today for follow-up and results.  Diagnoses and all orders for this visit:  Labile hypertension Labile but typically well controlled per home log Monitor blood pressure at home; call if consistently over 130/80 Continue DASH diet.   Reminder to go to the ER if any CP, SOB, nausea, dizziness, severe HA, changes vision/speech, left arm numbness and tingling and jaw pain.  Coronary artery calcification - LAD CCS 43.4 09/2020 Discussed risks/benefits; reviewed lifestyle and family hx She has started on rosuvastatin; reviewed LDL goal <70 Recheck lipids next OV in Nov  Hyperlipidemia, mixed -     rosuvastatin (CRESTOR) 5 MG tablet; Take 1 tablet Daily for Cholesterol  Abnormal CT chest With remote hx of smoking, she is agreeable to schedule 3 month follow up CT for stability as reccommended by radiologist per guidelines. Order in system, will schedule for 01/10/2021 or soon after. She denies concerning sx today.   Further disposition pending results of labs. Discussed med's effects and SE's.   Over 30 minutes of exam, counseling, chart review, and critical decision making was performed.   Future Appointments  Date Time Provider [place]  10/30/2020  2:15 PM [facility]  01/14/2021  2:30 PM Liane Comber, NP [facility] None  03/25/2021  1:30 PM [facility] MM 3 [facility]  03/25/2021  2:00 PM [facility] DX DEXA 1 [facility]  06/02/2021  2:00 PM Unk Pinto, MD [facility] None    ------------------------------------------------------------------------------------------------------------------   HPI BP 138/70   Pulse 66   Temp (!) 97.3 F (36.3 C)   Wt 115 lb (52.2 kg)   SpO2 99%   BMI 17.75 kg/m  75 y.o.female presents for follow up on htn and to discuss recent coronary calcium CT results.   She has hx of mild persistent hyperlipidemia, has been resistant to starting medications for  this but was interested in persuing CT coronary calcium at last OV.   This was completed 10/09/2020  Left Main: 0 LAD: 43.4 LCx: 0 RCA: 0   Total Agatston Score: 43.4 MESA database percentile: 53 Aortic Atherosclerosis (ICD10-I70.0).  Also showed scattered nodular densities throughout the visualized lungs. Many minimally changed since 2012 and compatible with benign nodules. However, there is a slightly irregular density in the right middle lobe with a maximum dimension of 1.0 cm. This irregular density could be related to scarring based on the morphology but it is indeterminate and recommend follow-up to ensure stability. Consider 3 months for both low-risk and high-risk individuals. This recommendation follows the consensus statement: Guidelines for Management of Incidental Pulmonary Nodules Detected on CT Images: From the Fleischner Society 2017; Radiology 2017; 284:228-243.  She notes remote hx of smoking 40 years ago, is agreeable to pursuing follow up CT scan after discussion of guidelines, risks and benefits.   Reviewed results; she does have family hx of CVD. She reports has started on rosuvastatin 1/2 tab of 10 mg that was previously prescribed, taking daily and tolerating well. Plan to continue for LDL goal <70 though discussed some benefit with pursuing <55 for minimizing risk. Reviewed low saturated fat high fiber diet.  Lab Results  Component Value Date   CHOL 215 (H) 09/03/2020   HDL 71 09/03/2020   LDLCALC 116 (H) 09/03/2020   TRIG 168 (H) 09/03/2020   CHOLHDL 3.0 09/03/2020   Her blood pressure has been controlled at home, (labile, ranges 110-140s/60-70s), today their BP is BP: 138/70  She does workout. She  denies chest pain, shortness of breath, dizziness.  Family History:  Herfamily history includes CAD in her maternal grandfather and paternal grandfather; COPD in her father; Cancer in her maternal aunt and paternal aunt; Cancer - Other in her father; Diabetes in her  mother; Heart disease in her brother and mother; Hypertension in her mother; Mitral valve prolapse in her mother; Stroke in her father. Social History:   reports that she quit smoking about 42 years ago. Her smoking use included cigarettes. She has never used smokeless tobacco. She reports current alcohol use of about 14.0 standard drinks per week. She reports that she does not use drugs.    Past Medical History:  Diagnosis Date   Arthritis    Cancer ([place]) 2016   squamous cell skin cancer    COPD (chronic obstructive pulmonary disease) (HCC)    Elevated hemoglobin A1c    Femur fracture (HCC) 09/20/2012   Hyperlipidemia    Labile hypertension    Osteoporosis    Villous adenoma 2008     Allergies  Allergen Reactions   Brimonidine Itching   Silver Sulfadiazine     Current Outpatient Medications on File Prior to Visit  Medication Sig   alendronate (FOSAMAX) 70 MG tablet TAKE 1 TABLET ONCE WEEKLY ON AN EMPTY STOMACH WITH A FULL GLASS OF WATER. DONT LIE DOWN AFTER TAKING   Ascorbic Acid (VITAMIN C PO) Take 1 tablet by mouth daily.   aspirin EC 81 MG tablet Take 81 mg by mouth daily.   Calcium Citrate-Vitamin D (CALCIUM CITRATE + D PO) Take by mouth daily.   Cholecalciferol (VITAMIN D PO) Take 2,000 Units by mouth daily.   CINNAMON PO Take 1,000 mg by mouth 2 (two) times daily.   dorzolamide (TRUSOPT) 2 % ophthalmic solution 3 (three) times daily.   fish oil-omega-3 fatty acids 1000 MG capsule Take 1 g by mouth daily.    Flaxseed, Linseed, (FLAXSEED OIL) 1000 MG CAPS Take 1,000 mg by mouth daily.   levothyroxine (SYNTHROID) 50 MCG tablet TAKE 1/2 TABLET DAILY ON AN EMPTY STOMACH FOR 30 MIN   Misc Natural Products (OSTEO BI-FLEX JOINT SHIELD PO) Take 1 tablet by mouth 2 (two) times daily.   Multiple Vitamins-Minerals (MULTIVITAMIN WITH MINERALS) tablet Take 1 tablet by mouth daily.   Multiple Vitamins-Minerals (PRESERVISION AREDS PO) Take 1 tablet by mouth daily.    Netarsudil-Latanoprost (ROCKLATAN) 0.02-0.005 % SOLN Place 1 drop into both eyes daily.   OVER THE COUNTER MEDICATION 2 capsules daily. Tumeric w/ Black pepper extract   ezetimibe (ZETIA) 10 MG tablet Take  1 tablet  Daily  for  Cholesterol (Patient not taking: No sig reported)   No current facility-administered medications on file prior to visit.     ROS: all negative except above.   Physical Exam:  BP 138/70   Pulse 66   Temp (!) 97.3 F (36.3 C)   Wt 115 lb (52.2 kg)   SpO2 99%   BMI 17.75 kg/m   General Appearance: Well developed thin elder female in no apparent distress. Eyes: PERRLA, conjunctiva no swelling or erythema ENT/Mouth: Mask in place; Hearing normal.  Neck: Supple Respiratory: Respiratory effort normal, BS equal bilaterally without rales, rhonchi, wheezing or stridor.  Cardio: RRR with no MRGs. Brisk peripheral pulses without edema.  Abdomen: Soft, + BS.  Non tender Musculoskeletal: normal gait.  Skin: Warm, dry without rashes, lesions, ecchymosis.  Neuro: Normal muscle tone Psych: Awake and oriented X 3, normal affect, Insight and Judgment appropriate.  Izora Ribas, NP 11:29 AM [facility]

## 2020-10-21 ENCOUNTER — Other Ambulatory Visit: Payer: Self-pay

## 2020-10-21 ENCOUNTER — Encounter: Payer: Self-pay | Admitting: Adult Health

## 2020-10-21 ENCOUNTER — Ambulatory Visit: Payer: Medicare PPO | Admitting: Adult Health

## 2020-10-21 VITALS — BP 138/70 | HR 66 | Temp 97.3°F | Wt 115.0 lb

## 2020-10-21 DIAGNOSIS — R9389 Abnormal findings on diagnostic imaging of other specified body structures: Secondary | ICD-10-CM

## 2020-10-21 DIAGNOSIS — I2584 Coronary atherosclerosis due to calcified coronary lesion: Secondary | ICD-10-CM

## 2020-10-21 DIAGNOSIS — E782 Mixed hyperlipidemia: Secondary | ICD-10-CM

## 2020-10-21 DIAGNOSIS — I2510 Atherosclerotic heart disease of native coronary artery without angina pectoris: Secondary | ICD-10-CM | POA: Diagnosis not present

## 2020-10-21 DIAGNOSIS — R0989 Other specified symptoms and signs involving the circulatory and respiratory systems: Secondary | ICD-10-CM | POA: Diagnosis not present

## 2020-10-21 MED ORDER — ROSUVASTATIN CALCIUM 5 MG PO TABS: ORAL_TABLET | ORAL | 3 refills | Status: DC

## 2020-10-29 DIAGNOSIS — H401131 Primary open-angle glaucoma, bilateral, mild stage: Secondary | ICD-10-CM | POA: Diagnosis not present

## 2020-10-30 ENCOUNTER — Ambulatory Visit: Payer: Medicare PPO

## 2020-11-10 DIAGNOSIS — H25813 Combined forms of age-related cataract, bilateral: Secondary | ICD-10-CM | POA: Diagnosis not present

## 2020-11-10 DIAGNOSIS — H401221 Low-tension glaucoma, left eye, mild stage: Secondary | ICD-10-CM | POA: Diagnosis not present

## 2020-11-10 DIAGNOSIS — H401213 Low-tension glaucoma, right eye, severe stage: Secondary | ICD-10-CM | POA: Diagnosis not present

## 2021-01-13 ENCOUNTER — Other Ambulatory Visit: Payer: Medicare PPO

## 2021-01-14 ENCOUNTER — Ambulatory Visit: Payer: Medicare PPO | Admitting: Adult Health

## 2021-01-26 NOTE — Progress Notes (Signed)
3 MONTH FOLLOW UP  Assessment:    Hypothyroidism, unspecified hypothyroidism type -cont levothyroxine - TSH   Labile hypertension -cont monitoring -currently managed by diet and exericse - goal <130/80 - TSH   Hyperlipidemia - cardiac calcium score was  53% 10/09/20 has been taking Crestor 5 mg daily -diet and exercise , high fiber discussed -check lipid panel - low risk, start statin if LDL trending 130+  Medication management - CBC with Differential/Platelet - CMP/GFR  Chronic obstructive pulmonary disease, unspecified COPD type ([place]) -former smoker, no active sx, cont to monitor -no current treatment needed  Lung Nodules/ ABNORMAL CT OF CHEST CT of chest is scheduled in 2 days for follow up  Osteoporosis -back on fosamax 11/2018  -Bone Density scan in 2 years, scheduled 03/25/21  Abnormal Glucose -check a1c  -diet and exercise as tolerated   Vitamin D deficiency At goal at recent check; continue to recommend supplementation for goal of 70-100 Defer vitamin D level  Personal history of colon polyps Scheduled for repeat colonoscopy 02/10/21 with [facility]   BMI less than 19,adult -exercise as tolerated -cont increased calorie load  Need for prophylactic vaccination with Streptococcus pneumoniae and influenza vaccines High Dose flu vaccine given Pneumococcal 20 given   Over 40 minutes of exam, counseling, chart review and critical decision making was performed Future Appointments  Date Time Provider [place]  01/30/2021  1:00 PM [facility] CT 1 [facility]  03/25/2021  1:30 PM [facility] MM 3 [facility]  03/25/2021  2:00 PM [facility] DX DEXA 1 [facility]  06/02/2021  2:00 PM Unk Pinto, MD [facility] None     Subjective:  Katie Morton is a 75 y.o. female who presents for and 3 month follow up.   She has recently returned from 7 weeks in [place] and trip to [place].  Had covid 3 weeks ago but states it was very mild and  has no lingering effects.  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 Has some limited ROM of right thumb, usually not painful.  Follows annually with Dr. Jari Pigg for history of SCC.   Coronary artery calcium scoring was performed 10/09/20 and revealed  1. Coronary calcium score is 43.4 and this is at percentile 53 for patients of the same age, gender and ethnicity. 2.  Aortic Atherosclerosis (ICD10-I70.0). 3. Scattered nodular densities throughout the visualized lungs. Many of these nodular densities have minimally changed since 2012 and compatible with benign nodules. However, there is a slightly irregular density in the right middle lobe with a maximum dimension of 1.0 cm. This irregular density could be related to scarring based on the morphology but it is indeterminate and recommend follow-up to ensure stability. Consider 3 months for both low-risk and high-risk Individuals She has repeat CT scan in 2 days to follow up lung nodules. She has a history of smoking but quit 42 years ago  Recently underwent colonoscopy 02/06/2020 by Dr. Stephanie Acre at [facility] due to 20+ polyps, all beninign but 1 year recall due to number. Next colonoscopy scheduled 02/10/21.  She is following with Dr. Ander Slade at [facility] for glaucoma, low tension. Has not had any surgery but does have black spot in her vision in right eye, left eye compensates  BMI is Body mass index is 18.12 kg/m., she has been working on diet and exercise. Wt Readings from Last 3 Encounters:  01/28/21 117 lb 6.4 oz (53.3 kg)  10/21/20 115 lb (52.2 kg)  09/03/20 115 lb (52.2 kg)    Her blood pressure has been controlled at home (130s/70s), today their BP is BP: (!) 104/50 She does workout. She denies chest pain, shortness of breath, dizziness.   She is on cholesterol medication Crestor 5 mg QD and denies myalgias. Her cholesterol is not at goal. The cholesterol last visit was:   Lab Results  Component  Value Date   CHOL 215 (H) 09/03/2020   HDL 71 09/03/2020   LDLCALC 116 (H) 09/03/2020   TRIG 168 (H) 09/03/2020   CHOLHDL 3.0 09/03/2020    She has been working on diet and exercise for hx of prediabetes recently well controlled, and denies increased appetite, nausea, paresthesia of the feet, polydipsia, polyuria and visual disturbances. Last A1C in the office was:  Lab Results  Component Value Date   HGBA1C 5.7 (H) 06/02/2020   She is on thyroid medication. Her medication was not changed last visit.  Taking 25 mcg daily.  Lab Results  Component Value Date   TSH 3.98 09/03/2020   Last GFR: Lab Results  Component Value Date   GFRNONAA 93 06/02/2020   Patient is on Vitamin D supplement.   Lab Results  Component Value Date   VD25OH 94 06/02/2020      Medication Review: Current Outpatient Medications on File Prior to Visit  Medication Sig Dispense Refill   alendronate (FOSAMAX) 70 MG tablet TAKE 1 TABLET ONCE WEEKLY ON AN EMPTY STOMACH WITH A FULL GLASS OF WATER. DONT LIE DOWN AFTER TAKING 12 tablet 3   Ascorbic Acid (VITAMIN C PO) Take 1 tablet by mouth daily.     aspirin EC 81 MG tablet Take 81 mg by mouth daily.     Calcium Citrate-Vitamin D (CALCIUM CITRATE + D PO) Take by mouth daily.     Cholecalciferol (VITAMIN D PO) Take 2,000 Units by mouth daily.     CINNAMON PO Take 1,000 mg by mouth 2 (two) times daily.     dorzolamide (TRUSOPT) 2 % ophthalmic solution 3 (three) times daily.  11   fish oil-omega-3 fatty acids 1000 MG capsule Take 1 g by mouth daily.      Multiple Vitamins-Minerals (MULTIVITAMIN WITH MINERALS) tablet Take 1 tablet by mouth daily.     Netarsudil-Latanoprost (ROCKLATAN) 0.02-0.005 % SOLN Place 1 drop into both eyes daily.     rosuvastatin (CRESTOR) 5 MG tablet Take 1 tablet Daily for Cholesterol 90 tablet 3   Flaxseed, Linseed, (FLAXSEED OIL) 1000 MG CAPS Take 1,000 mg by mouth daily. (Patient not taking: Reported on 01/28/2021)     No current  facility-administered medications on file prior to visit.    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 FAMILY HISTORY Her family history includes CAD in her maternal grandfather and paternal grandfather; COPD in her father; Cancer in her maternal aunt and paternal aunt; Cancer - Other in her father; Diabetes in her mother; Heart disease in her brother and mother; Hypertension in her mother; Mitral valve prolapse in her mother; Stroke in her father. SOCIAL HISTORY She  reports that she quit smoking about 42 years ago. Her smoking use included cigarettes. She has never used smokeless tobacco. She reports current alcohol use of about 14.0 standard drinks per week. She reports that she does not use drugs.  Immunization History  Administered Date(s) Administered   Influenza, High Dose Seasonal PF 01/02/2013, 01/07/2014, 01/23/2015, 12/01/2015, 01/19/2017, 12/29/2017, 12/20/2018, 01/02/2020   Influenza-Unspecified 12/16/2011, 12/23/2016, 01/23/2017   PFIZER(Purple Top)SARS-COV-2 Vaccination 04/02/2019, 04/27/2019,  12/20/2019   Pneumococcal Conjugate-13 01/07/2014   Pneumococcal Polysaccharide-23 08/07/2015   Pneumococcal-Unspecified 02/23/1995   Td 02/22/2005, 07/28/2016   Zoster, Live 12/16/2011       Review of Systems  Constitutional:  Negative for malaise/fatigue and weight loss.  HENT:  Negative for hearing loss and tinnitus.        Black spot in vision of right eye  Eyes:  Negative for blurred vision and double vision.  Respiratory:  Negative for cough, shortness of breath and wheezing.   Cardiovascular:  Negative for chest pain, palpitations, orthopnea, claudication and leg swelling.  Gastrointestinal:  Negative for abdominal pain, blood in stool, constipation, diarrhea, heartburn, melena, nausea and vomiting.  Genitourinary: Negative.   Musculoskeletal:  Negative for falls, joint pain (right hand pain intermittently, weak grip) and myalgias.  Skin:  Negative for rash.  Neurological:  Negative for dizziness, tingling, sensory change, weakness and headaches.  Endo/Heme/Allergies:  Negative for polydipsia.  Psychiatric/Behavioral:  Negative for depression. The patient is not nervous/anxious and does not have insomnia.   All other systems reviewed and are negative.   Objective:     Today's Vitals   01/28/21 1558  BP: (!) 104/50  Pulse: (!) 56  Temp: 97.7 F (36.5 C)  SpO2: 97%  Weight: 117 lb 6.4 oz (53.3 kg)   Body mass index is 18.12 kg/m.  General appearance: alert, no distress, WD/WN, female HEENT: normocephalic, sclerae anicteric, TMs pearly, nares patent, no discharge or erythema, pharynx normal Oral cavity: MMM, no lesions Neck: supple, no lymphadenopathy, no thyromegaly, no masses Heart: RRR, normal S1, S2, no murmurs Lungs: CTA bilaterally, no wheezes, rhonchi, or rales Abdomen: +bs, soft, non tender, non distended, no masses, no hepatomegaly, no splenomegaly Musculoskeletal: nontender, no swelling; she has reduced ROM of R 1st digit base with mild contracture   Extremities: no edema, no cyanosis, no clubbing Pulses: 2+ symmetric, upper and lower extremities, normal cap refill Neurological: alert, oriented x 3, CN2-12 intact, strength normal upper extremities and lower extremities, sensation normal throughout, DTRs 2+ throughout, no cerebellar signs, gait normal Psychiatric: normal affect, behavior normal, pleasant    Magda Bernheim, NP   01/28/2021

## 2021-01-28 ENCOUNTER — Encounter: Payer: Self-pay | Admitting: Nurse Practitioner

## 2021-01-28 ENCOUNTER — Ambulatory Visit: Payer: Medicare PPO | Admitting: Nurse Practitioner

## 2021-01-28 ENCOUNTER — Other Ambulatory Visit: Payer: Self-pay

## 2021-01-28 VITALS — BP 104/50 | HR 56 | Temp 97.7°F | Wt 117.4 lb

## 2021-01-28 DIAGNOSIS — Z681 Body mass index (BMI) 19 or less, adult: Secondary | ICD-10-CM

## 2021-01-28 DIAGNOSIS — R7309 Other abnormal glucose: Secondary | ICD-10-CM

## 2021-01-28 DIAGNOSIS — Z86010 Personal history of colonic polyps: Secondary | ICD-10-CM | POA: Diagnosis not present

## 2021-01-28 DIAGNOSIS — R0989 Other specified symptoms and signs involving the circulatory and respiratory systems: Secondary | ICD-10-CM

## 2021-01-28 DIAGNOSIS — R9389 Abnormal findings on diagnostic imaging of other specified body structures: Secondary | ICD-10-CM

## 2021-01-28 DIAGNOSIS — E039 Hypothyroidism, unspecified: Secondary | ICD-10-CM

## 2021-01-28 DIAGNOSIS — J449 Chronic obstructive pulmonary disease, unspecified: Secondary | ICD-10-CM

## 2021-01-28 DIAGNOSIS — E782 Mixed hyperlipidemia: Secondary | ICD-10-CM | POA: Diagnosis not present

## 2021-01-28 DIAGNOSIS — Z23 Encounter for immunization: Secondary | ICD-10-CM

## 2021-01-28 DIAGNOSIS — E46 Unspecified protein-calorie malnutrition: Secondary | ICD-10-CM | POA: Diagnosis not present

## 2021-01-28 DIAGNOSIS — M810 Age-related osteoporosis without current pathological fracture: Secondary | ICD-10-CM

## 2021-01-28 DIAGNOSIS — R918 Other nonspecific abnormal finding of lung field: Secondary | ICD-10-CM

## 2021-01-28 MED ORDER — LEVOTHYROXINE SODIUM 50 MCG PO TABS: ORAL_TABLET | ORAL | 3 refills | Status: DC

## 2021-01-28 NOTE — Patient Instructions (Signed)

## 2021-01-29 LAB — HEMOGLOBIN A1C
Hgb A1c MFr Bld: 5.8 % of total Hgb — ABNORMAL HIGH (ref <5.7)
Mean Plasma Glucose: 120 mg/dL
eAG (mmol/L): 6.6 mmol/L

## 2021-01-29 LAB — LIPID PANEL
Cholesterol: 168 mg/dL (ref <200)
HDL: 63 mg/dL (ref >=50)
LDL Cholesterol (Calc): 80 mg/dL (calc)
Non-HDL Cholesterol (Calc): 105 mg/dL (calc) (ref <130)
Total CHOL/HDL Ratio: 2.7 (calc) (ref <5.0)
Triglycerides: 153 mg/dL — ABNORMAL HIGH (ref <150)

## 2021-01-29 LAB — COMPLETE METABOLIC PANEL WITH GFR
AG Ratio: 1.4 (calc) (ref 1.0–2.5)
ALT: 22 U/L (ref 6–29)
AST: 21 U/L (ref 10–35)
Albumin: 4.3 g/dL (ref 3.6–5.1)
Alkaline phosphatase (APISO): 66 U/L (ref 37–153)
BUN/Creatinine Ratio: 24 (calc) — ABNORMAL HIGH (ref 6–22)
BUN: 13 mg/dL (ref 7–25)
CO2: 28 mmol/L (ref 20–32)
Calcium: 9.8 mg/dL (ref 8.6–10.4)
Chloride: 102 mmol/L (ref 98–110)
Creat: 0.54 mg/dL — ABNORMAL LOW (ref 0.60–1.00)
Globulin: 3 g/dL (calc) (ref 1.9–3.7)
Glucose, Bld: 70 mg/dL (ref 65–99)
Potassium: 4 mmol/L (ref 3.5–5.3)
Sodium: 140 mmol/L (ref 135–146)
Total Bilirubin: 0.5 mg/dL (ref 0.2–1.2)
Total Protein: 7.3 g/dL (ref 6.1–8.1)
eGFR: 97 mL/min/{1.73_m2} (ref >=60)

## 2021-01-29 LAB — CBC WITH DIFFERENTIAL/PLATELET
Absolute Monocytes: 756 cells/uL (ref 200–950)
Basophils Absolute: 90 cells/uL (ref 0–200)
Basophils Relative: 1 %
Eosinophils Absolute: 432 cells/uL (ref 15–500)
Eosinophils Relative: 4.8 %
HCT: 39.6 % (ref 35.0–45.0)
Hemoglobin: 13.3 g/dL (ref 11.7–15.5)
Lymphs Abs: 2718 cells/uL (ref 850–3900)
MCH: 30.9 pg (ref 27.0–33.0)
MCHC: 33.6 g/dL (ref 32.0–36.0)
MCV: 92.1 fL (ref 80.0–100.0)
MPV: 12.4 fL (ref 7.5–12.5)
Monocytes Relative: 8.4 %
Neutro Abs: 5004 cells/uL (ref 1500–7800)
Neutrophils Relative %: 55.6 %
Platelets: 277 10*3/uL (ref 140–400)
RBC: 4.3 10*6/uL (ref 3.80–5.10)
RDW: 11.9 % (ref 11.0–15.0)
Total Lymphocyte: 30.2 %
WBC: 9 10*3/uL (ref 3.8–10.8)

## 2021-01-29 LAB — TSH: TSH: 3.61 mIU/L (ref 0.40–4.50)

## 2021-01-30 ENCOUNTER — Other Ambulatory Visit: Payer: Self-pay

## 2021-01-30 ENCOUNTER — Ambulatory Visit
Admission: RE | Admit: 2021-01-30 | Discharge: 2021-01-30 | Disposition: A | Discharge status: Home / Self Care | Payer: Medicare PPO | Source: Ambulatory Visit | Attending: Adult Health | Admitting: Adult Health

## 2021-01-30 DIAGNOSIS — R918 Other nonspecific abnormal finding of lung field: Secondary | ICD-10-CM

## 2021-01-30 DIAGNOSIS — R9389 Abnormal findings on diagnostic imaging of other specified body structures: Secondary | ICD-10-CM

## 2021-01-30 DIAGNOSIS — I700 Atherosclerosis of aorta: Secondary | ICD-10-CM | POA: Diagnosis not present

## 2021-01-30 DIAGNOSIS — R911 Solitary pulmonary nodule: Secondary | ICD-10-CM | POA: Diagnosis not present

## 2021-02-10 DIAGNOSIS — Z79899 Other long term (current) drug therapy: Secondary | ICD-10-CM | POA: Diagnosis not present

## 2021-02-10 DIAGNOSIS — D123 Benign neoplasm of transverse colon: Secondary | ICD-10-CM | POA: Diagnosis not present

## 2021-02-10 DIAGNOSIS — Z86010 Personal history of colonic polyps: Secondary | ICD-10-CM | POA: Diagnosis not present

## 2021-02-10 DIAGNOSIS — J449 Chronic obstructive pulmonary disease, unspecified: Secondary | ICD-10-CM | POA: Diagnosis not present

## 2021-02-10 DIAGNOSIS — E039 Hypothyroidism, unspecified: Secondary | ICD-10-CM | POA: Diagnosis not present

## 2021-02-10 DIAGNOSIS — Z1211 Encounter for screening for malignant neoplasm of colon: Secondary | ICD-10-CM | POA: Diagnosis not present

## 2021-02-10 DIAGNOSIS — D120 Benign neoplasm of cecum: Secondary | ICD-10-CM | POA: Diagnosis not present

## 2021-02-10 DIAGNOSIS — Z79890 Hormone replacement therapy (postmenopausal): Secondary | ICD-10-CM | POA: Diagnosis not present

## 2021-02-10 DIAGNOSIS — K635 Polyp of colon: Secondary | ICD-10-CM | POA: Diagnosis not present

## 2021-02-11 LAB — HM COLONOSCOPY

## 2021-03-12 ENCOUNTER — Ambulatory Visit: Payer: Medicare PPO

## 2021-03-12 ENCOUNTER — Other Ambulatory Visit: Payer: Medicare PPO

## 2021-03-18 DIAGNOSIS — H401213 Low-tension glaucoma, right eye, severe stage: Secondary | ICD-10-CM | POA: Diagnosis not present

## 2021-03-18 DIAGNOSIS — H401221 Low-tension glaucoma, left eye, mild stage: Secondary | ICD-10-CM | POA: Diagnosis not present

## 2021-03-25 ENCOUNTER — Ambulatory Visit
Admission: RE | Admit: 2021-03-25 | Discharge: 2021-03-25 | Disposition: A | Discharge status: Home / Self Care | Payer: Medicare PPO | Source: Ambulatory Visit | Attending: Adult Health | Admitting: Adult Health

## 2021-03-25 ENCOUNTER — Other Ambulatory Visit: Payer: Self-pay | Admitting: Adult Health

## 2021-03-25 DIAGNOSIS — M810 Age-related osteoporosis without current pathological fracture: Secondary | ICD-10-CM

## 2021-03-25 DIAGNOSIS — Z1231 Encounter for screening mammogram for malignant neoplasm of breast: Secondary | ICD-10-CM

## 2021-03-25 DIAGNOSIS — M85851 Other specified disorders of bone density and structure, right thigh: Secondary | ICD-10-CM | POA: Diagnosis not present

## 2021-03-25 DIAGNOSIS — Z780 Asymptomatic menopausal state: Secondary | ICD-10-CM | POA: Diagnosis not present

## 2021-04-01 DIAGNOSIS — H401221 Low-tension glaucoma, left eye, mild stage: Secondary | ICD-10-CM | POA: Diagnosis not present

## 2021-04-01 DIAGNOSIS — H25813 Combined forms of age-related cataract, bilateral: Secondary | ICD-10-CM | POA: Diagnosis not present

## 2021-04-01 DIAGNOSIS — H401213 Low-tension glaucoma, right eye, severe stage: Secondary | ICD-10-CM | POA: Diagnosis not present

## 2021-04-01 DIAGNOSIS — H5340 Unspecified visual field defects: Secondary | ICD-10-CM | POA: Diagnosis not present

## 2021-04-09 ENCOUNTER — Encounter: Payer: Self-pay | Admitting: Internal Medicine

## 2021-04-21 DIAGNOSIS — L570 Actinic keratosis: Secondary | ICD-10-CM | POA: Diagnosis not present

## 2021-04-21 DIAGNOSIS — I781 Nevus, non-neoplastic: Secondary | ICD-10-CM | POA: Diagnosis not present

## 2021-04-21 DIAGNOSIS — L821 Other seborrheic keratosis: Secondary | ICD-10-CM | POA: Diagnosis not present

## 2021-04-24 DIAGNOSIS — H401213 Low-tension glaucoma, right eye, severe stage: Secondary | ICD-10-CM | POA: Diagnosis not present

## 2021-04-24 DIAGNOSIS — H5340 Unspecified visual field defects: Secondary | ICD-10-CM | POA: Diagnosis not present

## 2021-05-06 DIAGNOSIS — E039 Hypothyroidism, unspecified: Secondary | ICD-10-CM | POA: Diagnosis not present

## 2021-05-06 DIAGNOSIS — E559 Vitamin D deficiency, unspecified: Secondary | ICD-10-CM | POA: Diagnosis not present

## 2021-05-06 DIAGNOSIS — M810 Age-related osteoporosis without current pathological fracture: Secondary | ICD-10-CM | POA: Diagnosis not present

## 2021-05-13 DIAGNOSIS — Z79890 Hormone replacement therapy (postmenopausal): Secondary | ICD-10-CM | POA: Diagnosis not present

## 2021-05-13 DIAGNOSIS — Z87891 Personal history of nicotine dependence: Secondary | ICD-10-CM | POA: Diagnosis not present

## 2021-05-13 DIAGNOSIS — M1990 Unspecified osteoarthritis, unspecified site: Secondary | ICD-10-CM | POA: Diagnosis not present

## 2021-05-13 DIAGNOSIS — Z79899 Other long term (current) drug therapy: Secondary | ICD-10-CM | POA: Diagnosis not present

## 2021-05-13 DIAGNOSIS — J449 Chronic obstructive pulmonary disease, unspecified: Secondary | ICD-10-CM | POA: Diagnosis not present

## 2021-05-13 DIAGNOSIS — H401113 Primary open-angle glaucoma, right eye, severe stage: Secondary | ICD-10-CM | POA: Diagnosis not present

## 2021-05-13 DIAGNOSIS — Z7982 Long term (current) use of aspirin: Secondary | ICD-10-CM | POA: Diagnosis not present

## 2021-05-13 DIAGNOSIS — E039 Hypothyroidism, unspecified: Secondary | ICD-10-CM | POA: Diagnosis not present

## 2021-05-27 DIAGNOSIS — Z01419 Encounter for gynecological examination (general) (routine) without abnormal findings: Secondary | ICD-10-CM | POA: Diagnosis not present

## 2021-06-01 NOTE — Progress Notes (Signed)
? ?Annual Screening/Preventative Visit ?& Comprehensive Evaluation &  Examination ? ?Future Appointments  ?Date Time Provider Department  ?06/02/2021  2:00 PM Unk Pinto, MD [facility]  ?06/07/2022  2:00 PM Unk Pinto, MD [facility]  ? ? ?    This very nice 76 y.o. MWF presents for a Screening /Preventative Visit & comprehensive evaluation and management of multiple medical co-morbidities.  Patient has been followed for HTN, HLD, Hypothyroidism,  Prediabetes  and Vitamin D Deficiency. Chest CT in Aug  & Dec 2022 showed Coronary artery calcification & Aortic Atherosclerosis.  Pulmonary nodule also monitored appeared stable over the last 10 years since 2012.  ? ? ?     Patient has hx/o Osteoporosis and had been off of Fosamax from 2019 until Oct 2020 when f/u  dexaBMD   showed worsening with T -3.3 and Fosamax was restarted & now after  BMD 03/25/2021  Fosamax was  recently stopped by Dr Suzette Battiest. Patient was apparently recommended Prolia, but no records received   ? ? ?     HTN predates since  2012 .  CT  Coronary artery calcium score was 43.4  at percentile 53  in August 2022. Patient's BP has been controlled at home and patient denies any cardiac symptoms as chest pain, palpitations, shortness of breath, dizziness or ankle swelling. Today's BP is at goal  - 140/70.  ? ? ?    Patient's hyperlipidemia is controlled with diet and Rosuvastatin. Patient denies myalgias or other medication SE's. Last lipids were at goal : ? ?Lab Results  ?Component Value Date  ? CHOL 168 01/28/2021  ? HDL 63 01/28/2021  ? [place] 80 01/28/2021  ? TRIG 153 (H) 01/28/2021  ? CHOLHDL 2.7 01/28/2021  ? ? ? ?    Patient has hx/o prediabetes  (A1c 5.7% /2012 & 6.0% /2016).  Patient denies reactive hypoglycemic symptoms, visual blurring, diabetic polys or paresthesias. Last A1c was near goal : ? ?Lab Results  ?Component Value Date  ? HGBA1C 5.8 (H) 01/28/2021  ? ? ?  ?                                       Patient was dx'd Hypothyroid   in 2015 & was started on Thyroid Replacement. ?  ?  ? ?    Finally, patient has history of Vitamin D Deficiency and last Vitamin D was at goal : ? ?Lab Results  ?Component Value Date  ? VD25OH 94 06/02/2020  ? ? ? ?Current Outpatient Medications on File Prior to Visit  ?Medication Sig  ? alendronate 70 MG tablet TAKE 1 TABLET ONCE WEEKLY   ? VITAMIN C  Take 1 tablet  daily.  ? aspirin EC 81 MG tablet Take  daily.  ? Calcium Citrate-Vitamin D  Take  daily.  ? VITAMIN D 2,000 Units  Take  daily.  ? CINNAMON   1,000 mg  Take  2  times daily.  ? TRUSOPT) 2 % ophth soln Use 3 times daily.  ? fish oil-omega-3 1000 MG capsule Take daily.   ? levothyroxine 50 MCG tablet TAKE 1/2 TABLET DAILY   ? Multiple Vitamins-Minerals  Take 1 tablet daily.  ? Netarsudil-Latanoprost (ROCKLATAN) 0.02-0.005 % SOLN Place 1 drop into both eyes daily.  ? rosuvastatin  5 MG tablet Take 1 tablet Daily for Cholesterol  ? ? ? ?Allergies  ?Allergen Reactions  ? Brimonidine  Itching  ? Silver Sulfadiazine   ? ? ? ?Past Medical History:  ?Diagnosis Date  ? Arthritis   ? Cancer [facility]) 2016  ? squamous cell skin cancer   ? COPD (chronic obstructive pulmonary disease) ([place])   ? Elevated hemoglobin A1c   ? Femur fracture ([place]) 09/20/2012  ? Hyperlipidemia   ? Labile hypertension   ? Osteoporosis   ? Villous adenoma 2008  ? ? ? ?Health Maintenance  ?Topic Date Due  ? Zoster Vaccines- Shingrix (1 of 2) Never done  ? COVID-19 Vaccine (4 - Booster for Pfizer series) 02/14/2020  ? INFLUENZA VACCINE  09/22/2021  ? MAMMOGRAM  03/26/2023  ? DEXA SCAN  03/26/2023  ? TETANUS/TDAP  07/29/2026  ? Pneumonia Vaccine 79+ Years old  Completed  ? Hepatitis C Screening  Completed  ? HPV VACCINES  Aged Out  ? ? ? ?Immunization History  ?Administered Date(s) Administered  ? Influenza, High Dose  12/29/2017, 12/20/2018, 01/02/2020, 01/28/2021  ? Influenza 12/16/2011, 12/23/2016, 01/23/2017  ? PFIZER SARS-COV-2 Vacc 04/02/2019, 04/27/2019, 12/20/2019  ? PNEUMO CONJUGATE-20  01/28/2021  ? Pneumococcal -13 01/07/2014  ? Pneumococcal -23 08/07/2015  ? Pneumococcal-23 02/23/1995  ? Td 02/22/2005, 07/28/2016  ? Zoster, Live 12/16/2011  ? ? ?Last Colon - 02/10/2021 - Dr Stephanie Acre [facility]  with polyps &  ?                       recommended another 1 year f/u due Dec 2023.  ? ? ?Last MGM - 03/25/2021 ? ? ?Past Surgical History:  ?Procedure Laterality Date  ? HIP PINNING,CANNULATED Left 09/19/2012  ? Percutaneous Screw Fixation Left Hip Fracture;   Nita Sells, MD  ? MANDIBLE SURGERY    ? TONSILLECTOMY    ? ? ? ?Family History  ?Problem Relation Age of Onset  ? Hypertension Mother   ? Diabetes Mother   ? Mitral valve prolapse Mother   ? Heart disease Mother   ? Cancer - Other Father   ?     Leukemia and Melanoma  ? COPD Father   ? Stroke Father   ? CAD Paternal Grandfather   ? CAD Maternal Grandfather   ? Heart disease Brother   ? Cancer Maternal Aunt   ?     colon  ? Cancer Paternal Aunt   ?     colon  ? ? ? ?Social History  ? ?Tobacco Use  ? Smoking status: Former  ?  Types: Cigarettes  ?  Quit date: 02/22/1978  ?  Years since quitting: 43.3  ? Smokeless tobacco: Never  ?Substance Use Topics  ? Alcohol use: Yes  ?  Alcohol/week: 14.0 standard drinks  ?  Types: 14 Standard drinks or equivalent per week  ? Drug use: No  ? ? ? ? ROS ?Constitutional: Denies fever, chills, weight loss/gain, headaches, insomnia,  night sweats, and change in appetite. Does c/o fatigue. ?Eyes: Denies redness, blurred vision, diplopia, discharge, itchy, watery eyes.  ?ENT: Denies discharge, congestion, post nasal drip, epistaxis, sore throat, earache, hearing loss, dental pain, Tinnitus, Vertigo, Sinus pain, snoring.  ?Cardio: Denies chest pain, palpitations, irregular heartbeat, syncope, dyspnea, diaphoresis, orthopnea, PND, claudication, edema ?Respiratory: denies cough, dyspnea, DOE, pleurisy, hoarseness, laryngitis, wheezing.  ?Gastrointestinal: Denies dysphagia, heartburn, reflux, water brash, pain, cramps,  nausea, vomiting, bloating, diarrhea, constipation, hematemesis, melena, hematochezia, jaundice, hemorrhoids ?Genitourinary: Denies dysuria, frequency, urgency, nocturia, hesitancy, discharge, hematuria, flank pain ?Breast: Breast lumps, nipple discharge, bleeding.  ?Musculoskeletal:  Denies arthralgia, myalgia, stiffness, Jt. Swelling, pain, limp, and strain/sprain. Denies falls. ?Skin: Denies puritis, rash, hives, warts, acne, eczema, changing in skin lesion ?Neuro: No weakness, tremor, incoordination, spasms, paresthesia, pain ?Psychiatric: Denies confusion, memory loss, sensory loss. Denies Depression. ?Endocrine: Denies change in weight, skin, hair change, nocturia, and paresthesia, diabetic polys, visual blurring, hyper / hypo glycemic episodes.  ?Heme/Lymph: No excessive bleeding, bruising, enlarged lymph nodes. ? ?Physical Exam ? ?BP 140/70   Pulse 61   Temp 97.9 ?F (36.6 ?C)   Resp 16   Ht 5' 7.5" (1.715 m)   Wt 116 lb 3.2 oz (52.7 kg)   SpO2 97%   BMI 17.93 kg/m?  ? ?General Appearance: Slender,  well groomed and in no apparent distress. ? ?Eyes: PERRLA, EOMs, conjunctiva no swelling or erythema, normal fundi and vessels. ?Sinuses: No frontal/maxillary tenderness ?ENT/Mouth: EACs patent / TMs  nl. Nares clear without erythema, swelling, mucoid exudates. Oral hygiene is good. No erythema, swelling, or exudate. Tongue normal, non-obstructing. Tonsils not swollen or erythematous. Hearing normal.  ?Neck: Supple, thyroid not palpable. No bruits, nodes or JVD. ?Respiratory: Respiratory effort normal.  BS equal and clear bilateral without rales, rhonci, wheezing or stridor. ?Cardio: Heart sounds are normal with regular rate and rhythm and no murmurs, rubs or gallops. Peripheral pulses are normal and equal bilaterally without edema. No aortic or femoral bruits. ?Chest: symmetric with normal excursions and percussion. ?Breasts: Symmetric, without lumps, nipple discharge, retractions, or fibrocystic changes.   ?Abdomen: Flat, soft with bowel sounds active. Nontender, no guarding, rebound, hernias, masses, or organomegaly.  ?Lymphatics: Non tender without lymphadenopathy. \ ?Musculoskeletal: Full ROM all peripheral extre

## 2021-06-02 ENCOUNTER — Encounter: Payer: Self-pay | Admitting: Internal Medicine

## 2021-06-02 ENCOUNTER — Ambulatory Visit: Payer: Medicare PPO | Admitting: Internal Medicine

## 2021-06-02 VITALS — BP 140/70 | HR 61 | Temp 97.9°F | Resp 16 | Ht 67.5 in | Wt 116.2 lb

## 2021-06-02 DIAGNOSIS — E782 Mixed hyperlipidemia: Secondary | ICD-10-CM

## 2021-06-02 DIAGNOSIS — Z87891 Personal history of nicotine dependence: Secondary | ICD-10-CM

## 2021-06-02 DIAGNOSIS — E559 Vitamin D deficiency, unspecified: Secondary | ICD-10-CM

## 2021-06-02 DIAGNOSIS — R7309 Other abnormal glucose: Secondary | ICD-10-CM

## 2021-06-02 DIAGNOSIS — Z79899 Other long term (current) drug therapy: Secondary | ICD-10-CM | POA: Diagnosis not present

## 2021-06-02 DIAGNOSIS — Z0000 Encounter for general adult medical examination without abnormal findings: Secondary | ICD-10-CM

## 2021-06-02 DIAGNOSIS — Z8249 Family history of ischemic heart disease and other diseases of the circulatory system: Secondary | ICD-10-CM

## 2021-06-02 DIAGNOSIS — Z0001 Encounter for general adult medical examination with abnormal findings: Secondary | ICD-10-CM

## 2021-06-02 DIAGNOSIS — I2510 Atherosclerotic heart disease of native coronary artery without angina pectoris: Secondary | ICD-10-CM

## 2021-06-02 DIAGNOSIS — I2584 Coronary atherosclerosis due to calcified coronary lesion: Secondary | ICD-10-CM | POA: Diagnosis not present

## 2021-06-02 DIAGNOSIS — R0989 Other specified symptoms and signs involving the circulatory and respiratory systems: Secondary | ICD-10-CM

## 2021-06-02 DIAGNOSIS — E039 Hypothyroidism, unspecified: Secondary | ICD-10-CM | POA: Diagnosis not present

## 2021-06-02 DIAGNOSIS — Z136 Encounter for screening for cardiovascular disorders: Secondary | ICD-10-CM | POA: Diagnosis not present

## 2021-06-02 DIAGNOSIS — M810 Age-related osteoporosis without current pathological fracture: Secondary | ICD-10-CM

## 2021-06-02 DIAGNOSIS — I700 Atherosclerosis of aorta: Secondary | ICD-10-CM | POA: Diagnosis not present

## 2021-06-02 NOTE — Patient Instructions (Signed)

## 2021-06-03 LAB — LIPID PANEL
Cholesterol: 177 mg/dL (ref <200)
HDL: 76 mg/dL (ref >=50)
LDL Cholesterol (Calc): 83 mg/dL (calc)
Non-HDL Cholesterol (Calc): 101 mg/dL (calc) (ref <130)
Total CHOL/HDL Ratio: 2.3 (calc) (ref <5.0)
Triglycerides: 86 mg/dL (ref <150)

## 2021-06-03 LAB — URINALYSIS, ROUTINE W REFLEX MICROSCOPIC
Bilirubin Urine: NEGATIVE
Glucose, UA: NEGATIVE
Hgb urine dipstick: NEGATIVE
Ketones, ur: NEGATIVE
Leukocytes,Ua: NEGATIVE
Nitrite: NEGATIVE
Protein, ur: NEGATIVE
Specific Gravity, Urine: 1.005 (ref 1.001–1.035)
pH: 6.5 (ref 5.0–8.0)

## 2021-06-03 LAB — COMPLETE METABOLIC PANEL WITH GFR
AG Ratio: 1.6 (calc) (ref 1.0–2.5)
ALT: 18 U/L (ref 6–29)
AST: 18 U/L (ref 10–35)
Albumin: 4.6 g/dL (ref 3.6–5.1)
Alkaline phosphatase (APISO): 62 U/L (ref 37–153)
BUN/Creatinine Ratio: 22 (calc) (ref 6–22)
BUN: 12 mg/dL (ref 7–25)
CO2: 30 mmol/L (ref 20–32)
Calcium: 9.9 mg/dL (ref 8.6–10.4)
Chloride: 102 mmol/L (ref 98–110)
Creat: 0.54 mg/dL — ABNORMAL LOW (ref 0.60–1.00)
Globulin: 2.8 g/dL (calc) (ref 1.9–3.7)
Glucose, Bld: 89 mg/dL (ref 65–99)
Potassium: 4.6 mmol/L (ref 3.5–5.3)
Sodium: 140 mmol/L (ref 135–146)
Total Bilirubin: 0.5 mg/dL (ref 0.2–1.2)
Total Protein: 7.4 g/dL (ref 6.1–8.1)
eGFR: 97 mL/min/{1.73_m2} (ref >=60)

## 2021-06-03 LAB — CBC WITH DIFFERENTIAL/PLATELET
Absolute Monocytes: 533 cells/uL (ref 200–950)
Basophils Absolute: 92 cells/uL (ref 0–200)
Basophils Relative: 1.3 %
Eosinophils Absolute: 320 cells/uL (ref 15–500)
Eosinophils Relative: 4.5 %
HCT: 42 % (ref 35.0–45.0)
Hemoglobin: 13.8 g/dL (ref 11.7–15.5)
Lymphs Abs: 2059 cells/uL (ref 850–3900)
MCH: 29.8 pg (ref 27.0–33.0)
MCHC: 32.9 g/dL (ref 32.0–36.0)
MCV: 90.7 fL (ref 80.0–100.0)
MPV: 12.2 fL (ref 7.5–12.5)
Monocytes Relative: 7.5 %
Neutro Abs: 4097 cells/uL (ref 1500–7800)
Neutrophils Relative %: 57.7 %
Platelets: 269 10*3/uL (ref 140–400)
RBC: 4.63 10*6/uL (ref 3.80–5.10)
RDW: 11.7 % (ref 11.0–15.0)
Total Lymphocyte: 29 %
WBC: 7.1 10*3/uL (ref 3.8–10.8)

## 2021-06-03 LAB — INSULIN, RANDOM: Insulin: 3.8 u[IU]/mL

## 2021-06-03 LAB — TSH: TSH: 3.38 mIU/L (ref 0.40–4.50)

## 2021-06-03 LAB — MICROALBUMIN / CREATININE URINE RATIO
Creatinine, Urine: 14 mg/dL — ABNORMAL LOW (ref 20–275)
Microalb, Ur: <0.2 mg/dL

## 2021-06-03 LAB — HEMOGLOBIN A1C
Hgb A1c MFr Bld: 5.8 % of total Hgb — ABNORMAL HIGH (ref <5.7)
Mean Plasma Glucose: 120 mg/dL
eAG (mmol/L): 6.6 mmol/L

## 2021-06-03 LAB — MAGNESIUM: Magnesium: 2.6 mg/dL — ABNORMAL HIGH (ref 1.5–2.5)

## 2021-06-03 LAB — VITAMIN D 25 HYDROXY (VIT D DEFICIENCY, FRACTURES): Vit D, 25-Hydroxy: 83 ng/mL (ref 30–100)

## 2021-06-03 NOTE — Progress Notes (Signed)
<><><><><><><><><><><><><><><><><><><><><><><><><><><><><><><><><> ?<><><><><><><><><><><><><><><><><><><><><><><><><><><><><><><><><> ?-   Test results slightly outside the reference range are not unusual. ?If there is anything important, I will review this with you,  ?otherwise it is considered normal test values.  ?If you have further questions,  ?please do not hesitate to contact me at the office or via My Chart.  ?<><><><><><><><><><><><><><><><><><><><><><><><><><><><><><><><><> ?<><><><><><><><><><><><><><><><><><><><><><><><><><><><><><><><><> ? ?-  A1c = 5.8% is STILL  elevated in the borderline and  ?early or pre-diabetes range which has the same  ? ?300% increased risk for heart attack, stroke, cancer and  ? ?alzheimer- type vascular dementia as full blown diabetes.  ? ?But the good news is that diet, exercise with weight loss can  ?                                                                       cure the early diabetes at this point. ? ?- Also recommend that you take Cinnamon 1,000 mg capsule  ?                                                                 TWICE /day to help lower blood sugars ?<><><><><><><><><><><><><><><><><><><><><><><><><><><><><><><><><> ?<><><><><><><><><><><><><><><><><><><><><><><><><><><><><><><><><> ? ?-  Total  Chol =    177   - Great  ?           (  Ideal  or  Goal is less than 180  !  )  ?- and  ? ?-  Bad / Dangerous LDL  Chol =  83   ? Excellent  ? ?- Very low risk for Heart Attack  / Stroke ?<><><><><><><><><><><><><><><><><><><><><><><><><><><><><><><><><> ?<><><><><><><><><><><><><><><><><><><><><><><><><><><><><><><><><> ? ?-   Vitamin D = 83  - Excellent - Please keep dose same ?<><><><><><><><><><><><><><><><><><><><><><><><><><><><><><><><><> ?<><><><><><><><><><><><><><><><><><><><><><><><><><><><><><><><><> ? ?-  All Else - CBC - Kidneys - Electrolytes - Liver - Magnesium & Thyroid   ? ?- all  Normal /  OK ?<><><><><><><><><><><><><><><><><><><><><><><><><><><><><><><><><> ?<><><><><><><><><><><><><><><><><><><><><><><><><><><><><><><><><> ? ?-  Keep up the [place] Work  ! ?<><><><><><><><><><><><><><><><><><><><><><><><><><><><><><><><><> ?<><><><><><><><><><><><><><><><><><><><><><><><><><><><><><><><><> ? ? ? ? ? ? ? ? ? ? ? ? ? ? ? ? ? ? ? ? ? ? ? ? ? ? ? ? ? ? ? ? ? ?

## 2021-08-04 NOTE — Progress Notes (Unsigned)
Assessment and Plan: Katie Morton was seen today for acute visit.  Diagnoses and all orders for this visit:  Labile hypertension - controlled without medications, DASH diet, exercise and monitor at home. Call if greater than 130/80.   Seborrheic keratosis Observe, if changes size or color notify the office  Age related osteoporosis, unspecified pathological fracture presence Continue to follow with endocrinology for Prolia infusion, has not started yet due to surgery on eye  Continue weight bearing exercises and Vit D    Further disposition pending results of labs. Discussed med's effects and SE's.   Over 30 minutes of exam, counseling, chart review, and critical decision making was performed.   Future Appointments  Date Time Provider [place]  09/07/2021  2:30 PM Alycia Rossetti, NP [facility] None  12/10/2021  2:30 PM Unk Pinto, MD [facility] None  06/07/2022  2:00 PM Unk Pinto, MD [facility] None    ------------------------------------------------------------------------------------------------------------------   HPI BP 132/76   Pulse (!) 55   Temp (!) 97.5 F (36.4 C)   Wt 114 lb (51.7 kg)   SpO2 95%   BMI 17.59 kg/m   76 y.o.female presents for area on her left arm. She noted for several weeks an area on left forearm that was growing taller than wide and was slightly irritated.  Area fell off this morning in the shower.   She is following with endocrinology for osteoporosis and plan is to start Prolia infusion, she has not yet started as they were waiting for her to recover from eye surgery. She does continue to exercise and takes Vit D and calcium supplements   BP currently well controlled without medication.  Denies headaches, chest Pain and shortness of breath  BP Readings from Last 3 Encounters:  08/05/21 132/76  06/02/21 140/70  01/28/21 (!) 104/50    Past Medical History:  Diagnosis Date   Arthritis    Cancer ([place]) 2016   squamous  cell skin cancer    COPD (chronic obstructive pulmonary disease) ([facility])    Elevated hemoglobin A1c    Femur fracture ([facility]) 09/20/2012   Hyperlipidemia    Labile hypertension    Osteoporosis    Villous adenoma 2008     Allergies  Allergen Reactions   Brimonidine Itching   Silver Sulfadiazine     Current Outpatient Medications on File Prior to Visit  Medication Sig   Ascorbic Acid (VITAMIN C PO) Take 1 tablet by mouth daily.   aspirin EC 81 MG tablet Take 81 mg by mouth daily.   Calcium Citrate-Vitamin D (CALCIUM CITRATE + D PO) Take by mouth daily.   Cholecalciferol (VITAMIN D PO) Take 2,000 Units by mouth daily.   CINNAMON PO Take 1,000 mg by mouth 2 (two) times daily.   dorzolamide (TRUSOPT) 2 % ophthalmic solution 3 (three) times daily.   fish oil-omega-3 fatty acids 1000 MG capsule Take 1 g by mouth daily.    levothyroxine (SYNTHROID) 50 MCG tablet TAKE 1/2 TABLET DAILY ON AN EMPTY STOMACH FOR 30 MIN   Multiple Vitamins-Minerals (MULTIVITAMIN WITH MINERALS) tablet Take 1 tablet by mouth daily.   Netarsudil-Latanoprost (ROCKLATAN) 0.02-0.005 % SOLN Place 1 drop into the left eye daily.   rosuvastatin (CRESTOR) 5 MG tablet Take 1 tablet Daily for Cholesterol   alendronate (FOSAMAX) 70 MG tablet TAKE 1 TABLET ONCE WEEKLY ON AN EMPTY STOMACH WITH A FULL GLASS OF WATER. DONT LIE DOWN AFTER TAKING (Patient not taking: Reported on 06/02/2021)   Flaxseed, Linseed, (FLAXSEED OIL) 1000 MG  CAPS Take 1,000 mg by mouth daily. (Patient not taking: Reported on 01/28/2021)   No current facility-administered medications on file prior to visit.    ROS: all negative except above.   Physical Exam:  BP 132/76   Pulse (!) 55   Temp (!) 97.5 F (36.4 C)   Wt 114 lb (51.7 kg)   SpO2 95%   BMI 17.59 kg/m   General Appearance: Well nourished, in no apparent distress. Eyes: PERRLA, EOMs, conjunctiva no swelling or erythema Sinuses: No Frontal/maxillary tenderness ENT/Mouth: Ext aud canals  clear, TMs without erythema, bulging. No erythema, swelling, or exudate on post pharynx.  Tonsils not swollen or erythematous. Hearing normal.  Neck: Supple, thyroid normal.  Respiratory: Respiratory effort normal, BS equal bilaterally without rales, rhonchi, wheezing or stridor.  Cardio: RRR with no MRGs. Brisk peripheral pulses without edema.  Abdomen: Soft, + BS.  Non tender, no guarding, rebound, hernias, masses. Lymphatics: Non tender without lymphadenopathy.  Musculoskeletal: Full ROM, 5/5 strength, normal gait.  Skin: Warm, dry. Appears she had a small seborrheic keratosis on left forearm, base still present. No irregular size or color Neuro: Cranial nerves intact. Normal muscle tone, no cerebellar symptoms. Sensation intact.  Psych: Awake and oriented X 3, normal affect, Insight and Judgment appropriate.     Alycia Rossetti, NP 2:18 PM [facility]

## 2021-08-05 ENCOUNTER — Ambulatory Visit: Payer: Medicare PPO | Admitting: Nurse Practitioner

## 2021-08-05 ENCOUNTER — Encounter: Payer: Self-pay | Admitting: Nurse Practitioner

## 2021-08-05 VITALS — BP 132/76 | HR 55 | Temp 97.5°F | Wt 114.0 lb

## 2021-08-05 DIAGNOSIS — R0989 Other specified symptoms and signs involving the circulatory and respiratory systems: Secondary | ICD-10-CM

## 2021-08-05 DIAGNOSIS — L821 Other seborrheic keratosis: Secondary | ICD-10-CM | POA: Diagnosis not present

## 2021-08-05 DIAGNOSIS — M810 Age-related osteoporosis without current pathological fracture: Secondary | ICD-10-CM

## 2021-08-13 DIAGNOSIS — M810 Age-related osteoporosis without current pathological fracture: Secondary | ICD-10-CM | POA: Diagnosis not present

## 2021-09-03 NOTE — Progress Notes (Signed)
MEDICARE ANNUAL WELLNESS VISIT AND FOLLOW UP  Assessment:    Annual Medicare annual wellness visit Due annually  Health maintenance reviewed Discussed shingrix, will check with insurance  Aortic Atherosclerosis(HCC) - monitor blood sugars, cholesterol, weight and blood pressure  Hypothyroidism, unspecified hypothyroidism type -cont levothyroxine - TSH   Labile hypertension -Atypically elevated today, restart checking, historically labile - DASH diet and lifestyle review -currently managed by lifestyle - goal <130/80 - Close follow up with log in 1 month   Hyperlipidemia -diet and exercise , high fiber discussed -check lipid panel - does have family history, hesitant to start meds - risks/benefits discussed, suggested CT coronary calcium study for risk screening, interested in pursuing - order placed to schedule  Medication management - CBC with Differential/Platelet - CMP/GFR  Chronic obstructive pulmonary disease, unspecified COPD type ([place]) -former smoker, no active sx, cont to monitor -no current treatment needed  Osteoporosis -back on fosamax 11/2018 to start Prolia - recommend high calcium diet/low dose supplement if needed, vitamin D, weight bearing exercises -Bone Density scan UTD- 03/25/21 Left Forearm T score -3.5  Abnormal Glucose -check a1c at CPE -diet and exercise as tolerated   Vitamin D deficiency At goal at recent check; continue to recommend supplementation for goal of 60-100 Defer vitamin D level  Personal history of colon polyps Due follow up colonoscopy 01/2021 has number to call   Glaucoma  Ophth is managing  Mild protein/calorie malnutrition ([place]) Weights slowly trending down, check CMP Discussed high protein high calorie diet, nuts, avocado, beans  Ideally gain 10 lb in upcoming year   BMI less than 19,adult -exercise as tolerated -cont increased calorie load  Cutaneous horn 1 area on left forearm <1 cm and 1 area on right hand  <1 cm liquid nitrogen applied Monitor and if do not completely resolve return for another treatment  Over 40 minutes of exam, counseling, chart review and critical decision making was performed Future Appointments  Date Time Provider [place]  12/10/2021  2:30 PM Unk Pinto, MD [facility] None  06/07/2022  2:00 PM Unk Pinto, MD [facility] None  09/07/2022  3:00 PM Alycia Rossetti, NP [facility] None     Plan:   During the course of the visit the patient was educated and counseled about appropriate screening and preventive services including:   Pneumococcal vaccine  Prevnar 13 Influenza vaccine Td vaccine Screening electrocardiogram Bone densitometry screening Colorectal cancer screening Diabetes screening Glaucoma screening Nutrition counseling  Advanced directives: requested   Subjective:  Katie Morton is a 76 y.o. female who presents for Medicare Annual Wellness Visit and 3 month follow up.   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   Follows annually with Dr. Jari Pigg for history of SCC.   Recently underwent colonoscopy 02/11/21 by Dr. Stephanie Acre at [facility] due to 20+ polyps, all beninign but 1 year recall due to number.   She is following with Dr. Ander Slade at [facility] for glaucoma, stable.   BMI is Body mass index is 18.02 kg/m., she has been working on diet and exercise, has had low BMI all her life, walks 45 min most days  Wt Readings from Last 3 Encounters:  09/07/21 116 lb 12.8 oz (53 kg)  08/05/21 114 lb (51.7 kg)  06/02/21 116 lb 3.2 oz (52.7 kg)    She has BP cuff but hasn't been checking recently, today their BP is BP: (!) 112/50   BP Readings from Last 3 Encounters:  09/07/21 (!) 112/50  08/05/21 132/76  06/02/21 140/70  historically labile She does workout. She denies chest pain, shortness of breath, dizziness.   She is not on cholesterol medication (taking omega 3 supplement, was prescribed  rosuvastatin and zetia but never started per personal preference, discussed CT coronary calcium which she would like to pursue) and denies myalgias. Her cholesterol is not at goal. The cholesterol last visit was:   Lab Results  Component Value Date   CHOL 177 06/02/2021   HDL 76 06/02/2021   LDLCALC 83 06/02/2021   TRIG 86 06/02/2021   CHOLHDL 2.3 06/02/2021    She has been working on diet and exercise for hx of prediabetes recently well controlled, and denies increased appetite, nausea, paresthesia of the feet, polydipsia, polyuria and visual disturbances. Last A1C in the office was:  Lab Results  Component Value Date   HGBA1C 5.8 (H) 06/02/2021   She is on thyroid medication. Her medication was not changed last visit.  Taking 25 mcg daily.  Lab Results  Component Value Date   TSH 3.38 06/02/2021   Last GFR: Lab Results  Component Value Date   GFRNONAA 93 06/02/2020   Patient is on Vitamin D supplement.   Lab Results  Component Value Date   VD25OH 83 06/02/2021      Medication Review: Current Outpatient Medications on File Prior to Visit  Medication Sig Dispense Refill   Ascorbic Acid (VITAMIN C PO) Take 1 tablet by mouth daily.     aspirin EC 81 MG tablet Take 81 mg by mouth daily.     Calcium Citrate-Vitamin D (CALCIUM CITRATE + D PO) Take by mouth daily.     Cholecalciferol (VITAMIN D PO) Take 2,000 Units by mouth daily.     CINNAMON PO Take 1,000 mg by mouth 2 (two) times daily.     denosumab (PROLIA) 60 MG/ML SOSY injection Inject 60 mg into the skin every 6 (six) months.     dorzolamide (TRUSOPT) 2 % ophthalmic solution 3 (three) times daily.  11   fish oil-omega-3 fatty acids 1000 MG capsule Take 1 g by mouth daily.      levothyroxine (SYNTHROID) 50 MCG tablet TAKE 1/2 TABLET DAILY ON AN EMPTY STOMACH FOR 30 MIN 45 tablet 3   Multiple Vitamins-Minerals (MULTIVITAMIN WITH MINERALS) tablet Take 1 tablet by mouth daily.     Netarsudil-Latanoprost (ROCKLATAN) 0.02-0.005  % SOLN Place 1 drop into the left eye daily.     rosuvastatin (CRESTOR) 5 MG tablet Take 1 tablet Daily for Cholesterol 90 tablet 3   VITAMIN K PO Take by mouth.     No current facility-administered medications on file prior to visit.    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   Personal history of colonic polyps 07/25/2019   History of squamous cell carcinoma 07/25/2019   Protein-calorie malnutrition ([place]) 02/28/2018   BMI less than 19,adult 01/23/2015   Hypothyroidism 06/11/2014   Medication management 01/01/2013   Vitamin D deficiency 01/01/2013   Hyperlipidemia    Abnormal glucose    Osteoporosis 09/20/2012   COPD (chronic obstructive pulmonary disease) (HCC)     Screening Tests Immunization History  Administered Date(s)  Administered   Influenza, High Dose Seasonal PF 01/02/2013, 01/07/2014, 01/23/2015, 12/01/2015, 01/19/2017, 12/29/2017, 12/20/2018, 01/02/2020, 01/28/2021   Influenza-Unspecified 12/16/2011, 12/23/2016, 01/23/2017   PFIZER(Purple Top)SARS-COV-2 Vaccination 04/02/2019, 04/27/2019, 12/20/2019   PNEUMOCOCCAL CONJUGATE-20 01/28/2021   Pneumococcal Conjugate-13 01/07/2014   Pneumococcal Polysaccharide-23 08/07/2015   Pneumococcal-Unspecified 02/23/1995   Td 02/22/2005, 07/28/2016   Zoster, Live 12/16/2011    Preventative care: Last colonoscopy: 01/2021 due 01/2022 per Dr. Stephanie Acre at [place] mammogram: 03/25/21 Negative DEXA: 08/2016, started on fosamax x 2015, stopped early 2019, DEXA 11/2018 with radius T score -3.3, restarted fosamax 11/2018, DEXA 03/25/21 -3.5 due to start Prolia  Prior vaccinations: TD or Tdap: 2018 Influenza: 2021 Pneumococcal:  2017 Prevnar13: 2015 Shingles/Zostavax: 2013 - check with insurance  Covid 19: 2/2, 2021, pfizer + booster  Names of Other Physician/Practitioners you currently use: 1. [facility]- here for primary care 2. Dr. Leanord Asal, eye doctor, last visit 08/20/21, monitoring cataract and glaucoma 3. Dr. Gilford Rile, dentist, last visit 07/2021, goes q6m Patient Care Team: MUnk Pinto MD as PCP - General (Internal Medicine) ELaurence Spates MD (Inactive) as Consulting Physician (Gastroenterology) VConstance Holster DDS GJari Pigg MD as Consulting Physician (Dermatology) GRoseanne Kaufman MD as Consulting Physician (Orthopedic Surgery)  SURGICAL HISTORY She  has a past surgical history that includes Tonsillectomy; Mandible surgery; and Hip pinning, cannulated (Left, 09/19/2012). FAMILY HISTORY Her family history includes CAD in her maternal grandfather and paternal grandfather; COPD in her father; Cancer in her maternal aunt and paternal aunt; Cancer - Other in her father; Diabetes in her mother; Heart disease in her brother and mother; Hypertension in her mother; Mitral valve prolapse in her mother; Stroke in her father. SOCIAL HISTORY She  reports that she quit smoking about 43 years ago. Her smoking use included cigarettes. She has never used smokeless tobacco. She reports current alcohol use of about 14.0 standard drinks of alcohol per week. She reports that she does not use drugs.   MEDICARE WELLNESS OBJECTIVES: Physical activity: Current Exercise Habits: Home exercise routine, Type of exercise: walking, Time (Minutes): 40, Frequency (Times/Week): 5, Weekly Exercise (Minutes/Week): 200, Intensity: Mild Cardiac risk factors: Cardiac Risk Factors include: dyslipidemia;hypertension;advanced age (>592m, >6>87omen) Depression/mood screen:      09/07/2021    3:02 PM  Depression screen PHQ 2/9  Decreased Interest 0  Down, Depressed, Hopeless 0  PHQ - 2 Score 0     ADLs:     09/07/2021    3:02 PM 06/02/2021   12:06 AM  In your present state of health, do you have any difficulty performing the following activities:  Hearing? 0 0  Vision? 0 0  Difficulty concentrating or making decisions? 0 0  Walking or climbing stairs? 0 0  Dressing or bathing? 0 0  Doing errands, shopping? 0 0     Cognitive Testing  Alert? Yes  Normal Appearance?Yes  Oriented to person? Yes  Place? Yes   Time? Yes  Recall of three objects?  Yes  Can perform simple calculations? Yes  Displays appropriate judgment?Yes  Can read the correct time from a watch face?Yes  EOL planning: Does Patient Have a Medical Advance Directive?: No Would patient like information on creating a medical advance directive?: No - Patient declined  Review of Systems  Constitutional:  Negative for malaise/fatigue and weight loss.  HENT:  Negative for hearing loss and tinnitus.   Eyes:  Negative for blurred vision and double vision.  Respiratory:  Negative for cough, shortness of breath  and wheezing.   Cardiovascular:  Negative for chest pain, palpitations, orthopnea, claudication and leg swelling.  Gastrointestinal:  Negative for abdominal pain, blood in stool, constipation, diarrhea, heartburn, melena, nausea and vomiting.  Genitourinary: Negative.   Musculoskeletal:  Negative for falls, joint pain (right hand pain intermittently, weak grip) and myalgias.  Skin:  Negative for rash.       Cutaneous horns left forearm, right hand  Neurological:  Negative for dizziness, tingling, sensory change, weakness and headaches.  Endo/Heme/Allergies:  Negative for polydipsia.  Psychiatric/Behavioral: Negative.    All other systems reviewed and are negative.    Objective:     Today's Vitals   09/07/21 1447  BP: (!) 112/50  Pulse: 61  Temp: (!) 97.5 F (36.4 C)  SpO2: 95%  Weight: 116 lb 12.8 oz (53 kg)    Body mass index is 18.02 kg/m.  General appearance: alert, no distress, WD/WN,  female HEENT: normocephalic, sclerae anicteric, TMs pearly, nares patent, no discharge or erythema, pharynx normal Oral cavity: MMM, no lesions Neck: supple, no lymphadenopathy, no thyromegaly, no masses Heart: RRR, normal S1, S2, no murmurs Lungs: CTA bilaterally, no wheezes, rhonchi, or rales Abdomen: +bs, soft, non tender, non distended, no masses, no hepatomegaly, no splenomegaly Musculoskeletal: nontender, no swelling; she has reduced ROM of R 1st digit base with mild contracture  Extremities: no edema, no cyanosis, no clubbing Pulses: 2+ symmetric, upper and lower extremities, normal cap refill Neurological: alert, oriented x 3, CN2-12 intact, strength normal upper extremities and lower extremities, sensation normal throughout, DTRs 2+ throughout, no cerebellar signs, gait normal Psychiatric: normal affect, behavior normal, pleasant  Skin : < 1 cm cutaneous horn on right hand and left froearm Liquid nitrogen was applied for 10-12 seconds to the skin lesions and the expected blistering or scabbing reaction explained. Do not pick at the areas. Patient reminded to expect hypopigmented scars from the procedure. Return if lesions fail to fully resolve.   Medicare Attestation I have personally reviewed: The patient's medical and social history Their use of alcohol, tobacco or illicit drugs Their current medications and supplements The patient's functional ability including ADLs,fall risks, home safety risks, cognitive, and hearing and visual impairment Diet and physical activities Evidence for depression or mood disorders  The patient's weight, height, BMI, and visual acuity have been recorded in the chart.  I have made referrals, counseling, and provided education to the patient based on review of the above and I have provided the patient with a written personalized care plan for preventive services.     Alycia Rossetti, NP   09/07/2021

## 2021-09-07 ENCOUNTER — Ambulatory Visit: Payer: Medicare PPO | Admitting: Nurse Practitioner

## 2021-09-07 ENCOUNTER — Encounter: Payer: Self-pay | Admitting: Nurse Practitioner

## 2021-09-07 VITALS — BP 112/50 | HR 61 | Temp 97.5°F | Wt 116.8 lb

## 2021-09-07 DIAGNOSIS — L858 Other specified epidermal thickening: Secondary | ICD-10-CM

## 2021-09-07 DIAGNOSIS — Z79899 Other long term (current) drug therapy: Secondary | ICD-10-CM | POA: Diagnosis not present

## 2021-09-07 DIAGNOSIS — E46 Unspecified protein-calorie malnutrition: Secondary | ICD-10-CM | POA: Diagnosis not present

## 2021-09-07 DIAGNOSIS — E039 Hypothyroidism, unspecified: Secondary | ICD-10-CM | POA: Diagnosis not present

## 2021-09-07 DIAGNOSIS — I700 Atherosclerosis of aorta: Secondary | ICD-10-CM | POA: Diagnosis not present

## 2021-09-07 DIAGNOSIS — E559 Vitamin D deficiency, unspecified: Secondary | ICD-10-CM

## 2021-09-07 DIAGNOSIS — R7309 Other abnormal glucose: Secondary | ICD-10-CM | POA: Diagnosis not present

## 2021-09-07 DIAGNOSIS — R0989 Other specified symptoms and signs involving the circulatory and respiratory systems: Secondary | ICD-10-CM

## 2021-09-07 DIAGNOSIS — J449 Chronic obstructive pulmonary disease, unspecified: Secondary | ICD-10-CM

## 2021-09-07 DIAGNOSIS — Z0001 Encounter for general adult medical examination with abnormal findings: Secondary | ICD-10-CM

## 2021-09-07 DIAGNOSIS — R6889 Other general symptoms and signs: Secondary | ICD-10-CM | POA: Diagnosis not present

## 2021-09-07 DIAGNOSIS — D489 Neoplasm of uncertain behavior, unspecified: Secondary | ICD-10-CM

## 2021-09-07 DIAGNOSIS — Z0000 Encounter for general adult medical examination without abnormal findings: Secondary | ICD-10-CM

## 2021-09-07 DIAGNOSIS — M810 Age-related osteoporosis without current pathological fracture: Secondary | ICD-10-CM

## 2021-09-07 DIAGNOSIS — E782 Mixed hyperlipidemia: Secondary | ICD-10-CM

## 2021-09-07 DIAGNOSIS — Z86010 Personal history of colonic polyps: Secondary | ICD-10-CM

## 2021-09-08 LAB — CBC WITH DIFFERENTIAL/PLATELET
Absolute Monocytes: 520 cells/uL (ref 200–950)
Basophils Absolute: 78 cells/uL (ref 0–200)
Basophils Relative: 1.2 %
Eosinophils Absolute: 377 cells/uL (ref 15–500)
Eosinophils Relative: 5.8 %
HCT: 38.7 % (ref 35.0–45.0)
Hemoglobin: 12.7 g/dL (ref 11.7–15.5)
Lymphs Abs: 1677 cells/uL (ref 850–3900)
MCH: 30 pg (ref 27.0–33.0)
MCHC: 32.8 g/dL (ref 32.0–36.0)
MCV: 91.5 fL (ref 80.0–100.0)
MPV: 11.8 fL (ref 7.5–12.5)
Monocytes Relative: 8 %
Neutro Abs: 3848 cells/uL (ref 1500–7800)
Neutrophils Relative %: 59.2 %
Platelets: 213 10*3/uL (ref 140–400)
RBC: 4.23 10*6/uL (ref 3.80–5.10)
RDW: 12.2 % (ref 11.0–15.0)
Total Lymphocyte: 25.8 %
WBC: 6.5 10*3/uL (ref 3.8–10.8)

## 2021-09-08 LAB — COMPLETE METABOLIC PANEL WITH GFR
AG Ratio: 1.5 (calc) (ref 1.0–2.5)
ALT: 23 U/L (ref 6–29)
AST: 21 U/L (ref 10–35)
Albumin: 4.1 g/dL (ref 3.6–5.1)
Alkaline phosphatase (APISO): 66 U/L (ref 37–153)
BUN/Creatinine Ratio: 21 (calc) (ref 6–22)
BUN: 11 mg/dL (ref 7–25)
CO2: 28 mmol/L (ref 20–32)
Calcium: 9.4 mg/dL (ref 8.6–10.4)
Chloride: 104 mmol/L (ref 98–110)
Creat: 0.53 mg/dL — ABNORMAL LOW (ref 0.60–1.00)
Globulin: 2.7 g/dL (calc) (ref 1.9–3.7)
Glucose, Bld: 113 mg/dL — ABNORMAL HIGH (ref 65–99)
Potassium: 4.3 mmol/L (ref 3.5–5.3)
Sodium: 139 mmol/L (ref 135–146)
Total Bilirubin: 0.4 mg/dL (ref 0.2–1.2)
Total Protein: 6.8 g/dL (ref 6.1–8.1)
eGFR: 97 mL/min/{1.73_m2} (ref >=60)

## 2021-09-08 LAB — TSH: TSH: 3.07 mIU/L (ref 0.40–4.50)

## 2021-09-08 LAB — LIPID PANEL
Cholesterol: 158 mg/dL (ref <200)
HDL: 61 mg/dL (ref >=50)
LDL Cholesterol (Calc): 74 mg/dL (calc)
Non-HDL Cholesterol (Calc): 97 mg/dL (calc) (ref <130)
Total CHOL/HDL Ratio: 2.6 (calc) (ref <5.0)
Triglycerides: 155 mg/dL — ABNORMAL HIGH (ref <150)

## 2021-09-10 DIAGNOSIS — H524 Presbyopia: Secondary | ICD-10-CM | POA: Diagnosis not present

## 2021-09-10 DIAGNOSIS — H401132 Primary open-angle glaucoma, bilateral, moderate stage: Secondary | ICD-10-CM | POA: Diagnosis not present

## 2021-09-10 DIAGNOSIS — H2513 Age-related nuclear cataract, bilateral: Secondary | ICD-10-CM | POA: Diagnosis not present

## 2021-09-10 DIAGNOSIS — H25013 Cortical age-related cataract, bilateral: Secondary | ICD-10-CM | POA: Diagnosis not present

## 2021-09-29 DIAGNOSIS — Z85828 Personal history of other malignant neoplasm of skin: Secondary | ICD-10-CM | POA: Diagnosis not present

## 2021-09-29 DIAGNOSIS — C44711 Basal cell carcinoma of skin of unspecified lower limb, including hip: Secondary | ICD-10-CM | POA: Diagnosis not present

## 2021-09-29 DIAGNOSIS — D2372 Other benign neoplasm of skin of left lower limb, including hip: Secondary | ICD-10-CM | POA: Diagnosis not present

## 2021-09-29 DIAGNOSIS — D2371 Other benign neoplasm of skin of right lower limb, including hip: Secondary | ICD-10-CM | POA: Diagnosis not present

## 2021-09-29 DIAGNOSIS — L578 Other skin changes due to chronic exposure to nonionizing radiation: Secondary | ICD-10-CM | POA: Diagnosis not present

## 2021-09-29 DIAGNOSIS — D2272 Melanocytic nevi of left lower limb, including hip: Secondary | ICD-10-CM | POA: Diagnosis not present

## 2021-09-29 DIAGNOSIS — D2262 Melanocytic nevi of left upper limb, including shoulder: Secondary | ICD-10-CM | POA: Diagnosis not present

## 2021-09-29 DIAGNOSIS — C44629 Squamous cell carcinoma of skin of left upper limb, including shoulder: Secondary | ICD-10-CM | POA: Diagnosis not present

## 2021-09-29 DIAGNOSIS — L821 Other seborrheic keratosis: Secondary | ICD-10-CM | POA: Diagnosis not present

## 2021-09-29 DIAGNOSIS — D1721 Benign lipomatous neoplasm of skin and subcutaneous tissue of right arm: Secondary | ICD-10-CM | POA: Diagnosis not present

## 2021-09-29 DIAGNOSIS — Z808 Family history of malignant neoplasm of other organs or systems: Secondary | ICD-10-CM | POA: Diagnosis not present

## 2021-11-25 DIAGNOSIS — H401221 Low-tension glaucoma, left eye, mild stage: Secondary | ICD-10-CM | POA: Diagnosis not present

## 2021-11-25 DIAGNOSIS — H401213 Low-tension glaucoma, right eye, severe stage: Secondary | ICD-10-CM | POA: Diagnosis not present

## 2021-12-09 DIAGNOSIS — C44629 Squamous cell carcinoma of skin of left upper limb, including shoulder: Secondary | ICD-10-CM | POA: Diagnosis not present

## 2021-12-10 ENCOUNTER — Encounter: Payer: Self-pay | Admitting: Internal Medicine

## 2021-12-10 ENCOUNTER — Ambulatory Visit: Payer: Medicare PPO | Admitting: Internal Medicine

## 2021-12-10 VITALS — BP 138/68 | HR 61 | Temp 98.4°F | Resp 16 | Ht 67.5 in | Wt 118.0 lb

## 2021-12-10 DIAGNOSIS — E039 Hypothyroidism, unspecified: Secondary | ICD-10-CM

## 2021-12-10 DIAGNOSIS — R7309 Other abnormal glucose: Secondary | ICD-10-CM

## 2021-12-10 DIAGNOSIS — Z79899 Other long term (current) drug therapy: Secondary | ICD-10-CM

## 2021-12-10 DIAGNOSIS — E782 Mixed hyperlipidemia: Secondary | ICD-10-CM

## 2021-12-10 DIAGNOSIS — Z23 Encounter for immunization: Secondary | ICD-10-CM | POA: Diagnosis not present

## 2021-12-10 DIAGNOSIS — E559 Vitamin D deficiency, unspecified: Secondary | ICD-10-CM

## 2021-12-10 DIAGNOSIS — R0989 Other specified symptoms and signs involving the circulatory and respiratory systems: Secondary | ICD-10-CM

## 2021-12-10 NOTE — Patient Instructions (Signed)

## 2021-12-10 NOTE — Progress Notes (Signed)
Future Appointments  Date Time Provider Department  12/10/2021  2:30 PM Unk Pinto, MD [facility]  06/07/2022                         cpe   2:00 PM Unk Pinto, MD [facility]  09/07/2022  3:00 PM Alycia Rossetti, NP [facility]       This very nice 76 y.o. MWF with  labile HTN, HLD, Prediabetes  and Vitamin D Deficiency presents for a 6 month f/u OV.                                           Patient has hx/o Osteoporosis and had been off of Fosamax from 2019 until dexaBMD in Oct 2020 showed worsening with T -3.3 and Fosamax was restarted.        HTN predates circa 2012. Patient's BP has been controlled at home and patient denies any cardiac symptoms as chest pain, palpitations, shortness of breath, dizziness or ankle swelling. Today's BP is at goal  - 138/68.       Patient's hyperlipidemia is not controlled with diet. Last lipids were  at goal:  Lab Results  Component Value Date   CHOL 185 12/10/2021   HDL 77 12/10/2021   LDLCALC 87 12/10/2021   TRIG 118 12/10/2021   CHOLHDL 2.4 12/10/2021        Patient has hx/o  prediabetes (A1c 5.7% /2012 & 6.0% /2016).  Patient denies reactive hypoglycemic symptoms, visual blurring, diabetic polys or paresthesias. Last A1c was near goal:  Lab Results  Component Value Date   HGBA1C 6.0 (H) 12/10/2021                                         In 2015, patient was dx'd Hypothyroid and started on Thyroid Replacement.       Finally, patient has history of Vitamin D Deficiency and last Vitamin D was at goal:  Lab Results  Component Value Date   VD25OH 86 12/10/2021       Current Outpatient Medications on File Prior to Visit  Medication Sig   alendronate (FOSAMAX) 70 MG tablet TAKE 1 TABLET WEEKLY    VITAMIN C Take 1 tablet  daily.   aspirin EC 81 MG tablet Take  daily.   Calcium Citrate-Vitamin D Take daily.   VITAMIN D  2,000 u  Take   daily.    CINNAMON  1,000 mg  Take  2  times daily.    TRUSOPT 2 % ophth soln 1  DROP BOTH EYES TWICE A DAY   fish oil-omega-3 fatty acids 1000 MG capsule Take  daily.    FLAXSEED OIL 1000 MG CAPS Take daily.   levothyroxine  50 MCG tablet TAKE 1/2 TABLET DAILY    OSTEO BI-FLEX JOINT SHIELD  Take 1 tablet  2  times daily.   Multiple Vitamins-Minerals  Take 1 tablet  daily.   PRESERVISION AREDS  Take 1 tablet  daily.   Netarsudil-Latanoprost (Rocklatan)  Soln Place 1 drop into both eyes daily.   Tumeric w/ Black pepper ext 2 capsules daily.    zinc  50 MG tablet Take 5 daily.     Allergies  Allergen Reactions   Brimonidine Itching  Silver Sulfadiazine     Past Medical History:  Diagnosis Date   Arthritis    Cancer ([place]) 2016   squamous cell skin cancer    COPD (chronic obstructive pulmonary disease) (HCC)    Elevated hemoglobin A1c    Hyperlipidemia    Labile hypertension    Osteoporosis    Villous adenoma 2008    Health Maintenance  Topic Date Due   INFLUENZA VACCINE  09/22/2020   COLONOSCOP 02/07/2021   MAMMOGRAM  02/10/2022   TETANUS/TDAP  07/29/2026   DEXA SCAN  Completed   COVID-19 Vaccine  Completed   Hepatitis C Screening  Completed   PNA vac Low Risk Adult  Completed   HPV VACCINES  Aged Out    Immunization History  Administered Date(s) Administered   Influenza, High Dose Seasonal PF 12/20/2018, 01/02/2020   Influenza 12/23/2016, 01/23/2017   PFIZER  SARS-COV-2 Vacc 04/02/2019, 04/27/2019, 12/20/2019   Pneumococcal - 13 01/07/2014   Pneumococcal - 23 08/07/2015   Pneumococcal -23 02/23/1995   Td 02/22/2005, 07/28/2016   Zoster 12/16/2011    Last Colon - 02/08/2020 - Dr Ivor Messier [facility] - polyps with high grade dysplasia recommending 1 year f/u Colon  - done Feb 11, 2021 and Recc 1 yr f/u  due Dec 2023  Last MGM - 03/25/2021  Last dexaBMD  - 03/25/2021   Past Surgical History:  Procedure Laterality Date   HIP PINNING,CANNULATED Left 09/19/2012   Procedure: Percutaneous Screw Fixation Left Hip Fracture;  Surgeon: Nita Sells, MD;  Location: [facility];  Service: Orthopedics;  Laterality: Left;   MANDIBLE SURGERY     TONSILLECTOMY      Family History  Problem Relation Age of Onset   Hypertension Mother    Diabetes Mother    Mitral valve prolapse Mother    Heart disease Mother    Cancer - Other Father        Leukemia and Melanoma   COPD Father    Stroke Father    CAD Paternal Grandfather    CAD Maternal Grandfather    Heart disease Brother    Cancer Maternal Aunt        colon   Cancer Paternal Aunt        colon    Social History   Tobacco Use   Smoking status: Former Smoker    Quit date: 02/22/1978    Years since quitting: 42.3   Smokeless tobacco: Never Used  Substance Use Topics   Alcohol use: Yes    Alcohol/week: 14.0 standard drinks    Types: 14 Standard drinks or equivalent per week   Drug use: No    ROS Constitutional: Denies fever, chills, weight loss/gain, headaches, insomnia,  night sweats, and change in appetite. Does c/o fatigue. Eyes: Denies redness, blurred vision, diplopia, discharge, itchy, watery eyes.  ENT: Denies discharge, congestion, post nasal drip, epistaxis, sore throat, earache, hearing loss, dental pain, Tinnitus, Vertigo, Sinus pain, snoring.  Cardio: Denies chest pain, palpitations, irregular heartbeat, syncope, dyspnea, diaphoresis, orthopnea, PND, claudication, edema Respiratory: denies cough, dyspnea, DOE, pleurisy, hoarseness, laryngitis, wheezing.  Gastrointestinal: Denies dysphagia, heartburn, reflux, water brash, pain, cramps, nausea, vomiting, bloating, diarrhea, constipation, hematemesis, melena, hematochezia, jaundice, hemorrhoids Genitourinary: Denies dysuria, frequency, urgency, nocturia, hesitancy, discharge, hematuria, flank pain Breast: Breast lumps, nipple discharge, bleeding.  Musculoskeletal: Denies arthralgia, myalgia, stiffness, Jt. Swelling, pain, limp, and strain/sprain. Denies falls. Skin: Denies puritis, rash, hives, warts, acne,  eczema, changing in skin lesion Neuro: No weakness, tremor, incoordination,  spasms, paresthesia, pain Psychiatric: Denies confusion, memory loss, sensory loss. Denies Depression. Endocrine: Denies change in weight, skin, hair change, nocturia, and paresthesia, diabetic polys, visual blurring, hyper / hypo glycemic episodes.  Heme/Lymph: No excessive bleeding, bruising, enlarged lymph nodes.  Physical Exam  BP 138/68   Pulse 61   Temp 98.4 F (36.9 C)   Resp 16   Ht 5' 7.5" (1.715 m)   Wt 118 lb (53.5 kg)   SpO2 99%   BMI 18.21 kg/m   General Appearance: Well nourished, well groomed and in no apparent distress.  Eyes: PERRLA, EOMs, conjunctiva no swelling or erythema, normal fundi and vessels. Sinuses: No frontal/maxillary tenderness ENT/Mouth: EACs patent / TMs  nl. Nares clear without erythema, swelling, mucoid exudates. Oral hygiene is good. No erythema, swelling, or exudate. Tongue normal, non-obstructing. Tonsils not swollen or erythematous. Hearing normal.  Neck: Supple, thyroid not palpable. No bruits, nodes or JVD. Respiratory: Respiratory effort normal.  BS equal and clear bilateral without rales, rhonci, wheezing or stridor. Cardio: Heart sounds are normal with regular rate and rhythm and no murmurs, rubs or gallops. Peripheral pulses are normal and equal bilaterally without edema. No aortic or femoral bruits. Chest: symmetric with normal excursions and percussion. Breasts: Symmetric, without lumps, nipple discharge, retractions, or fibrocystic changes.  Abdomen: Flat, soft with bowel sounds active. Nontender, no guarding, rebound, hernias, masses, or organomegaly.  Lymphatics: Non tender without lymphadenopathy.  Genitourinary:  Musculoskeletal: Full ROM all peripheral extremities, joint stability, 5/5 strength, and normal gait. Skin: Warm and dry without rashes, lesions, cyanosis, clubbing or  ecchymosis.  Neuro: Cranial nerves intact, reflexes equal bilaterally. Normal  muscle tone, no cerebellar symptoms. Sensation intact.  Pysch: Alert and oriented X 3, normal affect, Insight and Judgment appropriate.   Assessment and Plan  1. Labile hypertension  - CBC with Differential/Platelet - COMPLETE METABOLIC PANEL WITH GFR - Magnesium - TSH  2. Hyperlipidemia, mixed  - Lipid panel - TSH  3. Abnormal glucose  - Hemoglobin A1c - Insulin, random  4. Vitamin D deficiency  - VITAMIN D 25 Hydroxy   5. Hypothyroidism  - TSH  6. Medication management  - CBC with Differential/Platelet - COMPLETE METABOLIC PANEL WITH GFR - Magnesium - Lipid panel - TSH - Hemoglobin A1c - Insulin, random - VITAMIN D 25 Hydroxy   7. Need for immunization against influenza  - Flu vaccine HIGH DOSE PF (Fluzone High dose)             Patient was counseled in prudent diet to achieve/maintain BMI less than 25 for weight control, BP monitoring, regular exercise and medications. Discussed med's effects and SE's. Screening labs and tests as requested with regular follow-up as recommended. Over 40 minutes of exam, counseling, chart review and high complex critical decision making was performed.   Kirtland Bouchard, MD

## 2021-12-11 LAB — COMPLETE METABOLIC PANEL WITH GFR
AG Ratio: 1.5 (calc) (ref 1.0–2.5)
ALT: 21 U/L (ref 6–29)
AST: 20 U/L (ref 10–35)
Albumin: 4.4 g/dL (ref 3.6–5.1)
Alkaline phosphatase (APISO): 59 U/L (ref 37–153)
BUN/Creatinine Ratio: 25 (calc) — ABNORMAL HIGH (ref 6–22)
BUN: 14 mg/dL (ref 7–25)
CO2: 29 mmol/L (ref 20–32)
Calcium: 9.6 mg/dL (ref 8.6–10.4)
Chloride: 102 mmol/L (ref 98–110)
Creat: 0.56 mg/dL — ABNORMAL LOW (ref 0.60–1.00)
Globulin: 3 g/dL (calc) (ref 1.9–3.7)
Glucose, Bld: 123 mg/dL — ABNORMAL HIGH (ref 65–99)
Potassium: 4.5 mmol/L (ref 3.5–5.3)
Sodium: 139 mmol/L (ref 135–146)
Total Bilirubin: 0.4 mg/dL (ref 0.2–1.2)
Total Protein: 7.4 g/dL (ref 6.1–8.1)
eGFR: 96 mL/min/{1.73_m2} (ref >=60)

## 2021-12-11 LAB — LIPID PANEL
Cholesterol: 185 mg/dL (ref <200)
HDL: 77 mg/dL (ref >=50)
LDL Cholesterol (Calc): 87 mg/dL (calc)
Non-HDL Cholesterol (Calc): 108 mg/dL (calc) (ref <130)
Total CHOL/HDL Ratio: 2.4 (calc) (ref <5.0)
Triglycerides: 118 mg/dL (ref <150)

## 2021-12-11 LAB — CBC WITH DIFFERENTIAL/PLATELET
Absolute Monocytes: 584 cells/uL (ref 200–950)
Basophils Absolute: 72 cells/uL (ref 0–200)
Basophils Relative: 0.9 %
Eosinophils Absolute: 312 cells/uL (ref 15–500)
Eosinophils Relative: 3.9 %
HCT: 42.2 % (ref 35.0–45.0)
Hemoglobin: 13.6 g/dL (ref 11.7–15.5)
Lymphs Abs: 1896 cells/uL (ref 850–3900)
MCH: 29.5 pg (ref 27.0–33.0)
MCHC: 32.2 g/dL (ref 32.0–36.0)
MCV: 91.5 fL (ref 80.0–100.0)
MPV: 12.1 fL (ref 7.5–12.5)
Monocytes Relative: 7.3 %
Neutro Abs: 5136 cells/uL (ref 1500–7800)
Neutrophils Relative %: 64.2 %
Platelets: 276 10*3/uL (ref 140–400)
RBC: 4.61 10*6/uL (ref 3.80–5.10)
RDW: 12 % (ref 11.0–15.0)
Total Lymphocyte: 23.7 %
WBC: 8 10*3/uL (ref 3.8–10.8)

## 2021-12-11 LAB — HEMOGLOBIN A1C
Hgb A1c MFr Bld: 6 % of total Hgb — ABNORMAL HIGH (ref <5.7)
Mean Plasma Glucose: 126 mg/dL
eAG (mmol/L): 7 mmol/L

## 2021-12-11 LAB — TSH: TSH: 4.38 mIU/L (ref 0.40–4.50)

## 2021-12-11 LAB — INSULIN, RANDOM: Insulin: 14.4 u[IU]/mL

## 2021-12-11 LAB — MAGNESIUM: Magnesium: 2.4 mg/dL (ref 1.5–2.5)

## 2021-12-11 LAB — VITAMIN D 25 HYDROXY (VIT D DEFICIENCY, FRACTURES): Vit D, 25-Hydroxy: 86 ng/mL (ref 30–100)

## 2021-12-11 NOTE — Progress Notes (Signed)
<><><><><><><><><><><><><><><><><><><><><><><><><><><><><><><><><> <><><><><><><><><><><><><><><><><><><><><><><><><><><><><><><><><> - Test results slightly outside the reference range are not unusual. If there is anything important, I will review this with you,  otherwise it is considered normal test values.  If you have further questions,  please do not hesitate to contact me at the office or via My Chart.  <><><><><><><><><><><><><><><><><><><><><><><><><><><><><><><><><> <><><><><><><><><><><><><><><><><><><><><><><><><><><><><><><><><>  -  A1c = 6.0% Blood sugar and A1c are  still elevated in the borderline and  early or pre-diabetes range  ( & actually a little higher )  - PreDiabetes  has the same 300% increased risk for   heart attack, stroke, cancer and alzheimer- type vascular dementia                                                                                       as full blown diabetes.   But the good news is that diet, exercise with weight loss can                                                                 cure the early diabetes at this point.  Also, There are many good studies that show taking Cinnamon capsules                               will lower blood sugar and normalize insulin levels & A1c levels.  - I also take Cinnamon 1,000 mg 2 x /day & that 's what I recommend to patients .   - Other studies have shown taking 1 ounce of vinegar diluted in                                                      a glass of water will lower blood sugar &                                                                                                                correct A1c  - I suggest that you do both things.  <><><><><><><><><><><><><><><><><><><><><><><><><><><><><><><><><> <><><><><><><><><><><><><><><><><><><><><><><><><><><><><><><><><>  - Total Chol = 185  & LDL Chol = 87   - Both  Excellent   - Very low risk for Heart Attack  /  Stroke <><><><><><><><><><><><><><><><><><><><><><><><><><><><><><><><><>  -  Vitamin D = 86 - Excellent   -  Please keep dose same  <><><><><><><><><><><><><><><><><><><><><><><><><><><><><><><><><>  -  All Else - CBC - Kidneys - Electrolytes - Liver - Magnesium & Thyroid    - all  Normal / OK <><><><><><><><><><><><><><><><><><><><><><><><><><><><><><><><><> <><><><><><><><><><><><><><><><><><><><><><><><><><><><><><><><><>

## 2021-12-12 ENCOUNTER — Encounter: Payer: Self-pay | Admitting: Internal Medicine

## 2021-12-17 ENCOUNTER — Other Ambulatory Visit: Payer: Self-pay | Admitting: Internal Medicine

## 2022-02-09 ENCOUNTER — Other Ambulatory Visit: Payer: Self-pay | Admitting: Internal Medicine

## 2022-02-09 DIAGNOSIS — Z1231 Encounter for screening mammogram for malignant neoplasm of breast: Secondary | ICD-10-CM

## 2022-02-11 DIAGNOSIS — E039 Hypothyroidism, unspecified: Secondary | ICD-10-CM | POA: Diagnosis not present

## 2022-02-11 DIAGNOSIS — D122 Benign neoplasm of ascending colon: Secondary | ICD-10-CM | POA: Diagnosis not present

## 2022-02-11 DIAGNOSIS — Z79899 Other long term (current) drug therapy: Secondary | ICD-10-CM | POA: Diagnosis not present

## 2022-02-11 DIAGNOSIS — K635 Polyp of colon: Secondary | ICD-10-CM | POA: Diagnosis not present

## 2022-02-11 DIAGNOSIS — D120 Benign neoplasm of cecum: Secondary | ICD-10-CM | POA: Diagnosis not present

## 2022-02-11 DIAGNOSIS — J449 Chronic obstructive pulmonary disease, unspecified: Secondary | ICD-10-CM | POA: Diagnosis not present

## 2022-02-11 DIAGNOSIS — D123 Benign neoplasm of transverse colon: Secondary | ICD-10-CM | POA: Diagnosis not present

## 2022-02-17 DIAGNOSIS — M810 Age-related osteoporosis without current pathological fracture: Secondary | ICD-10-CM | POA: Diagnosis not present

## 2022-02-17 LAB — HM COLONOSCOPY

## 2022-03-05 ENCOUNTER — Other Ambulatory Visit: Payer: Self-pay | Admitting: Nurse Practitioner

## 2022-03-05 DIAGNOSIS — E039 Hypothyroidism, unspecified: Secondary | ICD-10-CM

## 2022-03-15 ENCOUNTER — Encounter: Payer: Self-pay | Admitting: Nurse Practitioner

## 2022-03-15 ENCOUNTER — Ambulatory Visit: Payer: Medicare PPO | Admitting: Nurse Practitioner

## 2022-03-15 VITALS — BP 128/66 | HR 58 | Temp 97.2°F | Ht 67.5 in | Wt 120.8 lb

## 2022-03-15 DIAGNOSIS — E559 Vitamin D deficiency, unspecified: Secondary | ICD-10-CM

## 2022-03-15 DIAGNOSIS — H401230 Low-tension glaucoma, bilateral, stage unspecified: Secondary | ICD-10-CM

## 2022-03-15 DIAGNOSIS — R7309 Other abnormal glucose: Secondary | ICD-10-CM | POA: Diagnosis not present

## 2022-03-15 DIAGNOSIS — E039 Hypothyroidism, unspecified: Secondary | ICD-10-CM | POA: Diagnosis not present

## 2022-03-15 DIAGNOSIS — R0989 Other specified symptoms and signs involving the circulatory and respiratory systems: Secondary | ICD-10-CM | POA: Diagnosis not present

## 2022-03-15 DIAGNOSIS — R6889 Other general symptoms and signs: Secondary | ICD-10-CM | POA: Diagnosis not present

## 2022-03-15 DIAGNOSIS — I700 Atherosclerosis of aorta: Secondary | ICD-10-CM | POA: Diagnosis not present

## 2022-03-15 DIAGNOSIS — Z0000 Encounter for general adult medical examination without abnormal findings: Secondary | ICD-10-CM

## 2022-03-15 DIAGNOSIS — Z79899 Other long term (current) drug therapy: Secondary | ICD-10-CM | POA: Diagnosis not present

## 2022-03-15 DIAGNOSIS — Z0001 Encounter for general adult medical examination with abnormal findings: Secondary | ICD-10-CM | POA: Diagnosis not present

## 2022-03-15 DIAGNOSIS — J449 Chronic obstructive pulmonary disease, unspecified: Secondary | ICD-10-CM

## 2022-03-15 DIAGNOSIS — Z681 Body mass index (BMI) 19 or less, adult: Secondary | ICD-10-CM

## 2022-03-15 DIAGNOSIS — E782 Mixed hyperlipidemia: Secondary | ICD-10-CM

## 2022-03-15 DIAGNOSIS — M810 Age-related osteoporosis without current pathological fracture: Secondary | ICD-10-CM

## 2022-03-15 DIAGNOSIS — Z86010 Personal history of colonic polyps: Secondary | ICD-10-CM

## 2022-03-15 DIAGNOSIS — E46 Unspecified protein-calorie malnutrition: Secondary | ICD-10-CM

## 2022-03-15 NOTE — Patient Instructions (Signed)

## 2022-03-15 NOTE — Progress Notes (Signed)
MEDICARE ANNUAL WELLNESS VISIT AND FOLLOW UP  Assessment:   Annual Medicare annual wellness visit Due annually  Health maintenance reviewed  Aortic Atherosclerosis(HCC) Monitor blood sugars, cholesterol, weight and blood pressure  Hypothyroidism, unspecified hypothyroidism type Controlled. Continue Levothyroxine. Reminded to take on an empty stomach 30-82mns before food.  Stop any Biotin Supplement 48-72 hours before next TSH level to reduce the risk of falsely low TSH levels. Continue to monitor.     Labile hypertension Discussed DASH (Dietary Approaches to Stop Hypertension) DASH diet is lower in sodium than a typical American diet. Cut back on foods that are high in saturated fat, cholesterol, and trans fats. Eat more whole-grain foods, fish, poultry, and nuts Remain active and exercise as tolerated daily.  Monitor BP at home-Call if greater than 130/80.  Check CMP/CBC  Hyperlipidemia Discussed lifestyle modifications. Recommended diet heavy in fruits and veggies, omega 3's. Decrease consumption of animal meats, cheeses, and dairy products. Remain active and exercise as tolerated. Continue to monitor. Check lipids/TSH   Medication management All medications discussed and reviewed in full. All questions and concerns regarding medications addressed.     Chronic obstructive pulmonary disease, unspecified COPD type ([place] Former smoker, no active sx, cont to monitor No current treatment needed  Osteoporosis Bone Density scan UTD- 03/25/21 Left Forearm T score -3.5 Discontinued Fosomax 1 year ago - Continue Prolia Pursue a combination of weight-bearing exercises and strength training. Patients with severe mobility impairment should be referred for physical therapy. Advised on fall prevention measures including proper lighting in all rooms, removal of area rugs and floor clutter, use of walking devices as deemed appropriate, avoidance of uneven walking surfaces. Smoking  cessation and moderate alcohol consumption if applicable Consume 8938to 1000 IU of vitamin D daily with a goal vitamin D serum value of 30 ng/mL or higher. Aim for 1000 to 1200 mg of elemental calcium daily through supplements and/or dietary sources.   Abnormal Glucose Education: Reviewed 'ABCs' of diabetes management  Discussed goals to be met and/or maintained include A1C (<7) Blood pressure (<130/80) Cholesterol (LDL <70) Continue Eye Exam yearly  Continue Dental Exam Q6 mo Discussed dietary recommendations Discussed Physical Activity recommendations Foot exam UTD Check A1C    Vitamin D deficiency At goal at recent check; continue to recommend supplementation for goal of 60-100 Monitor levels  Personal history of colon polyps Completed colonoscopy 01/2022 Can now f/u Q2 years.  Glaucoma  Ophth is managing Continue to monitor   Mild protein/calorie malnutrition (HCC)/BMI <19 Discussed appropriate BMI Diet modification. Physical activity. Encouraged/praised to build confidence.  Orders Placed This Encounter  Procedures   CBC with Differential/Platelet   COMPLETE METABOLIC PANEL WITH GFR   Hemoglobin A1c   TSH   Lipid panel   VITAMIN D 25 Hydroxy (Vit-D Deficiency, Fractures)    Notify office for further evaluation and treatment, questions or concerns if any reported s/s fail to improve.   The patient was advised to call back or seek an in-person evaluation if any symptoms worsen or if the condition fails to improve as anticipated.   Further disposition pending results of labs. Discussed med's effects and SE's.    I discussed the assessment and treatment plan with the patient. The patient was provided an opportunity to ask questions and all were answered. The patient agreed with the plan and demonstrated an understanding of the instructions.  Discussed med's effects and SE's. Screening labs and tests as requested with regular follow-up as recommended.  I  provided  40 minutes of face-to-face time during this encounter including counseling, chart review, and critical decision making was preformed.   Future Appointments  Date Time Provider [place]  05/11/2022 12:30 PM [facility] 2 [facility]  07/30/2022 11:00 AM Unk Pinto, MD [facility] None     Plan:   During the course of the visit the patient was educated and counseled about appropriate screening and preventive services including:   Pneumococcal vaccine  Prevnar 13 Influenza vaccine Td vaccine Screening electrocardiogram Bone densitometry screening Colorectal cancer screening Diabetes screening Glaucoma screening Nutrition counseling  Advanced directives: requested   Subjective:  Katie Morton is a 77 y.o. female who presents for Medicare Annual Wellness Visit and 3 month follow up.   Overall she reports feeling well.  She has no new concerns to report in clinic today.  Follows annually with Dr. Jari Pigg for history of SCC.   Recently underwent colonoscopy 01/2022 by Dr. Stephanie Acre at [facility] due to 20+ polyps, and found to be able to extend screening to Q2 years.   She is following with Dr. Ander Slade at [facility] for glaucoma, stable.   BMI is Body mass index is 18.64 kg/m., she has been working on diet and exercise, has had low BMI all her life, walks 45 min most days.  Has gained some weight over the past 3 months.  Wt Readings from Last 3 Encounters:  03/15/22 120 lb 12.8 oz (54.8 kg)  12/10/21 118 lb (53.5 kg)  09/07/21 116 lb 12.8 oz (53 kg)    She has BP cuff but hasn't been checking recently, today their BP is BP: 128/66   BP Readings from Last 3 Encounters:  03/15/22 128/66  12/10/21 138/68  09/07/21 (!) 112/50  historically labile She does workout. She denies chest pain, shortness of breath, dizziness.   She is not on cholesterol medication (taking omega 3 supplement, was prescribed rosuvastatin and zetia but never started per personal  preference, and denies myalgias. Her cholesterol is not at goal. The cholesterol last visit was:   Lab Results  Component Value Date   CHOL 185 12/10/2021   HDL 77 12/10/2021   LDLCALC 87 12/10/2021   TRIG 118 12/10/2021   CHOLHDL 2.4 12/10/2021    She has been working on diet and exercise for hx of prediabetes recently well controlled, and denies increased appetite, nausea, paresthesia of the feet, polydipsia, polyuria and visual disturbances. Last A1C in the office was:  Lab Results  Component Value Date   HGBA1C 6.0 (H) 12/10/2021   She is on thyroid medication. She has personally changed her dosage to 12.5 mcg from 25 mcg daily. She denies any SE.  Lab Results  Component Value Date   TSH 4.38 12/10/2021   Last GFR: Lab Results  Component Value Date   GFRNONAA 93 06/02/2020   Patient is on Vitamin D supplement.   Lab Results  Component Value Date   VD25OH 86 12/10/2021      Medication Review: Current Outpatient Medications on File Prior to Visit  Medication Sig Dispense Refill   aspirin EC 81 MG tablet Take 81 mg by mouth daily.     Calcium Citrate-Vitamin D (CALCIUM CITRATE + D PO) Take by mouth daily.     Cholecalciferol (VITAMIN D PO) Take 2,000 Units by mouth daily.     CINNAMON PO Take 1,000 mg by mouth 2 (two) times daily.     denosumab (PROLIA) 60 MG/ML SOSY injection Inject 60 mg into the  skin every 6 (six) months.     dorzolamide (TRUSOPT) 2 % ophthalmic solution 3 (three) times daily.  11   fish oil-omega-3 fatty acids 1000 MG capsule Take 1 g by mouth daily.      levothyroxine (SYNTHROID) 50 MCG tablet TAKE 1/2 TABLET DAILY ON AN EMPTY STOMACH FOR 30 MIN 45 tablet 3   Multiple Vitamins-Minerals (MULTIVITAMIN WITH MINERALS) tablet Take 1 tablet by mouth daily.     Netarsudil-Latanoprost (ROCKLATAN) 0.02-0.005 % SOLN Place 1 drop into the left eye daily.     rosuvastatin (CRESTOR) 10 MG tablet TAKE 1 TABLET BY MOUTH EVERY DAY FOR CHOLESTEROL 90 tablet 3    rosuvastatin (CRESTOR) 5 MG tablet Take 1 tablet Daily for Cholesterol 90 tablet 3   VITAMIN K PO Take by mouth.     Ascorbic Acid (VITAMIN C PO) Take 1 tablet by mouth daily. (Patient not taking: Reported on 03/15/2022)     No current facility-administered medications on file prior to visit.    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   Personal history of colonic polyps 07/25/2019   History of squamous cell carcinoma 07/25/2019   Protein-calorie malnutrition ([place]) 02/28/2018   BMI less than 19,adult 01/23/2015   Hypothyroidism 06/11/2014   Medication management 01/01/2013   Vitamin D deficiency 01/01/2013   Hyperlipidemia    Abnormal glucose    Osteoporosis 09/20/2012   COPD (chronic obstructive pulmonary disease) ([place])     Screening Tests Immunization History  Administered Date(s) Administered   Influenza, High Dose Seasonal PF 01/02/2013, 01/07/2014, 01/23/2015, 12/01/2015, 01/19/2017, 12/29/2017, 12/20/2018, 01/02/2020, 01/28/2021, 12/10/2021   Influenza-Unspecified 12/16/2011, 12/23/2016, 01/23/2017   PFIZER(Purple Top)SARS-COV-2 Vaccination 04/02/2019, 04/27/2019, 12/20/2019   PNEUMOCOCCAL CONJUGATE-20 01/28/2021   Pneumococcal Conjugate-13 01/07/2014   Pneumococcal Polysaccharide-23 08/07/2015   Pneumococcal-Unspecified 02/23/1995   Td 02/22/2005, 07/28/2016   Zoster, Live 12/16/2011    Preventative care: Last colonoscopy: 01/2022 per Dr. Stephanie Acre at [facility] F/U 2 years. Last mammogram: 03/25/21 Negative DEXA: 08/2016, started on fosamax x 2015, stopped early 2022, DEXA 11/2018 with radius T score -3.3, restarted fosamax 11/2018, no no longer taking Foxamax.  DEXA 03/25/21 -3.5 and  started Prolia  Prior vaccinations: TD or Tdap: 2018 Influenza: 12/2021 Pneumococcal: 2022 Prevnar13: 2015 Shingles/Zostavax: 2013 - check with insurance  Covid 19: 2/2, 2021, pfizer + booster 02/2022  Names of Other Physician/Practitioners you currently use: 1. [facility]- here for primary care 2. Dr. Leanord Asal, eye doctor, last visit 08/20/21, monitoring cataract and glaucoma 3. Dr. Gilford Rile, dentist, last visit 07/2021, goes q62m Patient Care Team: MUnk Pinto MD as PCP - General (Internal Medicine) ELaurence Spates MD (Inactive) as Consulting Physician (Gastroenterology) VConstance Holster DDS GJari Pigg MD as Consulting Physician (Dermatology) GRoseanne Kaufman MD as Consulting Physician (Orthopedic Surgery)  SURGICAL HISTORY She  has a past surgical history that includes Tonsillectomy; Mandible surgery; and Hip pinning, cannulated (Left, 09/19/2012). FAMILY HISTORY Her family history includes CAD in her maternal grandfather and paternal grandfather; COPD in her father; Cancer in her maternal aunt and paternal aunt; Cancer - Other in her father; Diabetes in her mother; Heart disease in her brother and mother; Hypertension in her mother; Mitral  valve prolapse in her mother; Stroke in her father. SOCIAL HISTORY She  reports that she quit smoking about 44 years ago. Her smoking use included cigarettes. She has never used smokeless tobacco. She reports current alcohol use of about 14.0 standard drinks of alcohol per week. She reports that she does not use drugs.   MEDICARE WELLNESS OBJECTIVES: Physical activity:   Cardiac risk factors:   Depression/mood screen:      03/15/2022    3:10 PM  Depression screen PHQ 2/9  Decreased Interest 0  Down, Depressed, Hopeless 0  PHQ - 2 Score 0    ADLs:     03/15/2022    3:07 PM 12/12/2021    9:41 PM  In your present state of health, do you have any difficulty performing the following activities:   Hearing? 0 0  Vision? 0 0  Comment Surgery on Right Eye, has new contacts, new glasses   Difficulty concentrating or making decisions? 0 0  Walking or climbing stairs? 0 0  Dressing or bathing? 0 0  Doing errands, shopping? 0 0  Preparing Food and eating ? N   Using the Toilet? N   In the past six months, have you accidently leaked urine? N   Do you have problems with loss of bowel control? N   Managing your Medications? N   Managing your Finances? N   Housekeeping or managing your Housekeeping? N      Cognitive Testing  Alert? Yes  Normal Appearance?Yes  Oriented to person? Yes  Place? Yes   Time? Yes  Recall of three objects?  Yes  Can perform simple calculations? Yes  Displays appropriate judgment?Yes  Can read the correct time from a watch face?Yes  EOL planning: Does Patient Have a Medical Advance Directive?: No  Review of Systems  Constitutional:  Negative for malaise/fatigue and weight loss.  HENT:  Negative for hearing loss and tinnitus.   Eyes:  Negative for blurred vision and double vision.  Respiratory:  Negative for cough, shortness of breath and wheezing.   Cardiovascular:  Negative for chest pain, palpitations, orthopnea, claudication and leg swelling.  Gastrointestinal:  Negative for abdominal pain, blood in stool, constipation, diarrhea, heartburn, melena, nausea and vomiting.  Genitourinary: Negative.   Musculoskeletal:  Negative for falls, joint pain (right hand pain intermittently, weak grip) and myalgias.  Skin:  Negative for rash.       Cutaneous horns left forearm, right hand  Neurological:  Negative for dizziness, tingling, sensory change, weakness and headaches.  Endo/Heme/Allergies:  Negative for polydipsia.  Psychiatric/Behavioral: Negative.    All other systems reviewed and are negative.    Objective:     Today's Vitals   03/15/22 1441  BP: 128/66  Pulse: (!) 58  Temp: (!) 97.2 F (36.2 C)  SpO2: 99%  Weight: 120 lb 12.8 oz (54.8 kg)   Height: 5' 7.5" (1.715 m)    Body mass index is 18.64 kg/m.  General appearance: alert, no distress, WD/WN, female HEENT: normocephalic, sclerae anicteric, TMs pearly, nares patent, no discharge or erythema, pharynx normal Oral cavity: MMM, no lesions Neck: supple, no lymphadenopathy, no thyromegaly, no masses Heart: RRR, normal S1, S2, no murmurs Lungs: CTA bilaterally, no wheezes, rhonchi, or rales Abdomen: +bs, soft, non tender, non distended, no masses, no hepatomegaly, no splenomegaly Musculoskeletal: nontender, no swelling; she has reduced ROM of R 1st digit base with mild contracture  Extremities: no edema, no cyanosis, no clubbing Pulses: 2+ symmetric, upper and lower extremities,  normal cap refill Neurological: alert, oriented x 3, CN2-12 intact, strength normal upper extremities and lower extremities, sensation normal throughout, DTRs 2+ throughout, no cerebellar signs, gait normal Psychiatric: normal affect, behavior normal, pleasant  Skin : < 1 cm cutaneous horn on right hand and left froearm Liquid nitrogen was applied for 10-12 seconds to the skin lesions and the expected blistering or scabbing reaction explained. Do not pick at the areas. Patient reminded to expect hypopigmented scars from the procedure. Return if lesions fail to fully resolve.   Medicare Attestation I have personally reviewed: The patient's medical and social history Their use of alcohol, tobacco or illicit drugs Their current medications and supplements The patient's functional ability including ADLs,fall risks, home safety risks, cognitive, and hearing and visual impairment Diet and physical activities Evidence for depression or mood disorders  The patient's weight, height, BMI, and visual acuity have been recorded in the chart.  I have made referrals, counseling, and provided education to the patient based on review of the above and I have provided the patient with a written personalized care plan  for preventive services.     Darrol Jump, NP   03/15/2022

## 2022-03-16 ENCOUNTER — Encounter: Payer: Self-pay | Admitting: Internal Medicine

## 2022-03-16 LAB — CBC WITH DIFFERENTIAL/PLATELET
Absolute Monocytes: 774 cells/uL (ref 200–950)
Basophils Absolute: 89 cells/uL (ref 0–200)
Basophils Relative: 1 %
Eosinophils Absolute: 320 cells/uL (ref 15–500)
Eosinophils Relative: 3.6 %
HCT: 39.1 % (ref 35.0–45.0)
Hemoglobin: 13.2 g/dL (ref 11.7–15.5)
Lymphs Abs: 2003 cells/uL (ref 850–3900)
MCH: 30.2 pg (ref 27.0–33.0)
MCHC: 33.8 g/dL (ref 32.0–36.0)
MCV: 89.5 fL (ref 80.0–100.0)
MPV: 11.7 fL (ref 7.5–12.5)
Monocytes Relative: 8.7 %
Neutro Abs: 5714 cells/uL (ref 1500–7800)
Neutrophils Relative %: 64.2 %
Platelets: 283 10*3/uL (ref 140–400)
RBC: 4.37 10*6/uL (ref 3.80–5.10)
RDW: 11.9 % (ref 11.0–15.0)
Total Lymphocyte: 22.5 %
WBC: 8.9 10*3/uL (ref 3.8–10.8)

## 2022-03-16 LAB — COMPLETE METABOLIC PANEL WITH GFR
AG Ratio: 1.3 (calc) (ref 1.0–2.5)
ALT: 17 U/L (ref 6–29)
AST: 15 U/L (ref 10–35)
Albumin: 4.3 g/dL (ref 3.6–5.1)
Alkaline phosphatase (APISO): 62 U/L (ref 37–153)
BUN/Creatinine Ratio: 25 (calc) — ABNORMAL HIGH (ref 6–22)
BUN: 12 mg/dL (ref 7–25)
CO2: 28 mmol/L (ref 20–32)
Calcium: 9.8 mg/dL (ref 8.6–10.4)
Chloride: 102 mmol/L (ref 98–110)
Creat: 0.48 mg/dL — ABNORMAL LOW (ref 0.60–1.00)
Globulin: 3.2 g/dL (calc) (ref 1.9–3.7)
Glucose, Bld: 71 mg/dL (ref 65–99)
Potassium: 4.2 mmol/L (ref 3.5–5.3)
Sodium: 139 mmol/L (ref 135–146)
Total Bilirubin: 0.4 mg/dL (ref 0.2–1.2)
Total Protein: 7.5 g/dL (ref 6.1–8.1)
eGFR: 99 mL/min/{1.73_m2} (ref >=60)

## 2022-03-16 LAB — HEMOGLOBIN A1C
Hgb A1c MFr Bld: 6 % of total Hgb — ABNORMAL HIGH (ref <5.7)
Mean Plasma Glucose: 126 mg/dL
eAG (mmol/L): 7 mmol/L

## 2022-03-16 LAB — VITAMIN D 25 HYDROXY (VIT D DEFICIENCY, FRACTURES): Vit D, 25-Hydroxy: 83 ng/mL (ref 30–100)

## 2022-03-16 LAB — LIPID PANEL
Cholesterol: 163 mg/dL (ref <200)
HDL: 59 mg/dL (ref >=50)
LDL Cholesterol (Calc): 76 mg/dL (calc)
Non-HDL Cholesterol (Calc): 104 mg/dL (calc) (ref <130)
Total CHOL/HDL Ratio: 2.8 (calc) (ref <5.0)
Triglycerides: 188 mg/dL — ABNORMAL HIGH (ref <150)

## 2022-03-16 LAB — TSH: TSH: 4.38 mIU/L (ref 0.40–4.50)

## 2022-03-17 DIAGNOSIS — H401221 Low-tension glaucoma, left eye, mild stage: Secondary | ICD-10-CM | POA: Diagnosis not present

## 2022-03-17 DIAGNOSIS — H401213 Low-tension glaucoma, right eye, severe stage: Secondary | ICD-10-CM | POA: Diagnosis not present

## 2022-03-17 DIAGNOSIS — H25813 Combined forms of age-related cataract, bilateral: Secondary | ICD-10-CM | POA: Diagnosis not present

## 2022-05-11 ENCOUNTER — Ambulatory Visit
Admission: RE | Admit: 2022-05-11 | Discharge: 2022-05-11 | Disposition: A | Discharge status: Home / Self Care | Payer: Medicare PPO | Source: Ambulatory Visit | Attending: Internal Medicine | Admitting: Internal Medicine

## 2022-05-11 DIAGNOSIS — Z1231 Encounter for screening mammogram for malignant neoplasm of breast: Secondary | ICD-10-CM

## 2022-05-11 DIAGNOSIS — L570 Actinic keratosis: Secondary | ICD-10-CM | POA: Diagnosis not present

## 2022-05-11 DIAGNOSIS — D1721 Benign lipomatous neoplasm of skin and subcutaneous tissue of right arm: Secondary | ICD-10-CM | POA: Diagnosis not present

## 2022-05-14 DIAGNOSIS — E559 Vitamin D deficiency, unspecified: Secondary | ICD-10-CM | POA: Diagnosis not present

## 2022-05-14 DIAGNOSIS — E039 Hypothyroidism, unspecified: Secondary | ICD-10-CM | POA: Diagnosis not present

## 2022-05-14 DIAGNOSIS — I10 Essential (primary) hypertension: Secondary | ICD-10-CM | POA: Diagnosis not present

## 2022-05-14 DIAGNOSIS — M810 Age-related osteoporosis without current pathological fracture: Secondary | ICD-10-CM | POA: Diagnosis not present

## 2022-06-07 ENCOUNTER — Encounter: Payer: Medicare PPO | Admitting: Internal Medicine

## 2022-07-29 ENCOUNTER — Encounter: Payer: Self-pay | Admitting: Internal Medicine

## 2022-07-29 NOTE — Patient Instructions (Signed)

## 2022-07-29 NOTE — Progress Notes (Signed)
`1    Annual Screening/Preventative Visit & Comprehensive Evaluation &  Examination   Future Appointments  Date Time Provider Department  07/30/2022                       cpe 11:00 AM Lucky Cowboy, MD [facility]  03/23/2023                     wellness  2:30 PM Adela Glimpse, NP [facility]  08/16/2023                     cpe 11:00 AM Lucky Cowboy, MD [facility]          This very nice 77 y.o. MWF  with  HTN, HLD, Hypothyroidism,  Prediabetes  and Vitamin D Deficiency presents for a Screening /Preventative Visit & comprehensive evaluation and management of multiple medical co-morbidities.  Chest CT in Aug  & Dec 2022 showed Coronary artery calcification & Aortic Atherosclerosis.  Pulmonary nodule also monitored appeared stable over the last 10 years since 2012.          Patient has hx/o Osteoporosis and had been off of Fosamax from 2019 until Oct 2020 when f/u  dexaBMD   showed worsening with T -3.3 and Fosamax was restarted & now after  BMD 03/25/2021  Fosamax was  recently stopped by Dr Horald Pollen. Patient was apparently recommended Prolia q 6 mo.         HTN predates since  2012 .  CT  Coronary artery calcium score was 43.4  at percentile 53  in August 2022. Patient's BP has been controlled at home and patient denies any cardiac symptoms as chest pain, palpitations, shortness of breath, dizziness or ankle swelling. Today's BP was initially slightly elevated & rechecked at goal  - 132/76 .        Patient's hyperlipidemia is controlled with diet and Rosuvastatin. Patient denies myalgias or other medication SE's. Last lipids were at goal :  Lab Results  Component Value Date   CHOL 163 03/15/2022   HDL 59 03/15/2022   LDLCALC 76 03/15/2022   TRIG 188 (H) 03/15/2022   CHOLHDL 2.8 03/15/2022         Patient has hx/o prediabetes  (A1c 5.7% /2012 & 6.0% /2016).  Patient denies reactive hypoglycemic symptoms, visual blurring, diabetic polys or paresthesias. Last A1c was near goal  :  Lab Results  Component Value Date   HGBA1C 6.0 (H) 03/15/2022                                             Patient was dx'd Hypothyroid  in 2015 & was started on Thyroid Replacement.          Finally, patient has history of Vitamin D Deficiency and last Vitamin D was at goal :  Lab Results  Component Value Date   VD25OH 83 03/15/2022       Current Outpatient Medications on File Prior to Visit  Medication Sig   alendronate 70 MG tablet TAKE 1 TABLET ONCE WEEKLY    VITAMIN C  Take 1 tablet  daily.   aspirin EC 81 MG tablet Take  daily.   Calcium Citrate-Vitamin D  Take  daily.   VITAMIN D 2,000 Units  Take  daily.   CINNAMON   1,000 mg  Take  2  times daily.   TRUSOPT  2 % ophth soln Use 3 times daily.   fish oil-omega-3 1000 MG capsule Take daily.    levothyroxine 50 MCG tablet TAKE 1/2 TABLET DAILY    Multiple Vitamins-Minerals  Take 1 tablet daily.   Netarsudil-Latanoprost (ROCKLATAN) 0.02-0.005 % SOLN Place 1 drop into both eyes daily.   rosuvastatin  5 MG tablet Take 1 tablet Daily for Cholesterol     Allergies  Allergen Reactions   Brimonidine Itching   Silver Sulfadiazine      Past Medical History:  Diagnosis Date   Arthritis    Cancer (HCC) 2016   squamous cell skin cancer    COPD (chronic obstructive pulmonary disease) (HCC)    Elevated hemoglobin A1c    Femur fracture (HCC) 09/20/2012   Hyperlipidemia    Labile hypertension    Osteoporosis    Villous adenoma 2008     Health Maintenance  Topic Date Due   Zoster Vaccines- Shingrix (1 of 2) Never done   COVID-19 Vaccine (4 - Booster for Pfizer series) 02/14/2020   INFLUENZA VACCINE  09/22/2021   MAMMOGRAM  03/26/2023   DEXA SCAN  03/26/2023   TETANUS/TDAP  07/29/2026   Pneumonia Vaccine 53+ Years old  Completed   Hepatitis C Screening  Completed   HPV VACCINES  Aged Out     Immunization History  Administered Date(s) Administered   Influenza, High Dose  12/29/2017, 12/20/2018,  01/02/2020, 01/28/2021   Influenza 12/16/2011, 12/23/2016, 01/23/2017   PFIZER SARS-COV-2 Vacc 04/02/2019, 04/27/2019, 12/20/2019   PNEUMO CONJUGATE-20 01/28/2021   Pneumococcal - 13 01/07/2014   Pneumococcal - 23 08/07/2015   Pneumococcal - 23 02/23/1995   Td 02/22/2005, 07/28/2016   Zoster, Live 12/16/2011     Last Colon - 02/17/2022 - Dr Josetta Huddle [facility]  with polyps &                                                           recommended  2 year f/u due Dec 2025.    Last MGM - 05/12/2022  Last dexaBMD -  03/25/2021  - Osteoporosis - on Prolia - Recc   2 year  f/u Feb 2025    Past Surgical History:  Procedure Laterality Date   HIP PINNING,CANNULATED Left 09/19/2012   Percutaneous Screw Fixation Left Hip Fracture;   Mable Paris, MD   MANDIBLE SURGERY     TONSILLECTOMY       Family History  Problem Relation Age of Onset   Hypertension Mother    Diabetes Mother    Mitral valve prolapse Mother    Heart disease Mother    Cancer - Other Father        Leukemia and Melanoma   COPD Father    Stroke Father    CAD Paternal Grandfather    CAD Maternal Grandfather    Heart disease Brother    Cancer Maternal Aunt        colon   Cancer Paternal Aunt        colon     Social History   Tobacco Use   Smoking status: Former    Types: Cigarettes    Quit date: 02/22/1978    Years since quitting: 43.3   Smokeless tobacco: Never  Substance Use Topics  Alcohol use: Yes    Alcohol/week: 14.0 standard drinks    Types: 14 Standard drinks or equivalent per week   Drug use: No      ROS Constitutional: Denies fever, chills, weight loss/gain, headaches, insomnia,  night sweats, and change in appetite. Does c/o fatigue. Eyes: Denies redness, blurred vision, diplopia, discharge, itchy, watery eyes.  ENT: Denies discharge, congestion, post nasal drip, epistaxis, sore throat, earache, hearing loss, dental pain, Tinnitus, Vertigo, Sinus pain, snoring.  Cardio: Denies chest  pain, palpitations, irregular heartbeat, syncope, dyspnea, diaphoresis, orthopnea, PND, claudication, edema Respiratory: denies cough, dyspnea, DOE, pleurisy, hoarseness, laryngitis, wheezing.  Gastrointestinal: Denies dysphagia, heartburn, reflux, water brash, pain, cramps, nausea, vomiting, bloating, diarrhea, constipation, hematemesis, melena, hematochezia, jaundice, hemorrhoids Genitourinary: Denies dysuria, frequency, urgency, nocturia, hesitancy, discharge, hematuria, flank pain Breast: Breast lumps, nipple discharge, bleeding.  Musculoskeletal: Denies arthralgia, myalgia, stiffness, Jt. Swelling, pain, limp, and strain/sprain. Denies falls. Skin: Denies puritis, rash, hives, warts, acne, eczema, changing in skin lesion Neuro: No weakness, tremor, incoordination, spasms, paresthesia, pain Psychiatric: Denies confusion, memory loss, sensory loss. Denies Depression. Endocrine: Denies change in weight, skin, hair change, nocturia, and paresthesia, diabetic polys, visual blurring, hyper / hypo glycemic episodes.  Heme/Lymph: No excessive bleeding, bruising, enlarged lymph nodes.  Physical Exam  BP 132/76   Pulse 61   Temp 97.9 F (36.6 C)   Resp 16   Ht 5' 7.5" (1.715 m)   Wt 123 lb 6.4 oz (56 kg)   SpO2 99%   BMI 19.04 kg/m   General Appearance: Slender,  well groomed and in no apparent distress.  Eyes: PERRLA, EOMs, conjunctiva no swelling or erythema, normal fundi and vessels. Sinuses: No frontal/maxillary tenderness ENT/Mouth: EACs patent / TMs  nl. Nares clear without erythema, swelling, mucoid exudates. Oral hygiene is good. No erythema, swelling, or exudate. Tongue normal, non-obstructing. Tonsils not swollen or erythematous. Hearing normal.  Neck: Supple, thyroid not palpable. No bruits, nodes or JVD. Respiratory: Respiratory effort normal.  BS equal and clear bilateral without rales, rhonci, wheezing or stridor. Cardio: Heart sounds are normal with regular rate and rhythm  and no murmurs, rubs or gallops. Peripheral pulses are normal and equal bilaterally without edema. No aortic or femoral bruits. Chest: symmetric with normal excursions and percussion. Breasts: Symmetric, without lumps, nipple discharge, retractions, or fibrocystic changes.  Abdomen: Flat, soft with bowel sounds active. Nontender, no guarding, rebound, hernias, masses, or organomegaly.  Lymphatics: Non tender without lymphadenopathy. \ Musculoskeletal: Full ROM all peripheral extremities, joint stability, 5/5 strength, and normal gait. Skin: Warm and dry without rashes, lesions, cyanosis, clubbing or  ecchymosis.  Neuro: Cranial nerves intact, reflexes equal bilaterally. Normal muscle tone, no cerebellar symptoms. Sensation intact.  Pysch: Alert and oriented x  3, normal affect, Insight and Judgment appropriate.    Assessment and Plan  1. Annual Preventative Screening Examination   2. Labile hypertension  - EKG 12-Lead - Urinalysis, Routine w reflex microscopic - Microalbumin / creatinine urine ratio - CBC with Differential/Platelet - COMPLETE METABOLIC PANEL WITH GFR - Magnesium - TSH  3. Hyperlipidemia, mixed  - EKG 12-Lead - Lipid panel - TSH  4. Abnormal glucose  - EKG 12-Lead - Hemoglobin A1c - Insulin, random  5. Vitamin D deficiency  - VITAMIN D 25 Hydroxy   6. Hypothyroidism  - TSH  7. Aortic atherosclerosis (HCC) - per CT 09/2020  - EKG 12-Lead - Lipid panel  8. Coronary artery calcification - LAD CCS 43.4 09/2020  - EKG 12-Lead -  Lipid panel  9. Screening for heart disease  - EKG 12-Lead  10. FHx: heart disease  - EKG 12-Lead  11. Age-related osteoporosis without current pathological fracture  - COMPLETE METABOLIC PANEL WITH GFR - TSH  12. Former smoker  - EKG 12-Lead  13. Medication management  - Urinalysis, Routine w reflex microscopic - Microalbumin / creatinine urine ratio - CBC with Differential/Platelet - COMPLETE METABOLIC  PANEL WITH GFR - Magnesium - Lipid panel - TSH - Hemoglobin A1c - Insulin, random - VITAMIN D 25 Hydroxy          Patient was counseled in prudent diet to achieve/maintain BMI less than 25 for weight control, BP monitoring, regular exercise and medications. Discussed med's effects and SE's. Screening labs and tests as requested with regular follow-up as recommended. Over 40 minutes of exam, counseling, chart review and high complex critical decision making was performed.   Marinus Maw, MD

## 2022-07-30 ENCOUNTER — Encounter: Payer: Self-pay | Admitting: Internal Medicine

## 2022-07-30 ENCOUNTER — Ambulatory Visit (INDEPENDENT_AMBULATORY_CARE_PROVIDER_SITE_OTHER): Payer: Medicare PPO | Admitting: Internal Medicine

## 2022-07-30 VITALS — BP 132/76 | HR 61 | Temp 97.9°F | Resp 16 | Ht 67.5 in | Wt 123.4 lb

## 2022-07-30 DIAGNOSIS — E782 Mixed hyperlipidemia: Secondary | ICD-10-CM

## 2022-07-30 DIAGNOSIS — E039 Hypothyroidism, unspecified: Secondary | ICD-10-CM | POA: Diagnosis not present

## 2022-07-30 DIAGNOSIS — Z79899 Other long term (current) drug therapy: Secondary | ICD-10-CM

## 2022-07-30 DIAGNOSIS — M810 Age-related osteoporosis without current pathological fracture: Secondary | ICD-10-CM

## 2022-07-30 DIAGNOSIS — R7309 Other abnormal glucose: Secondary | ICD-10-CM

## 2022-07-30 DIAGNOSIS — Z8249 Family history of ischemic heart disease and other diseases of the circulatory system: Secondary | ICD-10-CM

## 2022-07-30 DIAGNOSIS — I700 Atherosclerosis of aorta: Secondary | ICD-10-CM | POA: Diagnosis not present

## 2022-07-30 DIAGNOSIS — Z87891 Personal history of nicotine dependence: Secondary | ICD-10-CM

## 2022-07-30 DIAGNOSIS — R0989 Other specified symptoms and signs involving the circulatory and respiratory systems: Secondary | ICD-10-CM | POA: Diagnosis not present

## 2022-07-30 DIAGNOSIS — Z136 Encounter for screening for cardiovascular disorders: Secondary | ICD-10-CM | POA: Diagnosis not present

## 2022-07-30 DIAGNOSIS — Z0000 Encounter for general adult medical examination without abnormal findings: Secondary | ICD-10-CM | POA: Diagnosis not present

## 2022-07-30 DIAGNOSIS — E559 Vitamin D deficiency, unspecified: Secondary | ICD-10-CM

## 2022-07-30 DIAGNOSIS — Z1211 Encounter for screening for malignant neoplasm of colon: Secondary | ICD-10-CM

## 2022-07-30 DIAGNOSIS — Z0001 Encounter for general adult medical examination with abnormal findings: Secondary | ICD-10-CM

## 2022-07-31 LAB — HEMOGLOBIN A1C: Mean Plasma Glucose: 126 mg/dL

## 2022-07-31 LAB — LIPID PANEL: Triglycerides: 110 mg/dL (ref <150)

## 2022-08-01 ENCOUNTER — Encounter: Payer: Self-pay | Admitting: Internal Medicine

## 2022-08-02 LAB — COMPLETE METABOLIC PANEL WITH GFR
AG Ratio: 1.5 (calc) (ref 1.0–2.5)
ALT: 20 U/L (ref 6–29)
AST: 18 U/L (ref 10–35)
Albumin: 4.3 g/dL (ref 3.6–5.1)
Alkaline phosphatase (APISO): 60 U/L (ref 37–153)
BUN/Creatinine Ratio: 25 (calc) — ABNORMAL HIGH (ref 6–22)
BUN: 13 mg/dL (ref 7–25)
CO2: 29 mmol/L (ref 20–32)
Calcium: 9.1 mg/dL (ref 8.6–10.4)
Chloride: 98 mmol/L (ref 98–110)
Creat: 0.53 mg/dL — ABNORMAL LOW (ref 0.60–1.00)
Globulin: 2.8 g/dL (calc) (ref 1.9–3.7)
Glucose, Bld: 127 mg/dL — ABNORMAL HIGH (ref 65–99)
Potassium: 4.3 mmol/L (ref 3.5–5.3)
Sodium: 134 mmol/L — ABNORMAL LOW (ref 135–146)
Total Bilirubin: 0.6 mg/dL (ref 0.2–1.2)
Total Protein: 7.1 g/dL (ref 6.1–8.1)
eGFR: 96 mL/min/{1.73_m2} (ref >=60)

## 2022-08-02 LAB — URINALYSIS, ROUTINE W REFLEX MICROSCOPIC
Bilirubin Urine: NEGATIVE
Glucose, UA: NEGATIVE
Hgb urine dipstick: NEGATIVE
Ketones, ur: NEGATIVE
Leukocytes,Ua: NEGATIVE
Nitrite: NEGATIVE
Protein, ur: NEGATIVE
Specific Gravity, Urine: 1.003 (ref 1.001–1.035)
pH: 7 (ref 5.0–8.0)

## 2022-08-02 LAB — CBC WITH DIFFERENTIAL/PLATELET
Absolute Monocytes: 679 cells/uL (ref 200–950)
Basophils Absolute: 69 cells/uL (ref 0–200)
Basophils Relative: 0.8 %
Eosinophils Absolute: 292 cells/uL (ref 15–500)
Eosinophils Relative: 3.4 %
HCT: 39.4 % (ref 35.0–45.0)
Hemoglobin: 13 g/dL (ref 11.7–15.5)
Lymphs Abs: 1522 cells/uL (ref 850–3900)
MCH: 30 pg (ref 27.0–33.0)
MCHC: 33 g/dL (ref 32.0–36.0)
MCV: 91 fL (ref 80.0–100.0)
MPV: 11.3 fL (ref 7.5–12.5)
Monocytes Relative: 7.9 %
Neutro Abs: 6037 cells/uL (ref 1500–7800)
Neutrophils Relative %: 70.2 %
Platelets: 218 10*3/uL (ref 140–400)
RBC: 4.33 10*6/uL (ref 3.80–5.10)
RDW: 12 % (ref 11.0–15.0)
Total Lymphocyte: 17.7 %
WBC: 8.6 10*3/uL (ref 3.8–10.8)

## 2022-08-02 LAB — MICROALBUMIN / CREATININE URINE RATIO
Creatinine, Urine: 8 mg/dL — ABNORMAL LOW (ref 20–275)
Microalb, Ur: <0.2 mg/dL

## 2022-08-02 LAB — INSULIN, RANDOM: Insulin: 40.6 u[IU]/mL — ABNORMAL HIGH

## 2022-08-02 LAB — MAGNESIUM: Magnesium: 2.1 mg/dL (ref 1.5–2.5)

## 2022-08-02 LAB — LIPID PANEL
Cholesterol: 174 mg/dL (ref <200)
HDL: 71 mg/dL (ref >=50)
LDL Cholesterol (Calc): 82 mg/dL (calc)
Non-HDL Cholesterol (Calc): 103 mg/dL (calc) (ref <130)
Total CHOL/HDL Ratio: 2.5 (calc) (ref <5.0)

## 2022-08-02 LAB — HEMOGLOBIN A1C
Hgb A1c MFr Bld: 6 % of total Hgb — ABNORMAL HIGH (ref <5.7)
eAG (mmol/L): 7 mmol/L

## 2022-08-02 LAB — TSH: TSH: 3.85 mIU/L (ref 0.40–4.50)

## 2022-08-02 LAB — VITAMIN D 25 HYDROXY (VIT D DEFICIENCY, FRACTURES): Vit D, 25-Hydroxy: 83 ng/mL (ref 30–100)

## 2022-09-07 ENCOUNTER — Ambulatory Visit: Payer: Medicare PPO | Admitting: Nurse Practitioner

## 2022-09-27 DIAGNOSIS — M810 Age-related osteoporosis without current pathological fracture: Secondary | ICD-10-CM | POA: Diagnosis not present

## 2022-10-08 DIAGNOSIS — D2272 Melanocytic nevi of left lower limb, including hip: Secondary | ICD-10-CM | POA: Diagnosis not present

## 2022-10-08 DIAGNOSIS — L821 Other seborrheic keratosis: Secondary | ICD-10-CM | POA: Diagnosis not present

## 2022-10-08 DIAGNOSIS — Z85828 Personal history of other malignant neoplasm of skin: Secondary | ICD-10-CM | POA: Diagnosis not present

## 2022-10-08 DIAGNOSIS — D2262 Melanocytic nevi of left upper limb, including shoulder: Secondary | ICD-10-CM | POA: Diagnosis not present

## 2022-10-08 DIAGNOSIS — Z808 Family history of malignant neoplasm of other organs or systems: Secondary | ICD-10-CM | POA: Diagnosis not present

## 2022-10-08 DIAGNOSIS — D2371 Other benign neoplasm of skin of right lower limb, including hip: Secondary | ICD-10-CM | POA: Diagnosis not present

## 2022-10-08 DIAGNOSIS — D2372 Other benign neoplasm of skin of left lower limb, including hip: Secondary | ICD-10-CM | POA: Diagnosis not present

## 2022-10-08 DIAGNOSIS — L570 Actinic keratosis: Secondary | ICD-10-CM | POA: Diagnosis not present

## 2022-10-08 DIAGNOSIS — L578 Other skin changes due to chronic exposure to nonionizing radiation: Secondary | ICD-10-CM | POA: Diagnosis not present

## 2022-10-18 DIAGNOSIS — H401213 Low-tension glaucoma, right eye, severe stage: Secondary | ICD-10-CM | POA: Diagnosis not present

## 2022-10-18 DIAGNOSIS — H401221 Low-tension glaucoma, left eye, mild stage: Secondary | ICD-10-CM | POA: Diagnosis not present

## 2022-10-18 DIAGNOSIS — H25813 Combined forms of age-related cataract, bilateral: Secondary | ICD-10-CM | POA: Diagnosis not present

## 2022-11-05 ENCOUNTER — Ambulatory Visit: Payer: Medicare PPO | Admitting: Internal Medicine

## 2022-11-24 ENCOUNTER — Ambulatory Visit: Payer: Medicare PPO | Admitting: Internal Medicine

## 2022-11-29 DIAGNOSIS — H2513 Age-related nuclear cataract, bilateral: Secondary | ICD-10-CM | POA: Diagnosis not present

## 2022-11-29 DIAGNOSIS — H25013 Cortical age-related cataract, bilateral: Secondary | ICD-10-CM | POA: Diagnosis not present

## 2022-11-29 DIAGNOSIS — H524 Presbyopia: Secondary | ICD-10-CM | POA: Diagnosis not present

## 2022-12-02 DIAGNOSIS — H401213 Low-tension glaucoma, right eye, severe stage: Secondary | ICD-10-CM | POA: Diagnosis not present

## 2022-12-02 DIAGNOSIS — H401221 Low-tension glaucoma, left eye, mild stage: Secondary | ICD-10-CM | POA: Diagnosis not present

## 2022-12-02 NOTE — Progress Notes (Signed)
Future Appointments  Date Time Provider Department  12/03/2022               3 mo  ov  10:30 AM Lucky Cowboy, MD [facility]  03/23/2023                6 mo ov & wellness  2:30 PM Adela Glimpse, NP [facility]  08/16/2023                cpe 11:00 AM Lucky Cowboy, MD [facility]         This very nice 77 y.o. MWF with  labile HTN, HLD, Prediabetes  and Vitamin D Deficiency presents for a   f/u OV.                                           Patient has hx/o Osteoporosis and had been off of Fosamax from 2019 until dexaBMD in Oct 2020 showed worsening with T -3.3 and Fosamax was restarted.        HTN predates  since 2012. Patient's BP has been controlled at home and patient denies any cardiac symptoms as chest pain, palpitations, shortness of breath, dizziness or ankle swelling. Today's BP is at goal  -                          .        Patient's hyperlipidemia is   controlled with diet. Last lipids were  at goal:  Lab Results  Component Value Date   CHOL 174 07/30/2022   HDL 71 07/30/2022   LDLCALC 82 07/30/2022   TRIG 110 07/30/2022   CHOLHDL 2.5 07/30/2022        Patient has hx/o  prediabetes (A1c 5.7% /2012 & 6.0% /2016).  Patient denies reactive hypoglycemic symptoms, visual blurring, diabetic polys or paresthesias. Last A1c was near goal:  Lab Results  Component Value Date   HGBA1C 6.0 (H) 07/30/2022                                          In 2015, patient was dx'd Hypothyroid and started on Thyroid Replacement.       Finally, patient has history of Vitamin D Deficiency and last Vitamin D was at goal:  Lab Results  Component Value Date   VD25OH 83 07/30/2022       Current Outpatient Medications  Medication Instructions   aspirin EC  81 mg Daily   CALCIUM  + D  Daily   VITAMIN D   2,000 Units Daily   CINNAMON   1,000 mg 2 times daily   PROLIA  60 mg Subcut Every 6 months   TRUSOPT 2 % ophth soln 3 times daily   fish oil-omega-3     1 g, Daily    levothyroxine  50 MCG tablet TAKE 1/2 TABLET DAILY    Multiple Vitamins-Minerals  1 tablet, Daily   Netarsudil-Latanoprost (ROCKLATAN) 0.02-0.005 % SOLN 1 drop, Left Eye, Daily   rosuvastatin  10 MG tablet TAKE 1 TABLET  EVERY DAY   rosuvastatin  5 MG tablet Take 1 tablet Daily for Cholesterol   VITAMIN K  Oral     Allergies  Allergen Reactions   Brimonidine Itching  Silver Sulfadiazine     Past Medical History:  Diagnosis Date   Arthritis    Cancer (HCC) 2016   squamous cell skin cancer    COPD (chronic obstructive pulmonary disease) (HCC)    Elevated hemoglobin A1c    Hyperlipidemia    Labile hypertension    Osteoporosis    Villous adenoma 2008    Health Maintenance  Topic Date Due   INFLUENZA VACCINE  09/22/2020   COLONOSCOP 02/07/2021   MAMMOGRAM  02/10/2022   TETANUS/TDAP  07/29/2026   DEXA SCAN  Completed   COVID-19 Vaccine  Completed   Hepatitis C Screening  Completed   PNA vac Low Risk Adult  Completed   HPV VACCINES  Aged Out    Immunization History  Administered Date(s) Administered   Influenza, High Dose Seasonal PF 12/20/2018, 01/02/2020   Influenza 12/23/2016, 01/23/2017   PFIZER  SARS-COV-2 Vacc 04/02/2019, 04/27/2019, 12/20/2019   Pneumococcal - 13 01/07/2014   Pneumococcal - 23 08/07/2015   Pneumococcal -23 02/23/1995   Td 02/22/2005, 07/28/2016   Zoster 12/16/2011    Last Colon - 02/08/2020 - Dr Georges Mouse [facility] - polyps with high grade dysplasia recommending 1 year f/u Colon  - done Feb 11, 2021 and Recc 1 yr f/u  due Dec 2023  Last MGM - 03/25/2021  Last dexaBMD  - 03/25/2021   Past Surgical History:  Procedure Laterality Date   HIP PINNING,CANNULATED Left 09/19/2012   Procedure: Percutaneous Screw Fixation Left Hip Fracture;  Surgeon: Mable Paris, MD;  Location: [facility] OR;  Service: Orthopedics;  Laterality: Left;   MANDIBLE SURGERY     TONSILLECTOMY      Family History  Problem Relation Age of Onset   Hypertension  Mother    Diabetes Mother    Mitral valve prolapse Mother    Heart disease Mother    Cancer - Other Father        Leukemia and Melanoma   COPD Father    Stroke Father    CAD Paternal Grandfather    CAD Maternal Grandfather    Heart disease Brother    Cancer Maternal Aunt        colon   Cancer Paternal Aunt        colon    Social History   Tobacco Use   Smoking status: Former Smoker    Quit date: 02/22/1978    Years since quitting: 42.3   Smokeless tobacco: Never Used  Substance Use Topics   Alcohol use: Yes    Alcohol/week: 14.0 standard drinks    Types: 14 Standard drinks or equivalent per week   Drug use: No    ROS Constitutional: Denies fever, chills, weight loss/gain, headaches, insomnia,  night sweats, and change in appetite. Does c/o fatigue. Eyes: Denies redness, blurred vision, diplopia, discharge, itchy, watery eyes.  ENT: Denies discharge, congestion, post nasal drip, epistaxis, sore throat, earache, hearing loss, dental pain, Tinnitus, Vertigo, Sinus pain, snoring.  Cardio: Denies chest pain, palpitations, irregular heartbeat, syncope, dyspnea, diaphoresis, orthopnea, PND, claudication, edema Respiratory: denies cough, dyspnea, DOE, pleurisy, hoarseness, laryngitis, wheezing.  Gastrointestinal: Denies dysphagia, heartburn, reflux, water brash, pain, cramps, nausea, vomiting, bloating, diarrhea, constipation, hematemesis, melena, hematochezia, jaundice, hemorrhoids Genitourinary: Denies dysuria, frequency, urgency, nocturia, hesitancy, discharge, hematuria, flank pain Breast: Breast lumps, nipple discharge, bleeding.  Musculoskeletal: Denies arthralgia, myalgia, stiffness, Jt. Swelling, pain, limp, and strain/sprain. Denies falls. Skin: Denies puritis, rash, hives, warts, acne, eczema, changing in skin lesion Neuro: No weakness, tremor, incoordination, spasms,  paresthesia, pain Psychiatric: Denies confusion, memory loss, sensory loss. Denies Depression. Endocrine:  Denies change in weight, skin, hair change, nocturia, and paresthesia, diabetic polys, visual blurring, hyper / hypo glycemic episodes.  Heme/Lymph: No excessive bleeding, bruising, enlarged lymph nodes.  Physical Exam  There were no vitals taken for this visit.  General Appearance: Well nourished, well groomed and in no apparent distress.  Eyes: PERRLA, EOMs, conjunctiva no swelling or erythema, normal fundi and vessels. Sinuses: No frontal/maxillary tenderness ENT/Mouth: EACs patent / TMs  nl. Nares clear without erythema, swelling, mucoid exudates. Oral hygiene is good. No erythema, swelling, or exudate. Tongue normal, non-obstructing. Tonsils not swollen or erythematous. Hearing normal.  Neck: Supple, thyroid not palpable. No bruits, nodes or JVD. Respiratory: Respiratory effort normal.  BS equal and clear bilateral without rales, rhonci, wheezing or stridor. Cardio: Heart sounds are normal with regular rate and rhythm and no murmurs, rubs or gallops. Peripheral pulses are normal and equal bilaterally without edema. No aortic or femoral bruits. Chest: symmetric with normal excursions and percussion. Breasts: Symmetric, without lumps, nipple discharge, retractions, or fibrocystic changes.  Abdomen: Flat, soft with bowel sounds active. Nontender, no guarding, rebound, hernias, masses, or organomegaly.  Lymphatics: Non tender without lymphadenopathy.  Genitourinary:  Musculoskeletal: Full ROM all peripheral extremities, joint stability, 5/5 strength, and normal gait. Skin: Warm and dry without rashes, lesions, cyanosis, clubbing or  ecchymosis.  Neuro: Cranial nerves intact, reflexes equal bilaterally. Normal muscle tone, no cerebellar symptoms. Sensation intact.  Pysch: Alert and oriented X 3, normal affect, Insight and Judgment appropriate.   Assessment and Plan  1. Labile hypertension  - CBC with Differential/Platelet - COMPLETE METABOLIC PANEL WITH GFR - Magnesium - TSH  2.  Hyperlipidemia, mixed  - Lipid panel - TSH  3. Abnormal glucose  - Hemoglobin A1c - Insulin, random  4. Vitamin D deficiency  - VITAMIN D 25 Hydroxy   5. Hypothyroidism  - TSH  6. Medication management  - CBC with Differential/Platelet - COMPLETE METABOLIC PANEL WITH GFR - Magnesium - Lipid panel - TSH - Hemoglobin A1c - Insulin, random - VITAMIN D 25 Hydroxy   7. Need for immunization against influenza  - Flu vaccine HIGH DOSE PF (Fluzone High dose)             Patient was counseled in prudent diet to achieve/maintain BMI less than 25 for weight control, BP monitoring, regular exercise and medications. Discussed med's effects and SE's. Screening labs and tests as requested with regular follow-up as recommended. Over 40 minutes of exam, counseling, chart review and high complex critical decision making was performed.   Marinus Maw, MD

## 2022-12-03 ENCOUNTER — Ambulatory Visit: Payer: Medicare PPO | Admitting: Internal Medicine

## 2022-12-03 ENCOUNTER — Encounter: Payer: Self-pay | Admitting: Internal Medicine

## 2022-12-03 VITALS — BP 138/68 | HR 67 | Temp 98.0°F | Resp 16 | Ht 67.5 in | Wt 119.4 lb

## 2022-12-03 DIAGNOSIS — R7309 Other abnormal glucose: Secondary | ICD-10-CM

## 2022-12-03 DIAGNOSIS — Z79899 Other long term (current) drug therapy: Secondary | ICD-10-CM | POA: Diagnosis not present

## 2022-12-03 DIAGNOSIS — R0989 Other specified symptoms and signs involving the circulatory and respiratory systems: Secondary | ICD-10-CM | POA: Diagnosis not present

## 2022-12-03 DIAGNOSIS — E559 Vitamin D deficiency, unspecified: Secondary | ICD-10-CM

## 2022-12-03 DIAGNOSIS — E039 Hypothyroidism, unspecified: Secondary | ICD-10-CM | POA: Diagnosis not present

## 2022-12-03 DIAGNOSIS — E782 Mixed hyperlipidemia: Secondary | ICD-10-CM | POA: Diagnosis not present

## 2022-12-03 NOTE — Patient Instructions (Signed)

## 2022-12-04 ENCOUNTER — Encounter: Payer: Self-pay | Admitting: Internal Medicine

## 2022-12-04 NOTE — Progress Notes (Signed)
<>*<>*<>*<>*<>*<>*<>*<>*<>*<>*<>*<>*<>*<>*<>*<>*<>*<>*<>*<>*<>*<>*<>*<>*<> <>*<>*<>*<>*<>*<>*<>*<>*<>*<>*<>*<>*<>*<>*<>*<>*<>*<>*<>*<>*<>*<>*<>*<>*<>  -  Test results slightly outside the reference range are not unusual.  If there is anything important, I will review this with you,  otherwise it is considered normal test values.  If you have further questions,  please do not hesitate to contact me at the office or via My Chart.   <>*<>*<>*<>*<>*<>*<>*<>*<>*<>*<>*<>*<>*<>*<>*<>*<>*<>*<>*<>*<>*<>*<>*<>*<> <>*<>*<>*<>*<>*<>*<>*<>*<>*<>*<>*<>*<>*<>*<>*<>*<>*<>*<>*<>*<>*<>*<>*<>*<>  -  Random Glucose is very elevated at 173 mg%  ( Normal is less than 100 mg% )   - And A1c also is still elevated at 5.9%  ( Normal is less than 5.6% )   - So   - Avoid Sweets, Candy & White Stuff   - White Rice, White Potatoes, White Flour  - Breads &  Pasta   <>*<>*<>*<>*<>*<>*<>*<>*<>*<>*<>*<>*<>*<>*<>*<>*<>*<>*<>*<>*<>*<>*<>*<>*<> <>*<>*<>*<>*<>*<>*<>*<>*<>*<>*<>*<>*<>*<>*<>*<>*<>*<>*<>*<>*<>*<>*<>*<>*<>  -  Chol = 190  &  LDL =  91       both Great   <>*<>*<>*<>*<>*<>*<>*<>*<>*<>*<>*<>*<>*<>*<>*<>*<>*<>*<>*<>*<>*<>*<>*<>*<> <>*<>*<>*<>*<>*<>*<>*<>*<>*<>*<>*<>*<>*<>*<>*<>*<>*<>*<>*<>*<>*<>*<>*<>*<>  -  Vitamin D = 84 - Excellent  - Please keep dose same   <>*<>*<>*<>*<>*<>*<>*<>*<>*<>*<>*<>*<>*<>*<>*<>*<>*<>*<>*<>*<>*<>*<>*<>*<> <>*<>*<>*<>*<>*<>*<>*<>*<>*<>*<>*<>*<>*<>*<>*<>*<>*<>*<>*<>*<>*<>*<>*<>*<>  -  All Else - CBC - Kidneys - Electrolytes - Liver - Magnesium & Thyroid    - all  Normal / OK  <>*<>*<>*<>*<>*<>*<>*<>*<>*<>*<>*<>*<>*<>*<>*<>*<>*<>*<>*<>*<>*<>*<>*<>*<> <>*<>*<>*<>*<>*<>*<>*<>*<>*<>*<>*<>*<>*<>*<>*<>*<>*<>*<>*<>*<>*<>*<>*<>*<>

## 2022-12-06 LAB — CBC WITH DIFFERENTIAL/PLATELET
Absolute Monocytes: 695 {cells}/uL (ref 200–950)
Basophils Absolute: 63 {cells}/uL (ref 0–200)
Basophils Relative: 0.8 %
Eosinophils Absolute: 427 {cells}/uL (ref 15–500)
Eosinophils Relative: 5.4 %
HCT: 43.7 % (ref 35.0–45.0)
Hemoglobin: 14.1 g/dL (ref 11.7–15.5)
Lymphs Abs: 1793 {cells}/uL (ref 850–3900)
MCH: 30 pg (ref 27.0–33.0)
MCHC: 32.3 g/dL (ref 32.0–36.0)
MCV: 93 fL (ref 80.0–100.0)
MPV: 11.6 fL (ref 7.5–12.5)
Monocytes Relative: 8.8 %
Neutro Abs: 4922 {cells}/uL (ref 1500–7800)
Neutrophils Relative %: 62.3 %
Platelets: 264 10*3/uL (ref 140–400)
RBC: 4.7 10*6/uL (ref 3.80–5.10)
RDW: 12.5 % (ref 11.0–15.0)
Total Lymphocyte: 22.7 %
WBC: 7.9 10*3/uL (ref 3.8–10.8)

## 2022-12-06 LAB — VITAMIN D 25 HYDROXY (VIT D DEFICIENCY, FRACTURES): Vit D, 25-Hydroxy: 84 ng/mL (ref 30–100)

## 2022-12-06 LAB — COMPLETE METABOLIC PANEL WITH GFR
AG Ratio: 1.3 (calc) (ref 1.0–2.5)
ALT: 20 U/L (ref 6–29)
AST: 21 U/L (ref 10–35)
Albumin: 4.3 g/dL (ref 3.6–5.1)
Alkaline phosphatase (APISO): 67 U/L (ref 37–153)
BUN/Creatinine Ratio: 20 (calc) (ref 6–22)
BUN: 11 mg/dL (ref 7–25)
CO2: 30 mmol/L (ref 20–32)
Calcium: 9.9 mg/dL (ref 8.6–10.4)
Chloride: 101 mmol/L (ref 98–110)
Creat: 0.56 mg/dL — ABNORMAL LOW (ref 0.60–1.00)
Globulin: 3.2 g/dL (ref 1.9–3.7)
Glucose, Bld: 173 mg/dL — ABNORMAL HIGH (ref 65–99)
Potassium: 4.2 mmol/L (ref 3.5–5.3)
Sodium: 140 mmol/L (ref 135–146)
Total Bilirubin: 0.6 mg/dL (ref 0.2–1.2)
Total Protein: 7.5 g/dL (ref 6.1–8.1)
eGFR: 95 mL/min/{1.73_m2} (ref >=60)

## 2022-12-06 LAB — LIPID PANEL
Cholesterol: 190 mg/dL (ref <200)
HDL: 73 mg/dL (ref >=50)
LDL Cholesterol (Calc): 91 mg/dL
Non-HDL Cholesterol (Calc): 117 mg/dL (ref <130)
Total CHOL/HDL Ratio: 2.6 (calc) (ref <5.0)
Triglycerides: 163 mg/dL — ABNORMAL HIGH (ref <150)

## 2022-12-06 LAB — TSH: TSH: 3.45 m[IU]/L (ref 0.40–4.50)

## 2022-12-06 LAB — HEMOGLOBIN A1C
Hgb A1c MFr Bld: 5.9 %{Hb} — ABNORMAL HIGH (ref <5.7)
Mean Plasma Glucose: 123 mg/dL
eAG (mmol/L): 6.8 mmol/L

## 2022-12-06 LAB — INSULIN, RANDOM: Insulin: 37.1 u[IU]/mL — ABNORMAL HIGH

## 2022-12-06 LAB — MAGNESIUM: Magnesium: 2.4 mg/dL (ref 1.5–2.5)

## 2023-01-25 ENCOUNTER — Ambulatory Visit (INDEPENDENT_AMBULATORY_CARE_PROVIDER_SITE_OTHER): Payer: Medicare PPO | Admitting: Internal Medicine

## 2023-01-25 VITALS — Temp 97.5°F

## 2023-01-25 DIAGNOSIS — Z23 Encounter for immunization: Secondary | ICD-10-CM | POA: Diagnosis not present

## 2023-02-09 DIAGNOSIS — L578 Other skin changes due to chronic exposure to nonionizing radiation: Secondary | ICD-10-CM | POA: Diagnosis not present

## 2023-02-09 DIAGNOSIS — L570 Actinic keratosis: Secondary | ICD-10-CM | POA: Diagnosis not present

## 2023-02-09 DIAGNOSIS — I781 Nevus, non-neoplastic: Secondary | ICD-10-CM | POA: Diagnosis not present

## 2023-02-09 DIAGNOSIS — L821 Other seborrheic keratosis: Secondary | ICD-10-CM | POA: Diagnosis not present

## 2023-03-02 ENCOUNTER — Other Ambulatory Visit: Payer: Self-pay

## 2023-03-02 MED ORDER — ROSUVASTATIN CALCIUM 10 MG PO TABS: 10.0000 mg | ORAL_TABLET | Freq: Every day | ORAL | 3 refills | Status: DC

## 2023-03-23 ENCOUNTER — Ambulatory Visit: Payer: Medicare PPO | Admitting: Nurse Practitioner

## 2023-04-12 ENCOUNTER — Ambulatory Visit: Payer: Medicare PPO | Admitting: Nurse Practitioner

## 2023-04-12 ENCOUNTER — Other Ambulatory Visit: Payer: Self-pay | Admitting: Endocrinology

## 2023-04-12 ENCOUNTER — Other Ambulatory Visit: Payer: Self-pay | Admitting: Internal Medicine

## 2023-04-12 DIAGNOSIS — M810 Age-related osteoporosis without current pathological fracture: Secondary | ICD-10-CM

## 2023-04-12 DIAGNOSIS — Z1231 Encounter for screening mammogram for malignant neoplasm of breast: Secondary | ICD-10-CM

## 2023-05-12 ENCOUNTER — Ambulatory Visit
Admission: RE | Admit: 2023-05-12 | Discharge: 2023-05-12 | Disposition: A | Discharge status: Home / Self Care | Payer: Medicare PPO | Source: Ambulatory Visit | Attending: Internal Medicine

## 2023-05-12 DIAGNOSIS — Z1231 Encounter for screening mammogram for malignant neoplasm of breast: Secondary | ICD-10-CM

## 2023-05-24 ENCOUNTER — Encounter: Payer: Self-pay | Admitting: Internal Medicine

## 2023-05-24 ENCOUNTER — Ambulatory Visit: Payer: Medicare PPO | Admitting: Internal Medicine

## 2023-05-24 VITALS — BP 126/76 | HR 61 | Temp 97.7°F | Ht 67.5 in | Wt 117.8 lb

## 2023-05-24 DIAGNOSIS — S46819A Strain of other muscles, fascia and tendons at shoulder and upper arm level, unspecified arm, initial encounter: Secondary | ICD-10-CM

## 2023-05-24 DIAGNOSIS — M810 Age-related osteoporosis without current pathological fracture: Secondary | ICD-10-CM

## 2023-05-24 DIAGNOSIS — L03119 Cellulitis of unspecified part of limb: Secondary | ICD-10-CM

## 2023-05-24 DIAGNOSIS — R0989 Other specified symptoms and signs involving the circulatory and respiratory systems: Secondary | ICD-10-CM

## 2023-05-24 DIAGNOSIS — I2510 Atherosclerotic heart disease of native coronary artery without angina pectoris: Secondary | ICD-10-CM

## 2023-05-24 DIAGNOSIS — I700 Atherosclerosis of aorta: Secondary | ICD-10-CM

## 2023-05-24 DIAGNOSIS — R7303 Prediabetes: Secondary | ICD-10-CM

## 2023-05-24 DIAGNOSIS — J449 Chronic obstructive pulmonary disease, unspecified: Secondary | ICD-10-CM

## 2023-05-24 DIAGNOSIS — M720 Palmar fascial fibromatosis [Dupuytren]: Secondary | ICD-10-CM

## 2023-05-24 DIAGNOSIS — E039 Hypothyroidism, unspecified: Secondary | ICD-10-CM

## 2023-05-24 DIAGNOSIS — Z87311 Personal history of (healed) other pathological fracture: Secondary | ICD-10-CM

## 2023-05-24 MED ORDER — DOXYCYCLINE HYCLATE 100 MG PO TABS
100.0000 mg | ORAL_TABLET | Freq: Two times a day (BID) | ORAL | 0 refills | Status: AC
Start: 2023-05-24 — End: ?

## 2023-05-24 NOTE — Assessment & Plan Note (Signed)
 Hypothyroidism is managed with levothyroxine. Continue the current dose of levothyroxine. This managed by Primary Care Provider (PCP)

## 2023-05-24 NOTE — Assessment & Plan Note (Signed)
 Mild, asymptomatic COPD with consistently high oxygen levels indicates a well-managed condition.

## 2023-05-24 NOTE — Assessment & Plan Note (Signed)
 She reports pain in the posterior deltoid region for about two weeks, worsened by abduction. No specific injury is identified, likely a strain. Non-surgical management with exercises and adjustments in sleeping position is discussed. Provide a handout with exercises for deltoid strain. Advise gentle stretching and strengthening exercises after pain subsides. Recommend adjusting sleeping position and using a memory foam topper for shoulder support.

## 2023-05-24 NOTE — Assessment & Plan Note (Signed)
 She has hardening of the aorta, possibly exacerbated by previous smoking and high sugar intake. Managing cholesterol and blood pressure is crucial to reduce cardiovascular risk. Continue rosuvastatin for cholesterol management and aspirin therapy.

## 2023-05-24 NOTE — Patient Instructions (Addendum)
 VISIT SUMMARY:  Today, we discussed several health concerns, including an itchy spot on your knee, nodules on your hand, arm pain, osteoporosis, prediabetes, and general health maintenance. We reviewed your medications and made some recommendations for managing your conditions.  YOUR PLAN:  -TICK BITE WITH POSSIBLE CELLULITIS: You have an itchy spot on your knee, which might be a tick bite. Although there is no visible tick or rash, we are treating it as a precaution against Lyme disease and cellulitis. You will take doxycycline for 10 days. Please drink a full glass of water with each dose to prevent throat irritation and monitor the area for any changes.  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 To manage this, wear gloves when gripping objects and use moisturizers to protect your hands. We will refer you to a hand specialist for further evaluation.  -EARLY DUPUYTREN'S DISEASE: The nodules on your hand are also consistent with early Dupuytren's disease, a condition where tissue under the skin of the palm thickens. Since there is no contracture of your fingers, no immediate treatment is needed. We will monitor the condition and refer you to a hand specialist if it worsens.  -DELTOID STRAIN: Your arm pain is likely due to a strain in the deltoid muscle. This can be managed with gentle stretching and strengthening exercises, which we have provided in a handout. Adjust your sleeping position and consider using a memory foam topper for better shoulder support.  -OSTEOPOROSIS: You have severe osteoporosis, a condition where bones become weak and brittle. Continue with your Prolia injections, maintain a high-protein diet, and engage in weight-bearing exercises. Avoid high-impact activities to prevent fractures.  -AORTIC SCLEROSIS: Aortic sclerosis is the hardening of the aorta, often due to aging and  previous lifestyle factors. Continue taking rosuvastatin for cholesterol and aspirin to manage cardiovascular risk.  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 No changes are needed at this time.  -PREDIABETES: Prediabetes means your blood sugar levels are higher than normal but not high enough to be diabetes. Your recent A1c is 6.0. Monitor your carbohydrate intake and reduce sweets to manage your blood sugar levels.  -HYPOTHYROIDISM: Hypothyroidism is when your thyroid gland doesn't produce enough hormones. Continue taking your current dose of levothyroxine.  -GENERAL HEALTH MAINTENANCE: You are due for a shingles vaccine. You had a recent colonoscopy in December 2023. Regular screenings and vaccinations are important for your overall health. We will provide a prescription for the shingles vaccine, which is a two-dose series.  INSTRUCTIONS:  Please follow up with the hand specialist for further evaluation of your hand nodules. Complete the full course of doxycycline for the possible tick bite. Continue monitoring your blood pressure and blood sugar levels at home. Schedule your shingles vaccine as soon as possible. If your symptoms worsen or you have any new concerns, please contact our office.   Assessment: The patient's symptoms suggest a likely muscle strain in the posterior deltoid region, potentially due to an awkward sleeping position or minor overuse. The absence of tenderness on massage and the intermittent nature of the pain support a conservative approach. Comprehensive Treatment Plan: Rest & Activity Modification: Rest: Advise the patient to avoid activities that significantly stress the shoulder, especially those that involve excessive abduction or overhead movements. Ergonomics: Suggest modifications in sleeping position (using supportive pillows) and daily  activities to reduce strain on  the shoulder. Pain Management: NSAIDs: Recommend a short course of nonsteroidal anti-inflammatory drugs (e.g., ibuprofen or naproxen) to help reduce inflammation and pain. Ice/Heat Therapy: Ice: Apply ice packs to the affected area for 15-20 minutes every 2-3 hours during the initial phase (first 48-72 hours) to reduce inflammation. Heat: After the initial phase, using a warm compress or taking a warm shower can help relax the muscle and ease stiffness. Physical Therapy & Exercise: Gentle Stretching: Begin with gentle stretching exercises targeting the shoulder and deltoid muscles to maintain range of motion. Examples include pendulum swings and gentle cross-body stretches. Strengthening Exercises: Once acute pain subsides (usually after a few days to a week), introduce light strengthening exercises focusing on the rotator cuff and scapular stabilizers. A physical therapist can guide the patient through these exercises. Posture Training: Incorporate exercises that promote proper posture and shoulder alignment to prevent recurrence. Self-Care Strategies: Massage: Although direct massage did not improve pain initially, gentle self-massage with a foam roller or massage ball (avoiding the most painful spots) may be helpful once the acute phase has passed. Hydration and Nutrition: Encourage maintaining adequate hydration and a balanced diet to support muscle recovery. Monitoring & Follow-Up: Symptom Tracking: Advise the patient to keep a symptom diary noting pain levels, activities that exacerbate or relieve symptoms, and any changes over time. Follow-Up Appointment: Schedule a follow-up in 2-4 weeks to evaluate progress. If symptoms persist, worsen, or new symptoms (e.g., numbness, weakness) arise, further evaluation (such as imaging or referral to a specialist) may be warranted. Patient Education: Understanding the Condition: Explain that muscle strains typically resolve with conservative management  over time, but gradual return to normal activity is key. Prevention Tips: Provide guidance on proper lifting techniques, stretching routines, and ergonomics to reduce the risk of future strains.  Summary: This treatment plan focuses on conservative management with rest, pain control, gentle stretching, and gradual strengthening. Monitoring symptoms and appropriate follow-up are essential to ensure recovery and prevent recurrence. If the pain persists beyond a few weeks or worsens, a re-evaluation may be necessary to rule out other underlying conditions.    From the description and typical presentation, these small, slowly enlarging, sometimes?tender nodules in the central palm--especially affecting the ring (4th) and middle (3rd) fingers--are most consistent with early Dupuytren's disease (also called palmar fibromatosis). In Dupuytren's, fibrous nodules develop in the palmar fascia and can eventually form cords leading to progressive contractures of the fingers. Early nodules can be tender, though pain often subsides over time. Key points supporting Dupuytren's disease: Nodules are located in the palm (rather than the joints themselves). Commonly involves the 3rd and 4th digits (ring and middle fingers), though the ring and little fingers are classic. Slow progression over months to years. Can start as a single nodule and gradually spread. Recommended approach: Confirm the diagnosis A clinical exam by a hand specialist can help distinguish Dupuytren's nodules from other causes (e.g., trigger finger nodules, ganglion cysts, rheumatoid nodules, or calcinosis). Assess severity and function If there is minimal or no contracture (i.e., fingers can still straighten fully) and no significant pain or functional limitation, observation is often all that's needed initially. Consider treatments if progressing or painful Collagenase injection (e.g., Xiaflex): Can break down Dupuytren's cords when  contractures develop. Needle aponeurotomy: A minimally invasive way to release cords. Surgery: Typically reserved for advanced disease with significant contractures. Steroid injections into tender nodules may provide temporary pain relief in some cases, although this is less common. Radiotherapy  is used in certain centers at a very early stage, but availability and protocols vary widely. Lifestyle and follow?up If nodules remain stable and do not impair hand function, simple monitoring (e.g., yearly checkups) may suffice. Encourage the patient to note any worsening (especially if they begin to lose the ability to fully extend the fingers). In summary, these tender palmar nodules most likely represent early Dupuytren's disease. If they are not causing contractures or functional problems, conservative management (watchful waiting) is typically advised. If nodules become painful or begin to limit finger extension, intervention (such as collagenase injection or needle aponeurotomy) can be considered.

## 2023-05-24 NOTE — Assessment & Plan Note (Signed)
 Prediabetes with a recent A1c of 6.0. Advise monitoring carbohydrate intake and reducing consumption of sweets to manage blood sugar levels. Recommend dietary modifications to reduce carbohydrate intake.

## 2023-05-24 NOTE — Progress Notes (Signed)
 [facility]  Phone: [phone]  - Medical Office Visit -  Visit Date: 05/24/2023 Patient: Katie Morton   DOB: January 27, 1946   78 y.o. Female  MRN: [id number] Patient Care Team: Lula Olszewski, MD as PCP - General (Internal Medicine) Carman Ching, MD (Inactive) as Consulting Physician (Gastroenterology) Mack Guise, DDS Elmon Else, MD as Consulting Physician (Dermatology) Dominica Severin, MD as Consulting Physician (Orthopedic Surgery) Today's Health Care Provider: Lula Olszewski, MD  ===========================================    Chief Complaint / Reason for Visit: Establish Care Chronic disease monitoring and evaluate ?tickbite.  Background: 78 y.o. female who has Osteoporosis; COPD (chronic obstructive pulmonary disease) (HCC); Hyperlipidemia; Prediabetes; Vitamin D deficiency; Hypothyroidism; BMI less than 19,adult; Protein-calorie malnutrition (HCC); History of colonic polyps; History of squamous cell carcinoma; Low tension glaucoma; Labile hypertension; Coronary artery calcification - LAD CCS 43.4 09/2020; Aortic atherosclerosis (HCC) - per CT 09/2020; Palmar fibromatosis; Strain of deltoid muscle, initial encounter; and History of fragility fracture on their problem list.  Discussed the use of AI scribe software for clinical note transcription with the patient, who gave verbal consent to proceed.  History of Present Illness This is  a 78 year old female who presents for a new patient visit.  She has an itchy spot on her knee, suspecting a tick bite from last week, but there is no visible tick or rash. The itchiness has persisted for about a week without significant discomfort. No symptoms of Lyme disease, such as rash or fever, are present.  She has a condition in her hand characterized by a 'knot' that forms and causes pain, present for about a year. It involves two lumps under the knuckle, which she suspects may be related to calcific  tendonitis. Her husband experiences similar symptoms. The nodules are consistent with early Dupuytren's disease, but there is no contracture of the fingers.  She has experienced arm pain for about two weeks, describing it as muscular and located on the side of the arm, possibly related to the deltoid muscle. The pain is intermittent, aggravated by certain movements, and she does not recall any specific injury. No recent injuries to her arm that could explain the muscular pain.  She has a history of osteoporosis and is currently on Prolia, having previously been on Fosamax. Her recent bone density test showed a 5% improvement in both her arm and femur. She has had a fragility fracture in the past, resulting in three screws in her hip.  She has prediabetes with a recent A1c of 6.0. She consumes sweets, which may contribute to her condition, and is aware of the need to monitor her carbohydrate intake.  She has a history of smoking, which she quit after about ten years. She has been told she has mild COPD, though she is asymptomatic and her oxygen levels are consistently high.  She takes rosuvastatin 5 mg for cholesterol and aspirin. She manages her blood pressure, which is described as labile, and monitors it at home.   Problem overviews updated today: Problem  Palmar Fibromatosis  Strain of Deltoid Muscle, Initial Encounter  History of Fragility Fracture  Coronary artery calcification - LAD CCS 43.4 09/2020   On aspirin/statin   Aortic atherosclerosis (HCC) - per CT 09/2020   Takes aspirin and statin   Labile Hypertension   BP Readings from Last 30 Encounters:  05/24/23 126/76  12/03/22 138/68  07/30/22 132/76  03/15/22 128/66  12/10/21 138/68  09/07/21 (!) 112/50  08/05/21 132/76  06/02/21  140/70  01/28/21 (!) 104/50  10/21/20 138/70  09/03/20 (!) 162/78  06/02/20 128/64  02/13/20 120/64  10/23/19 120/72  07/25/19 126/74  04/16/19 (!) 158/76  01/04/19 136/68  09/27/18 124/62   07/11/18 134/66  03/29/18 116/72  09/07/17 124/68  03/02/17 122/68  09/10/16 120/64  07/28/16 120/70  01/28/16 116/72  12/01/15 (!) 144/80  08/07/15 126/62  01/23/15 128/72  06/11/14 136/70  01/07/14 138/78      Hypothyroidism      Latest Ref Rng & Units 12/03/2022   10:40 AM 07/30/2022   11:14 AM 03/15/2022    3:22 PM 12/10/2021    2:37 PM 09/07/2021    3:23 PM 06/02/2021    4:09 PM 01/28/2021    4:44 PM  THYROID  TSH 0.40 - 4.50 mIU/L 3.45  3.85  4.38  4.38  3.07  3.38  3.61       Prediabetes       Osteoporosis   Restarted fosamax Dec 2015 - stopped in early 2019 DEXA 11/2018 T -3.3 - fosamax restarted  DEXA 03/25/2021 T -3.5 despite fosamax, referral to endocrinology placed and they started Prolia with some mild improvement.   Copd (Chronic Obstructive Pulmonary Disease) (Hcc)   Per CT 2012 Used to smoke     Medications updated/reviewed: Current Outpatient Medications on File Prior to Visit  Medication Sig   aspirin EC 81 MG tablet Take 81 mg by mouth daily.   Calcium Citrate-Vitamin D (CALCIUM CITRATE + D PO) Take by mouth daily.   Cholecalciferol (VITAMIN D PO) Take 2,000 Units by mouth daily.   CINNAMON PO Take 1,000 mg by mouth 2 (two) times daily.   denosumab (PROLIA) 60 MG/ML SOSY injection Inject 60 mg into the skin every 6 (six) months.   dorzolamide (TRUSOPT) 2 % ophthalmic solution 3 (three) times daily.   fish oil-omega-3 fatty acids 1000 MG capsule Take 1 g by mouth daily.    levothyroxine (SYNTHROID) 50 MCG tablet TAKE 1/2 TABLET DAILY ON AN EMPTY STOMACH FOR 30 MIN   Multiple Vitamins-Minerals (MULTIVITAMIN WITH MINERALS) tablet Take 1 tablet by mouth daily.   Netarsudil-Latanoprost (ROCKLATAN) 0.02-0.005 % SOLN Place 1 drop into the left eye daily.   rosuvastatin (CRESTOR) 10 MG tablet Take 1 tablet (10 mg total) by mouth daily.   VITAMIN K PO Take by mouth.   No current facility-administered medications on file prior to visit.  There are no  discontinued medications. Current Meds  Medication Sig   aspirin EC 81 MG tablet Take 81 mg by mouth daily.   Calcium Citrate-Vitamin D (CALCIUM CITRATE + D PO) Take by mouth daily.   Cholecalciferol (VITAMIN D PO) Take 2,000 Units by mouth daily.   CINNAMON PO Take 1,000 mg by mouth 2 (two) times daily.   denosumab (PROLIA) 60 MG/ML SOSY injection Inject 60 mg into the skin every 6 (six) months.   dorzolamide (TRUSOPT) 2 % ophthalmic solution 3 (three) times daily.   doxycycline (VIBRA-TABS) 100 MG tablet Take 1 tablet (100 mg total) by mouth 2 (two) times daily.   fish oil-omega-3 fatty acids 1000 MG capsule Take 1 g by mouth daily.    levothyroxine (SYNTHROID) 50 MCG tablet TAKE 1/2 TABLET DAILY ON AN EMPTY STOMACH FOR 30 MIN   Multiple Vitamins-Minerals (MULTIVITAMIN WITH MINERALS) tablet Take 1 tablet by mouth daily.   Netarsudil-Latanoprost (ROCKLATAN) 0.02-0.005 % SOLN Place 1 drop into the left eye daily.   rosuvastatin (CRESTOR) 10 MG tablet Take 1 tablet (10  mg total) by mouth daily.   VITAMIN K PO Take by mouth.    Allergies:   Allergies as of 05/24/2023 - Review Complete 05/24/2023  Allergen Reaction Noted   Brimonidine Itching 01/15/2020   Silver sulfadiazine     Past Medical History:  has a past medical history of Arthritis, Cancer (HCC) (2016), COPD (chronic obstructive pulmonary disease) (HCC), Elevated hemoglobin A1c, Femur fracture (HCC) (09/20/2012), Hyperlipidemia, Labile hypertension, Osteoporosis, and Villous adenoma (2008). Past Surgical History:   has a past surgical history that includes Tonsillectomy; Mandible surgery; and Hip pinning, cannulated (Left, 09/19/2012). Social History:   reports that she quit smoking about 45 years ago. Her smoking use included cigarettes. She has never used smokeless tobacco. She reports current alcohol use of about 14.0 standard drinks of alcohol per week. She reports that she does not use drugs. Family History:  family history  includes CAD in her maternal grandfather and paternal grandfather; COPD in her father; Cancer in her maternal aunt and paternal aunt; Cancer - Other in her father; Diabetes in her mother; Heart disease in her brother and mother; Hypertension in her mother; Mitral valve prolapse in her mother; Stroke in her father. Depression Screen and Health Maintenance:    05/24/2023   10:12 AM 12/04/2022   11:27 PM 07/29/2022   11:30 PM 03/15/2022    3:10 PM  PHQ 2/9 Scores  PHQ - 2 Score 0 0 0 0   Health Maintenance  Topic Date Due   Zoster Vaccines- Shingrix (1 of 2) 06/09/1966   Colonoscopy  02/18/2023   Medicare Annual Wellness (AWV)  03/16/2023   DEXA SCAN  03/26/2023   COVID-19 Vaccine (4 - 2024-25 season) 06/09/2023 (Originally 10/24/2022)   INFLUENZA VACCINE  09/23/2023   DTaP/Tdap/Td (3 - Tdap) 07/29/2026   Pneumonia Vaccine 20+ Years old  Completed   Hepatitis C Screening  Completed   HPV VACCINES  Aged Out   Immunization History  Administered Date(s) Administered   Influenza, High Dose Seasonal PF 01/02/2013, 01/07/2014, 01/23/2015, 12/01/2015, 01/19/2017, 12/29/2017, 12/20/2018, 01/02/2020, 01/28/2021, 12/10/2021, 01/25/2023   Influenza-Unspecified 12/16/2011, 12/23/2016, 01/23/2017   PFIZER(Purple Top)SARS-COV-2 Vaccination 04/02/2019, 04/27/2019, 12/20/2019   PNEUMOCOCCAL CONJUGATE-20 01/28/2021   Pneumococcal Conjugate-13 01/07/2014   Pneumococcal Polysaccharide-23 08/07/2015   Pneumococcal-Unspecified 02/23/1995   Td 02/22/2005, 07/28/2016   Zoster, Live 12/16/2011     Objective   Physical Exam Musculoskeletal:       Hands:   BP 126/76   Pulse 61   Temp 97.7 F (36.5 C)   Ht 5' 7.5" (1.715 m)   Wt 117 lb 12.8 oz (53.4 kg)   SpO2 100%   BMI 18.18 kg/m  Wt Readings from Last 10 Encounters:  05/24/23 117 lb 12.8 oz (53.4 kg)  12/03/22 119 lb 6.4 oz (54.2 kg)  07/30/22 123 lb 6.4 oz (56 kg)  03/15/22 120 lb 12.8 oz (54.8 kg)  12/10/21 118 lb (53.5 kg)  09/07/21 116 lb  12.8 oz (53 kg)  08/05/21 114 lb (51.7 kg)  06/02/21 116 lb 3.2 oz (52.7 kg)  01/28/21 117 lb 6.4 oz (53.3 kg)  10/21/20 115 lb (52.2 kg)  Vital signs reviewed.  Nursing notes reviewed. Weight trend reviewed. Abnormalities and problem-specific physical exam findings:   General Appearance:  Well developed, well nourished, well-groomed, healthy-appearing female with Body mass index is 18.18 kg/m. No acute distress appreciable.   Skin: Clear and well-hydrated. Pulmonary:  Normal work of breathing at rest, no respiratory distress apparent. SpO2: 100 %  Musculoskeletal:  She demonstrates smooth and coordinated movements throughout all major joints.All extremities are intact.  Neurological:  Awake, alert, oriented, and engaged.  No obvious focal neurological deficits or cognitive impairments.  Sensorium seems unclouded.  Psychiatric:  Appropriate mood, pleasant and cooperative demeanor, cheerful and engaged during the exam  Reviewed Results & Data Results LABS A1c: 6% Glucose: elevated  RADIOLOGY Bone density: 5% improvement in arm and femur (05/17/2023)  DIAGNOSTIC Colonoscopy: not specified (02/17/2022)    Results for orders placed or performed in visit on 05/24/23  HM COLONOSCOPY  Result Value Ref Range   HM Colonoscopy See Report (in chart) See Report (in chart), Patient Reported    Abstract on 05/24/2023  Component Date Value   HM Colonoscopy 02/17/2022 See Report (in chart)   Office Visit on 12/03/2022  Component Date Value   WBC 12/03/2022 7.9    RBC 12/03/2022 4.70    Hemoglobin 12/03/2022 14.1    HCT 12/03/2022 43.7    MCV 12/03/2022 93.0    MCH 12/03/2022 30.0    MCHC 12/03/2022 32.3    RDW 12/03/2022 12.5    Platelets 12/03/2022 264    MPV 12/03/2022 11.6    Neutro Abs 12/03/2022 4,922    Lymphs Abs 12/03/2022 1,793    Absolute Monocytes 12/03/2022 695    Eosinophils Absolute 12/03/2022 427    Basophils Absolute 12/03/2022 63    Neutrophils Relative %  12/03/2022 62.3    Total Lymphocyte 12/03/2022 22.7    Monocytes Relative 12/03/2022 8.8    Eosinophils Relative 12/03/2022 5.4    Basophils Relative 12/03/2022 0.8    Glucose, Bld 12/03/2022 173 (H)    BUN 12/03/2022 11    Creat 12/03/2022 0.56 (L)    eGFR 12/03/2022 95    BUN/Creatinine Ratio 12/03/2022 20    Sodium 12/03/2022 140    Potassium 12/03/2022 4.2    Chloride 12/03/2022 101    CO2 12/03/2022 30    Calcium 12/03/2022 9.9    Total Protein 12/03/2022 7.5    Albumin 12/03/2022 4.3    Globulin 12/03/2022 3.2    AG Ratio 12/03/2022 1.3    Total Bilirubin 12/03/2022 0.6    Alkaline phosphatase (AP* 12/03/2022 67    AST 12/03/2022 21    ALT 12/03/2022 20    Magnesium 12/03/2022 2.4    Cholesterol 12/03/2022 190    HDL 12/03/2022 73    Triglycerides 12/03/2022 163 (H)    LDL Cholesterol (Calc) 12/03/2022 91    Total CHOL/HDL Ratio 12/03/2022 2.6    Non-HDL Cholesterol (Cal* 12/03/2022 117    TSH 12/03/2022 3.45    Hgb A1c MFr Bld 12/03/2022 5.9 (H)    Mean Plasma Glucose 12/03/2022 123    eAG (mmol/L) 12/03/2022 6.8    Insulin 12/03/2022 37.1 (H)    Vit D, 25-Hydroxy 12/03/2022 84   Office Visit on 07/30/2022  Component Date Value   Color, Urine 07/30/2022 YELLOW    APPearance 07/30/2022 CLEAR    Specific Gravity, Urine 07/30/2022 1.003    pH 07/30/2022 7.0    Glucose, UA 07/30/2022 NEGATIVE    Bilirubin Urine 07/30/2022 NEGATIVE    Ketones, ur 07/30/2022 NEGATIVE    Hgb urine dipstick 07/30/2022 NEGATIVE    Protein, ur 07/30/2022 NEGATIVE    Nitrite 07/30/2022 NEGATIVE    Leukocytes,Ua 07/30/2022 NEGATIVE    Creatinine, Urine 07/30/2022 8 (L)    Microalb, Ur 07/30/2022 <0.2    Microalb Creat Ratio 07/30/2022 NOTE    WBC 07/30/2022 8.6  RBC 07/30/2022 4.33    Hemoglobin 07/30/2022 13.0    HCT 07/30/2022 39.4    MCV 07/30/2022 91.0    MCH 07/30/2022 30.0    MCHC 07/30/2022 33.0    RDW 07/30/2022 12.0    Platelets 07/30/2022 218    MPV 07/30/2022 11.3     Neutro Abs 07/30/2022 6,037    Lymphs Abs 07/30/2022 1,522    Absolute Monocytes 07/30/2022 679    Eosinophils Absolute 07/30/2022 292    Basophils Absolute 07/30/2022 69    Neutrophils Relative % 07/30/2022 70.2    Total Lymphocyte 07/30/2022 17.7    Monocytes Relative 07/30/2022 7.9    Eosinophils Relative 07/30/2022 3.4    Basophils Relative 07/30/2022 0.8    Glucose, Bld 07/30/2022 127 (H)    BUN 07/30/2022 13    Creat 07/30/2022 0.53 (L)    eGFR 07/30/2022 96    BUN/Creatinine Ratio 07/30/2022 25 (H)    Sodium 07/30/2022 134 (L)    Potassium 07/30/2022 4.3    Chloride 07/30/2022 98    CO2 07/30/2022 29    Calcium 07/30/2022 9.1    Total Protein 07/30/2022 7.1    Albumin 07/30/2022 4.3    Globulin 07/30/2022 2.8    AG Ratio 07/30/2022 1.5    Total Bilirubin 07/30/2022 0.6    Alkaline phosphatase (AP* 07/30/2022 60    AST 07/30/2022 18    ALT 07/30/2022 20    Magnesium 07/30/2022 2.1    Cholesterol 07/30/2022 174    HDL 07/30/2022 71    Triglycerides 07/30/2022 110    LDL Cholesterol (Calc) 07/30/2022 82    Total CHOL/HDL Ratio 07/30/2022 2.5    Non-HDL Cholesterol (Cal* 07/30/2022 103    TSH 07/30/2022 3.85    Hgb A1c MFr Bld 07/30/2022 6.0 (H)    Mean Plasma Glucose 07/30/2022 126    eAG (mmol/L) 07/30/2022 7.0    Insulin 07/30/2022 40.6 (H)    Vit D, 25-Hydroxy 07/30/2022 83    No image results found.   MM 3D SCREENING MAMMOGRAM BILATERAL BREAST Result Date: 05/16/2023 CLINICAL DATA:  Screening. EXAM: DIGITAL SCREENING BILATERAL MAMMOGRAM WITH TOMOSYNTHESIS AND CAD TECHNIQUE: Bilateral screening digital craniocaudal and mediolateral oblique mammograms were obtained. Bilateral screening digital breast tomosynthesis was performed. The images were evaluated with computer-aided detection. COMPARISON:  Previous exam(s). ACR Breast Density Category b: There are scattered areas of fibroglandular density. FINDINGS: There are no findings suspicious for malignancy.  IMPRESSION: No mammographic evidence of malignancy. A result letter of this screening mammogram will be mailed directly to the patient. RECOMMENDATION: Screening mammogram in one year. (Code:SM-B-01Y) BI-RADS CATEGORY  1: Negative. Electronically Signed   By: Amie Portland M.D.   On: 05/16/2023 12:29    MM 3D SCREENING MAMMOGRAM BILATERAL BREAST Result Date: 05/16/2023 CLINICAL DATA:  Screening. EXAM: DIGITAL SCREENING BILATERAL MAMMOGRAM WITH TOMOSYNTHESIS AND CAD TECHNIQUE: Bilateral screening digital craniocaudal and mediolateral oblique mammograms were obtained. Bilateral screening digital breast tomosynthesis was performed. The images were evaluated with computer-aided detection. COMPARISON:  Previous exam(s). ACR Breast Density Category b: There are scattered areas of fibroglandular density. FINDINGS: There are no findings suspicious for malignancy. IMPRESSION: No mammographic evidence of malignancy. A result letter of this screening mammogram will be mailed directly to the patient. RECOMMENDATION: Screening mammogram in one year. (Code:SM-B-01Y) BI-RADS CATEGORY  1: Negative. Electronically Signed   By: Amie Portland M.D.   On: 05/16/2023 12:29        Assessment & Plan Cellulitis of anterior lower leg She reports  a possible tick bite on the right knee with an itchy spot but no visible tick or rash. Suspected cellulitis is considered with a low risk of Lyme disease in [place]. Without a rash, insurance may not cover Lyme disease testing. Doxycycline is offered as a precautionary measure against Lyme disease and cellulitis. Prescribe doxycycline for 10 days. Advise drinking a full glass of water with doxycycline to prevent esophageal irritation. Instruct to monitor the area and complete the full course of antibiotics if symptoms worsen. Palmar fibromatosis Early Dupuytren's Disease   Nodules on the hand are consistent with early Dupuytren's disease. No contracture of fingers is present, so  no immediate intervention is required. Monitor for progression of nodules and consider referral to a hand specialist if the condition worsens.  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 Symptoms are manageable with non-surgical interventions. Advise wearing gloves when gripping objects and using moisturizers to protect hands. Refer to a hand specialist for further evaluation and management. Strain of deltoid muscle, initial encounter She reports pain in the posterior deltoid region for about two weeks, worsened by abduction. No specific injury is identified, likely a strain. Non-surgical management with exercises and adjustments in sleeping position is discussed. Provide a handout with exercises for deltoid strain. Advise gentle stretching and strengthening exercises after pain subsides. Recommend adjusting sleeping position and using a memory foam topper for shoulder support. Prediabetes Prediabetes with a recent A1c of 6.0. Advise monitoring carbohydrate intake and reducing consumption of sweets to manage blood sugar levels. Recommend dietary modifications to reduce carbohydrate intake. Aortic atherosclerosis (HCC) - per CT 09/2020 She has hardening of the aorta, possibly exacerbated by previous smoking and high sugar intake. Managing cholesterol and blood pressure is crucial to reduce cardiovascular risk. Continue rosuvastatin for cholesterol management and aspirin therapy. Chronic obstructive pulmonary disease, unspecified COPD type (HCC) Mild, asymptomatic COPD with consistently high oxygen levels indicates a well-managed condition. Age related osteoporosis, unspecified pathological fracture presence Severe osteoporosis with a history of fragility fracture in the left hip. Currently on Prolia with a 5% improvement in bone density. Emphasize the importance of maintaining bone health through  diet and exercise. Continue Prolia injections. Advise a high protein diet and weight-bearing exercises. Avoid high-impact activities. Managed by endocrine Hypothyroidism, unspecified type Hypothyroidism is managed with levothyroxine. Continue the current dose of levothyroxine. This managed by Primary Care Provider (PCP)  She is due for a shingles vaccine and has had a recent colonoscopy in December 2023. Emphasize the importance of regular screenings and vaccinations. Provide a prescription for the shingles vaccine and ensure awareness of the two-dose series for the shingles vaccine.   Meds ordered this encounter  Medications   doxycycline (VIBRA-TABS) 100 MG tablet    Sig: Take 1 tablet (100 mg total) by mouth 2 (two) times daily.    Dispense:  20 tablet    Refill:  0    Schedule soonest Annual Wellness Visit (AWV) and Comprehensive Physical Exam (CPE) preventive care annual visit.  I have personally spent 55 minutes involved in face-to-face and non-face-to-face activities for this patient on the day of the visit. Professional time spent includes the following activities: Preparing to see the patient (review of tests), Obtaining and/or reviewing separately obtained history (admission/discharge record), Performing a medically appropriate examination and/or evaluation , Ordering medications/tests/procedures, referring and communicating with other health care professionals, Documenting clinical information in the EMR, Independently interpreting results (  not separately reported), Communicating results to the patient/family/caregiver, Counseling and educating the patient/family/caregiver and Care coordination (not separately reported).        Additional notes: This document was synthesized by artificial intelligence ([employer]) using HIPAA-compliant recording of the clinical interaction;   We discussed the use of AI scribe software for clinical note transcription with the patient, who gave verbal  consent to proceed.    Additional Info: This encounter employed state-of-the-art, real-time, collaborative documentation. The patient actively reviewed and assisted in updating their electronic medical record on a shared screen, ensuring transparency and facilitating joint problem-solving for the problem list, overview, and plan. This approach promotes accurate, informed care. The treatment plan was discussed and reviewed in detail, including medication safety, potential side effects, and all patient questions. We confirmed understanding and comfort with the plan. Follow-up instructions were established, including contacting the office for any concerns, returning if symptoms worsen, persist, or new symptoms develop, and precautions for potential emergency department visits.  Initial Appointment Goals:  This initial visit focused on establishing a foundation for the patient's care. We collaboratively reviewed her medical history and medications in detail, updating the chart as shown in the encounter. Given the extensive information, we prioritized addressing her most pressing concerns, which she reported were: Establish Care  While the complexity of the patient's medical picture may necessitate further evaluation in subsequent visits, we were able to develop a preliminary care plan together. To expedite a comprehensive plan at the next visit, we encouraged the patient to gather relevant medical records from previous providers. This collaborative approach will ensure a more complete understanding of the patient's health and inform the development of a personalized care plan. We look forward to continuing the conversation and working together with the patient on achieving her health goals.   Collaborative Documentation:  Today's encounter utilized real-time, dynamic patient engagement.  Patients actively participate by directly reviewing and assisting in updating their medical records through a shared screen.  This transparency empowers patients to visually confirm chart updates made by the healthcare provider.  This collaborative approach facilitates problem management as we jointly update the problem list, problem overview, and assessment/plan. Ultimately, this process enhances chart accuracy and completeness, fostering shared decision-making, patient education, and informed consent for tests and treatments.  Collaborative Treatment Planning:  Treatment plans were discussed and reviewed in detail.  Explained medication safety and potential side effects.  Encouraged participation and answered all patient questions, confirming understanding and comfort with the plan. Encouraged patient to contact our office if they have any questions or concerns. Agreed on patient returning to office if symptoms worsen, persist, or new symptoms develop.  ----------------------------------------------------- Lula Olszewski, MD  05/24/2023 9:04 PM  [facility]:  [phone]

## 2023-05-24 NOTE — Assessment & Plan Note (Signed)
 Early Dupuytren's Disease   Nodules on the hand are consistent with early Dupuytren's disease. No contracture of fingers is present, so no immediate intervention is required. Monitor for progression of nodules and consider referral to a hand specialist if the condition worsens.  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 Symptoms are manageable with non-surgical interventions. Advise wearing gloves when gripping objects and using moisturizers to protect hands. Refer to a hand specialist for further evaluation and management.

## 2023-05-24 NOTE — Assessment & Plan Note (Signed)
 Severe osteoporosis with a history of fragility fracture in the left hip. Currently on Prolia with a 5% improvement in bone density. Emphasize the importance of maintaining bone health through diet and exercise. Continue Prolia injections. Advise a high protein diet and weight-bearing exercises. Avoid high-impact activities. Managed by endocrine

## 2023-06-14 ENCOUNTER — Ambulatory Visit (INDEPENDENT_AMBULATORY_CARE_PROVIDER_SITE_OTHER)

## 2023-06-14 VITALS — Ht 67.0 in | Wt 117.0 lb

## 2023-06-14 DIAGNOSIS — Z0000 Encounter for general adult medical examination without abnormal findings: Secondary | ICD-10-CM

## 2023-06-14 NOTE — Progress Notes (Addendum)
 Subjective:   Katie Morton is a 78 y.o. who presents for a Medicare Wellness preventive visit.  Visit Complete: Virtual I connected with  Katie Morton on 06/14/23 by a audio enabled telemedicine application and verified that I am speaking with the correct person using two identifiers.  Patient Location: Home  Provider Location: Office/Clinic  I discussed the limitations of evaluation and management by telemedicine. The patient expressed understanding and agreed to proceed.  Vital Signs: Because this visit was a virtual/telehealth visit, some criteria may be missing or patient reported. Any vitals not documented were not able to be obtained and vitals that have been documented are patient reported.  VideoDeclined- This patient declined Librarian, academic. Therefore the visit was completed with audio only.  Persons Participating in Visit: Patient.  AWV Questionnaire: No: Patient Medicare AWV questionnaire was not completed prior to this visit.  Cardiac Risk Factors include: advanced age (>87men, >53 women);dyslipidemia;hypertension     Objective:    Today's Vitals   06/14/23 1445  Weight: 117 lb (53.1 kg)  Height: 5\' 7"  (1.702 m)   Body mass index is 18.32 kg/m.     06/14/2023    2:50 PM 03/15/2022    3:07 PM 09/07/2021    2:57 PM 09/03/2020    2:35 PM 07/25/2019    3:31 PM 09/27/2018   10:13 AM 09/07/2017   11:15 AM  Advanced Directives  Does Patient Have a Medical Advance Directive? No No No Yes Yes No No  Type of Aeronautical engineer of [place];Living will Healthcare Power of [place];Living will    Does patient want to make changes to medical advance directive?    No - Patient declined No - Patient declined    Copy of Healthcare Power of Attorney in Chart?    No - copy requested No - copy requested    Would patient like information on creating a medical advance directive? No - Patient declined  No - Patient declined    Yes (MAU/Ambulatory/Procedural Areas - Information given) No - Patient declined    Current Medications (verified) Outpatient Encounter Medications as of 06/14/2023  Medication Sig   aspirin  EC 81 MG tablet Take 81 mg by mouth daily.   Calcium  Citrate-Vitamin D  (CALCIUM  CITRATE + D PO) Take by mouth daily.   CINNAMON PO Take 1,000 mg by mouth 2 (two) times daily.   COMIRNATY syringe    denosumab  (PROLIA ) 60 MG/ML SOSY injection Inject 60 mg into the skin every 6 (six) months.   dorzolamide (TRUSOPT) 2 % ophthalmic solution 3 (three) times daily.   fish oil-omega-3 fatty acids 1000 MG capsule Take 1 g by mouth daily.    levothyroxine  (SYNTHROID ) 50 MCG tablet TAKE 1/2 TABLET DAILY ON AN EMPTY STOMACH FOR 30 MIN   Multiple Vitamins-Minerals (MULTIVITAMIN WITH MINERALS) tablet Take 1 tablet by mouth daily.   Netarsudil-Latanoprost (ROCKLATAN) 0.02-0.005 % SOLN Place 1 drop into the left eye daily.   rosuvastatin  (CRESTOR ) 10 MG tablet Take 1 tablet (10 mg total) by mouth daily.   VITAMIN K PO Take by mouth.   YF-VAX injection    Cholecalciferol (VITAMIN D  PO) Take 2,000 Units by mouth daily. (Patient not taking: Reported on 06/14/2023)   doxycycline  (VIBRA -TABS) 100 MG tablet Take 1 tablet (100 mg total) by mouth 2 (two) times daily. (Patient not taking: Reported on 06/14/2023)   No facility-administered encounter medications on file as of 06/14/2023.    Allergies (verified)  Brimonidine and Silver sulfadiazine   History: Past Medical History:  Diagnosis Date   Arthritis    Cancer (HCC) 2016   squamous cell skin cancer    COPD (chronic obstructive pulmonary disease) (HCC)    Elevated hemoglobin A1c    Femur fracture (HCC) 09/20/2012   Hyperlipidemia    Labile hypertension    Osteoporosis    Villous adenoma 2008   Past Surgical History:  Procedure Laterality Date   HIP PINNING,CANNULATED Left 09/19/2012   Procedure: Percutaneous Screw Fixation Left Hip Fracture;  Surgeon: Derald Flattery, MD;  Location: [facility] OR;  Service: Orthopedics;  Laterality: Left;   MANDIBLE SURGERY     TONSILLECTOMY     Family History  Problem Relation Age of Onset   Hypertension Mother    Diabetes Mother    Mitral valve prolapse Mother    Heart disease Mother    Cancer - Other Father        Leukemia and Melanoma   COPD Father    Stroke Father    CAD Paternal Grandfather    CAD Maternal Grandfather    Heart disease Brother    Cancer Maternal Aunt        colon   Cancer Paternal Aunt        colon   Social History   Socioeconomic History   Marital status: Married    Spouse name: Not on file   Number of children: Not on file   Years of education: Not on file   Highest education level: Not on file  Occupational History   Not on file  Tobacco Use   Smoking status: Former    Current packs/day: 0.00    Types: Cigarettes    Quit date: 02/22/1978    Years since quitting: 45.3   Smokeless tobacco: Never  Substance and Sexual Activity   Alcohol use: Yes    Alcohol/week: 14.0 standard drinks of alcohol    Types: 14 Standard drinks or equivalent per week   Drug use: No   Sexual activity: Not on file  Other Topics Concern   Not on file  Social History Narrative   Not on file   Social Drivers of Health   Financial Resource Strain: Low Risk  (06/14/2023)   Overall Financial Resource Strain (CARDIA)    Difficulty of Paying Living Expenses: Not hard at all  Food Insecurity: No Food Insecurity (06/14/2023)   Hunger Vital Sign    Worried About Running Out of Food in the Last Year: Never true    Ran Out of Food in the Last Year: Never true  Transportation Needs: No Transportation Needs (06/14/2023)   PRAPARE - Administrator, Civil Service (Medical): No    Lack of Transportation (Non-Medical): No  Physical Activity: Sufficiently Active (06/14/2023)   Exercise Vital Sign    Days of Exercise per Week: 7 days    Minutes of Exercise per Session: 40 min  Stress: No  Stress Concern Present (06/14/2023)   [employer] of Occupational Health - Occupational Stress Questionnaire    Feeling of Stress : Not at all  Social Connections: Moderately Isolated (06/14/2023)   Social Connection and Isolation Panel [NHANES]    Frequency of Communication with Friends and Family: More than three times a week    Frequency of Social Gatherings with Friends and Family: More than three times a week    Attends Religious Services: Never    Database administrator or Organizations: No  Attends Banker Meetings: Never    Marital Status: Married    Tobacco Counseling Counseling given: Not Answered    Clinical Intake:  Pre-visit preparation completed: Yes  Pain : No/denies pain     BMI - recorded: 18.32 Nutritional Status: BMI <19  Underweight Diabetes: No  Lab Results  Component Value Date   HGBA1C 5.9 (H) 12/03/2022   HGBA1C 6.0 (H) 07/30/2022   HGBA1C 6.0 (H) 03/15/2022     How often do you need to have someone help you when you read instructions, pamphlets, or other written materials from your doctor or pharmacy?: 1 - Never  Interpreter Needed?: No  Information entered by :: Lamont Pilsner, LPN   Activities of Daily Living     06/14/2023    2:46 PM 12/04/2022   11:27 PM  In your present state of health, do you have any difficulty performing the following activities:  Hearing? 0 0  Vision? 0 0  Difficulty concentrating or making decisions? 0 0  Walking or climbing stairs? 0 0  Dressing or bathing? 0 0  Doing errands, shopping? 0 0  Preparing Food and eating ? N   Using the Toilet? N   In the past six months, have you accidently leaked urine? N   Do you have problems with loss of bowel control? N   Managing your Medications? N   Managing your Finances? N   Housekeeping or managing your Housekeeping? N     Patient Care Team: Anthon Kins, MD as PCP - General (Internal Medicine) Jolinda Necessary, MD (Inactive) as  Consulting Physician (Gastroenterology) Alphonsus Jeans, DDS Thais Fill, MD as Consulting Physician (Dermatology) Ronn Cohn, MD as Consulting Physician (Orthopedic Surgery)  Indicate any recent Medical Services you may have received from other than [facility] providers in the past year (date may be approximate).     Assessment:   This is a routine wellness examination for Zakyra.  Hearing/Vision screen Hearing Screening - Comments:: Pt denies any hearing issues  Vision Screening - Comments:: Wears rx glasses - up to date with routine eye exams with  Dr Ace Holder    Goals Addressed             This Visit's Progress    Patient Stated       Get more active       Depression Screen     06/14/2023    2:51 PM 05/24/2023   10:12 AM 12/04/2022   11:27 PM 07/29/2022   11:30 PM 03/15/2022    3:10 PM 12/12/2021    9:41 PM 09/07/2021    3:02 PM  PHQ 2/9 Scores  PHQ - 2 Score 0 0 0 0 0 0 0    Fall Risk     06/14/2023    2:53 PM 05/24/2023   10:12 AM 12/04/2022   11:27 PM 07/29/2022   11:30 PM 12/12/2021    9:41 PM  Fall Risk   Falls in the past year? 0 0 0 0 0  Number falls in past yr: 0 0     Injury with Fall? 0 0     Risk for fall due to : No Fall Risks No Fall Risks No Fall Risks No Fall Risks No Fall Risks  Follow up Falls prevention discussed Falls evaluation completed Falls prevention discussed;Falls evaluation completed;Education provided Falls prevention discussed;Education provided;Falls evaluation completed Falls prevention discussed;Education provided;Falls evaluation completed    MEDICARE RISK AT HOME:  Medicare Risk at Home Any stairs in  or around the home?: Yes If so, are there any without handrails?: No Home free of loose throw rugs in walkways, pet beds, electrical cords, etc?: Yes Adequate lighting in your home to reduce risk of falls?: Yes Life alert?: No Use of a cane, walker or w/c?: No Grab bars in the bathroom?: Yes Shower chair or bench in shower?:  No Elevated toilet seat or a handicapped toilet?: No  TIMED UP AND GO:  Was the test performed?  No  Cognitive Function: 6CIT completed        06/14/2023    2:53 PM  6CIT Screen  What Year? 0 points  What month? 0 points  What time? 0 points  Count back from 20 0 points  Months in reverse 0 points  Repeat phrase 2 points  Total Score 2 points    Immunizations Immunization History  Administered Date(s) Administered   Influenza, High Dose Seasonal PF 01/02/2013, 01/07/2014, 01/23/2015, 12/01/2015, 01/19/2017, 12/29/2017, 12/20/2018, 01/02/2020, 01/28/2021, 12/10/2021, 01/25/2023   Influenza-Unspecified 12/16/2011, 12/23/2016, 01/23/2017   PFIZER(Purple Top)SARS-COV-2 Vaccination 04/02/2019, 04/27/2019, 12/20/2019   PNEUMOCOCCAL CONJUGATE-20 01/28/2021   Pneumococcal Conjugate-13 01/07/2014   Pneumococcal Polysaccharide-23 08/07/2015   Pneumococcal-Unspecified 02/23/1995   Td 02/22/2005, 07/28/2016   Zoster, Live 12/16/2011    Screening Tests Health Maintenance  Topic Date Due   Zoster Vaccines- Shingrix (1 of 2) 06[birth year]09/1966   COVID-19 Vaccine (4 - 2024-25 season) 10/24/2022   Colonoscopy  02/18/2023   DEXA SCAN  03/26/2023   INFLUENZA VACCINE  09/23/2023   Medicare Annual Wellness (AWV)  06/13/2024   DTaP/Tdap/Td (3 - Tdap) 07/29/2026   Pneumonia Vaccine 71+ Years old  Completed   Hepatitis C Screening  Completed   HPV VACCINES  Aged Out   Meningococcal B Vaccine  Aged Out    Health Maintenance  Health Maintenance Due  Topic Date Due   Zoster Vaccines- Shingrix (1 of 2) 06[birth year]09/1964   COVID-19 Vaccine (4 - 2024-25 season) 10/24/2022   Colonoscopy  02/18/2023   DEXA SCAN  03/26/2023   Health Maintenance Items Addressed: See Nurse Notes  Additional Screening:  Vision Screening: Recommended annual ophthalmology exams for early detection of glaucoma and other disorders of the eye.  Dental Screening: Recommended annual dental exams for proper oral  hygiene  Community Resource Referral / Chronic Care Management: CRR required this visit?  No   CCM required this visit?  No     Plan:     I have personally reviewed and noted the following in the patient's chart:   Medical and social history Use of alcohol, tobacco or illicit drugs  Current medications and supplements including opioid prescriptions. Patient is not currently taking opioid prescriptions. Functional ability and status Nutritional status Physical activity Advanced directives List of other physicians Hospitalizations, surgeries, and ER visits in previous 12 months Vitals Screenings to include cognitive, depression, and falls Referrals and appointments  In addition, I have reviewed and discussed with patient certain preventive protocols, quality metrics, and best practice recommendations. A written personalized care plan for preventive services as well as general preventive health recommendations were provided to patient.     Bruno Capri, LPN   [id number]   After Visit Summary: (MyChart) Due to this being a telephonic visit, the after visit summary with patients personalized plan was offered to patient via MyChart   Notes: Nothing significant to report at this time.

## 2023-06-14 NOTE — Patient Instructions (Signed)
 Ms. Prestwood , Thank you for taking time to come for your Medicare Wellness Visit. I appreciate your ongoing commitment to your health goals. Please review the following plan we discussed and let me know if I can assist you in the future.   Referrals/Orders/Follow-Ups/Clinician Recommendations: maintain health and activity, continue to work on being more active   This is a list of the screening recommended for you and due dates:  Health Maintenance  Topic Date Due   Zoster (Shingles) Vaccine (1 of 2) 06[birth year]09/1966   COVID-19 Vaccine (4 - 2024-25 season) 10/24/2022   Colon Cancer Screening  02/18/2023   DEXA scan (bone density measurement)  03/26/2023   Flu Shot  09/23/2023   Medicare Annual Wellness Visit  06/13/2024   DTaP/Tdap/Td vaccine (3 - Tdap) 07/29/2026   Pneumonia Vaccine  Completed   Hepatitis C Screening  Completed   HPV Vaccine  Aged Out   Meningitis B Vaccine  Aged Out    Advanced directives: (Declined) Advance directive discussed with you today. Even though you declined this today, please call our office should you change your mind, and we can give you the proper paperwork for you to fill out.  Next Medicare Annual Wellness Visit scheduled for next year: Yes

## 2023-08-03 ENCOUNTER — Ambulatory Visit: Admitting: Internal Medicine

## 2023-08-03 ENCOUNTER — Encounter: Payer: Self-pay | Admitting: Internal Medicine

## 2023-08-03 VITALS — BP 126/70 | HR 66 | Temp 97.8°F | Ht 67.0 in | Wt 117.6 lb

## 2023-08-03 DIAGNOSIS — R0989 Other specified symptoms and signs involving the circulatory and respiratory systems: Secondary | ICD-10-CM

## 2023-08-03 DIAGNOSIS — I2584 Coronary atherosclerosis due to calcified coronary lesion: Secondary | ICD-10-CM

## 2023-08-03 DIAGNOSIS — M25462 Effusion, left knee: Secondary | ICD-10-CM

## 2023-08-03 DIAGNOSIS — E782 Mixed hyperlipidemia: Secondary | ICD-10-CM | POA: Diagnosis not present

## 2023-08-03 DIAGNOSIS — R7303 Prediabetes: Secondary | ICD-10-CM | POA: Diagnosis not present

## 2023-08-03 DIAGNOSIS — I2510 Atherosclerotic heart disease of native coronary artery without angina pectoris: Secondary | ICD-10-CM | POA: Diagnosis not present

## 2023-08-03 DIAGNOSIS — E039 Hypothyroidism, unspecified: Secondary | ICD-10-CM

## 2023-08-03 DIAGNOSIS — Z0001 Encounter for general adult medical examination with abnormal findings: Secondary | ICD-10-CM | POA: Diagnosis not present

## 2023-08-03 DIAGNOSIS — I700 Atherosclerosis of aorta: Secondary | ICD-10-CM

## 2023-08-03 LAB — COMPREHENSIVE METABOLIC PANEL WITH GFR
ALT: 21 U/L (ref 0–35)
AST: 21 U/L (ref 0–37)
Albumin: 4.5 g/dL (ref 3.5–5.2)
Alkaline Phosphatase: 57 U/L (ref 39–117)
BUN: 10 mg/dL (ref 6–23)
CO2: 27 meq/L (ref 19–32)
Calcium: 9.4 mg/dL (ref 8.4–10.5)
Chloride: 103 meq/L (ref 96–112)
Creatinine, Ser: 0.5 mg/dL (ref 0.40–1.20)
GFR: 91.28 mL/min (ref >60.00)
Glucose, Bld: 82 mg/dL (ref 70–99)
Potassium: 4.2 meq/L (ref 3.5–5.1)
Sodium: 139 meq/L (ref 135–145)
Total Bilirubin: 0.5 mg/dL (ref 0.2–1.2)
Total Protein: 7.5 g/dL (ref 6.0–8.3)

## 2023-08-03 LAB — CBC WITH DIFFERENTIAL/PLATELET
Basophils Absolute: 0.1 10*3/uL (ref 0.0–0.1)
Basophils Relative: 0.9 % (ref 0.0–3.0)
Eosinophils Absolute: 0.2 10*3/uL (ref 0.0–0.7)
Eosinophils Relative: 3.3 % (ref 0.0–5.0)
HCT: 40 % (ref 36.0–46.0)
Hemoglobin: 13.2 g/dL (ref 12.0–15.0)
Lymphocytes Relative: 22.5 % (ref 12.0–46.0)
Lymphs Abs: 1.7 10*3/uL (ref 0.7–4.0)
MCHC: 32.9 g/dL (ref 30.0–36.0)
MCV: 90.8 fl (ref 78.0–100.0)
Monocytes Absolute: 0.6 10*3/uL (ref 0.1–1.0)
Monocytes Relative: 7.7 % (ref 3.0–12.0)
Neutro Abs: 4.9 10*3/uL (ref 1.4–7.7)
Neutrophils Relative %: 65.6 % (ref 43.0–77.0)
Platelets: 293 10*3/uL (ref 150.0–400.0)
RBC: 4.41 Mil/uL (ref 3.87–5.11)
RDW: 13.2 % (ref 11.5–15.5)
WBC: 7.5 10*3/uL (ref 4.0–10.5)

## 2023-08-03 LAB — LIPID PANEL
Cholesterol: 177 mg/dL (ref 0–200)
HDL: 68.2 mg/dL (ref >39.00)
LDL Cholesterol: 83 mg/dL (ref 0–99)
NonHDL: 109.26
Total CHOL/HDL Ratio: 3
Triglycerides: 131 mg/dL (ref 0.0–149.0)
VLDL: 26.2 mg/dL (ref 0.0–40.0)

## 2023-08-03 LAB — HEMOGLOBIN A1C: Hgb A1c MFr Bld: 5.9 % (ref 4.6–6.5)

## 2023-08-03 MED ORDER — ROSUVASTATIN CALCIUM 20 MG PO TABS: 20.0000 mg | ORAL_TABLET | Freq: Every day | ORAL | 1 refills | Status: DC

## 2023-08-03 NOTE — Patient Instructions (Addendum)
 VISIT SUMMARY:  You came in today for your annual physical exam. We discussed your history of glaucoma, sinus issues, sleep quality, and your supplement regimen. We also reviewed your vaccination status, upcoming colonoscopy, and cardiovascular health. Additionally, we talked about your prediabetes management and knee pain.  YOUR PLAN:  -AORTIC ATHEROSCLEROSIS: Aortic atherosclerosis is a condition where plaque builds up in the aorta, leading to suboptimal LDL levels and an increased risk of heart attack. We recommend increasing your rosuvastatin  dosage if you can tolerate it, making dietary changes like eating more avocados and extra virgin olive oil, and regularly monitoring your cholesterol levels. Lowering your LDL below 40 may reduce plaque and halve your risk of a heart attack.  -PREDIABETES: Prediabetes is a condition where your blood sugar levels are higher than normal but not high enough to be classified as diabetes. We aim to keep your A1c below 6.1. We will order an A1c test to monitor your blood glucose levels. Reducing your intake of chips and other junk food will help manage your blood sugar.  -KNEE OSTEOARTHRITIS: Knee osteoarthritis is a condition where the cartilage in your knee wears down, causing pain and stiffness. Your left knee pain is occasional and does not currently require intervention. Continue regular walking to manage your symptoms and monitor for any increase in pain or changes in knee function.  -GENERAL HEALTH MAINTENANCE: For your general health, continue with regular eye exams every 1-2 years and dental care every six months. Use saline rinses for your sinus issues and monitor your sleep quality, reducing alcohol intake to improve sleep. Continue taking calcium  and vitamin D  supplements for bone health. We recommend the shingles vaccine (Shingrix) and will discuss the benefits of the RSV vaccine. Aim for 150 minutes of exercise per week and consider B12 supplementation,  especially sublingual, to address deficiency and reduce fall risk.  INSTRUCTIONS:  Please schedule follow-up appointments as needed. We will order routine blood work, including cholesterol, metabolic panel, and thyroid  function tests. Your next colonoscopy is due in December or January. We will also order an A1c test to monitor your blood glucose levels.    ALLERGY MANAGEMENT PLAN  This plan is designed to help manage your allergic rhinitis (nasal allergies) effectively. Follow these steps daily for best results.  Sinus saline sprays- use nightly, and after sneezing episodes or exposure to allergen.  Insert deeply and spray mist into nose while leaning over sink at 45 degrees,  while gently breathing. Also blow out onto tissue while leaning forward 45 degrees. Once daily, after a sinus rinse, use sensimist.  Just before bedtime is best. This only needed if allergies acting up.  If this is inadequate add-on once daily for levocetirizine / xyzal 5 mg for nondrowsy antihistamine Take benadryl 25 mg at bedtime also if allergic mucus is persisting  When allergies cause chronic swelling in sinuses, it leads to sinus infections:    DAILY TREATMENT ROUTINE   Time of Day Treatment Steps  Morning 1. Saline Nasal Spray - Use to cleanse nasal passages 2. Xyzal (levocetirizine) - Take one tablet daily   Throughout Day Saline Nasal Spray - Use 2 additional times (mid-day and afternoon)   Evening/Bedtime 1. Saline Nasal Rinse - Thoroughly clean nasal passages 2. Flonase  Sensimist - Apply after nasal rinse 3. Benadryl (diphenhydramine) - Take 25mg  if experiencing persistent congestion    PROPER TECHNIQUE GUIDE       Saline Nasal Spray/Rinse Technique: Lean forward over sink at a 45-degree angle Turn  head slightly to one side Insert spray tip into upper nostril Spray gently while breathing lightly through your nose Repeat on other side Gently blow nose to clear excess solution Use saline  spray 3 times daily to keep nasal passages moist and clear allergens.       Flonase  Sensimist Technique: Shake bottle gently before each use Prime the bottle if it's new or hasn't been used for a week Tilt your head forward slightly Insert tip into nostril, pointing away from the center of your nose Spray while inhaling gently Repeat in other nostril Use Flonase  Sensimist once daily, preferably at bedtime after using saline rinse. It may take several days of regular use to feel maximum benefit.   WHY FLONASE  SENSIMIST?   Benefits of Flonase  Sensimist:  Alcohol-free and scent-free formula - gentler on sensitive nasal passages Fine mist application - more comfortable with less dripping down throat Effectively relieves nasal congestion, sneezing, runny nose, and even eye symptoms 24-hour relief with once-daily dosing Uses a more potent form of fluticasone  that works at a lower dose Less liquid per spray means less discomfort  UNDERSTANDING YOUR MEDICATIONS   Medication How It Works Important Notes  Flonase  Sensimist (fluticasone  furoate) Reduces inflammation in nasal passages, addressing the underlying cause of allergy symptoms - Takes several days for full effect - Use daily for best results - Safe for long-term use   Xyzal (levocetirizine) Blocks histamine to reduce allergy symptoms like sneezing and itching - Take at the same time each day - May cause drowsiness in some people - Once-daily dosing   Benadryl (diphenhydramine) Antihistamine that provides additional relief for breakthrough symptoms - Causes drowsiness - Use only at bedtime - For occasional use when needed   Saline Spray/Rinse Physically removes allergens and moistens nasal passages - Safe to use frequently - Improves effectiveness of other treatments - Reduces nasal irritation    CONTACT YOUR PROVIDER IF: Your symptoms do not improve after 1-2 weeks of following this plan You develop sinus pain with fever or  green/yellow discharge You experience frequent nosebleeds You develop new or worsening symptoms You have questions about your treatment plan     ADDITIONAL ALLERGY MANAGEMENT TIPS   HELPFUL STRATEGIES: ?? Keep windows closed during high pollen seasons ??? Use allergen-proof covers for pillows and mattresses ?? Vacuum regularly with a HEPA filter vacuum ?? Shower and change clothes after spending time outdoors ?? Check local pollen counts and limit outdoor time when counts are high ?? Stay well-hydrated to help keep mucous membranes moist

## 2023-08-03 NOTE — Progress Notes (Signed)
 [facility] at [facility] [address] [place], [place] [address] Office:  [phone]  -- Annual Preventive Medical Office Visit --  Patient:  Katie Morton      Age: 78 y.o.       Sex:  female  Date:   08/03/2023 Patient Care Team: Anthon Kins, MD as PCP - General (Internal Medicine) Jolinda Necessary, MD (Inactive) as Consulting Physician (Gastroenterology) Alphonsus Jeans, DDS Thais Fill, MD as Consulting Physician (Dermatology) Ronn Cohn, MD as Consulting Physician (Orthopedic Surgery) Today's Healthcare Provider: Anthon Kins, MD  ========================================= Chief complaint: Annual Exam (Pt is present for cpe not fasting.)  Purpose of Visit: Comprehensive preventive health assessment and personalized health maintenance planning.  This encounter was conducted as a Comprehensive Physical Exam (CPE) preventive care annual visit. The patient's medical history and problem list were reviewed to inform individualized preventive care recommendations.  No problem-specific medical treatment enough to warrant a separate additional service charge was provided during this visit.     Assessment & Plan Encounter for annual general medical examination with abnormal findings in adult Regular eye exams every 1-2 years and dental care every six months are encouraged. Recommend saline rinses for sinus care and sleep monitoring to prevent disorders like sleep apnea. Advise reducing alcohol intake to improve sleep quality. Continue calcium  and vitamin D  supplementation for bone health. Recommend the shingles vaccine (Shingrix) and discuss RSV vaccine benefits for older adults. Encourage regular exercise, aiming for 150 minutes per week. Advise on B12 supplementation, especially sublingual, to address deficiency and reduce fall risk.  Follow-up   Regular follow-up is important for health maintenance and chronic condition management. Schedule follow-up  appointments as needed. Order routine blood work, including cholesterol, metabolic panel, and thyroid  function tests. Coronary artery disease due to calcified coronary lesion Will order lab testing to guide management.  Coronary artery calcification - LAD CCS 43.4 09/2020 Will order lab testing to guide management.  Mixed hyperlipidemia Will order lab testing to guide management.  Hypothyroidism, unspecified type Will order lab testing to guide management.  Labile hypertension Will order lab testing to guide management.  Prediabetes Prediabetes management aims to maintain an A1c below 6.1. Order an A1c test to monitor blood glucose levels. Advise reducing intake of chips and other junk food to manage blood glucose. Aortic atherosclerosis (HCC) - per CT 09/2020 Aortic atherosclerosis presents with suboptimal LDL levels and a 17.5% ten-year myocardial infarction risk. Increase rosuvastatin  dosage if tolerated to lower LDL levels. Dietary modifications, including increased intake of avocados and extra virgin olive oil, are recommended. Regular cholesterol monitoring is necessary. Lowering LDL below 40 may reduce plaque and halve myocardial infarction risk. Swelling of left knee Left knee osteoarthritis causes occasional pain, with evidence of a blown knee capsule. Pain does not warrant intervention. Advise continuing regular walking to manage symptoms and monitor for any increase in pain or changes in knee function.   ORDERS:    Orders Placed This Encounter  Procedures   Lipid panel        Has the patient fasted?:   No    Release to patient:   Immediate [1]   Comprehensive metabolic panel with GFR    Has the patient fasted?:   No    Release to patient:   Immediate [1]   CBC with Differential/Platelet    Release to patient:   Immediate [1]   TSH Rfx on Abnormal to Free T4   HgB A1c   Meds  ordered this encounter  Medications   rosuvastatin  (CRESTOR ) 20 MG tablet    Sig: Take 1  tablet (20 mg total) by mouth daily.    Dispense:  90 tablet    Refill:  1      All of the following was reviewed with patient during today's Comprehensive Physical Exam (CPE) preventive care annual visit: HEALTH MAINTENANCE COUNSELING AND ANTICIPATORY GUIDANCE  Reviewed the following verbally with patient and provided AVS materials: Preventive Measure Recommendation  Eye Exams Every 1-2 years  Dental Care Cleanings every 6 months or more, brush/floss 3x daily  Sinus Care Saline spray rinses daily encouraged   Sleep 8 hours nightly, good sleep hygiene, e-monitoring if any daytime drowsiness  Diet Fruits/vegetables/fiber/healthy fats, balance and moderation  Exercise 150 minutes weekly  Risk Behaviors Discouraged any/all high risk behaviors. She denies any except would like to reduce alcohol.    Bone Health Recommended to maintain a good source of calcium  and vitamin D  in diet.  She denies any personal history of early osteoporosis or fragility fractures  Anticipatory Counseling Recommend absolute abstinence from all vaped or smoked recreational or illicit substances of abuse such as tobacco, nicotine, alcohol, illicit drugs, even sugar.   Safe driving / no text and driving.  Health Maintenance Health Maintenance Due  Topic Date Due   Zoster Vaccines- Shingrix (1 of 2) 06/09/1966   DEXA SCAN  03/26/2023  She is open to shingles at pharmacy  She is open to RSV pharmacy. Health Maintenance  Topic Date Due   Zoster Vaccines- Shingrix (1 of 2) 06[birth year]09/1966   DEXA SCAN  03/26/2023   Colonoscopy  08/02/2024 (Originally 02/18/2023)   COVID-19 Vaccine (8 - Pfizer risk 2024-25 season) 08/09/2023   INFLUENZA VACCINE  09/23/2023   Medicare Annual Wellness (AWV)  06/13/2024   DTaP/Tdap/Td (3 - Tdap) 07/29/2026   Pneumococcal Vaccine: 50+ Years  Completed   Hepatitis C Screening  Completed   HPV VACCINES  Aged Out   Meningococcal B Vaccine  Aged Out  Reviewed/Encouraged completion.   Immunizations Immunization History  Administered Date(s) Administered   Influenza, High Dose Seasonal PF 01/02/2013, 01/07/2014, 01/23/2015, 12/01/2015, 01/19/2017, 12/29/2017, 12/20/2018, 01/02/2020, 01/28/2021, 12/10/2021, 01/25/2023   Influenza-Unspecified 03/04/1999, 12/28/2000, 12/27/2001, 12/16/2011, 12/23/2016, 01/23/2017   PFIZER(Purple Top)SARS-COV-2 Vaccination 04/02/2019, 04/27/2019, 12/20/2019   PNEUMOCOCCAL CONJUGATE-20 01/28/2021   Pfizer Covid-19 Vaccine Bivalent Booster 81yrs & up 10/30/2020   Pfizer(Comirnaty)Fall Seasonal Vaccine 12 years and older 02/26/2022, 10/06/2022, 02/08/2023   Pneumococcal Conjugate-13 01/07/2014   Pneumococcal Polysaccharide-23 08/07/2015   Pneumococcal-Unspecified 02/23/1995   Td 02/22/2005, 07/28/2016   Zoster, Live 12/16/2011  Reviewed.   CANCER SCREENING SHARED DECISION MAKING   Colon HM Colonoscopy          Upcoming     Colonoscopy (Yearly) Postponed until 08/02/2024    02/17/2022  HM Colonoscopy component of HM COLONOSCOPY                 .   Colonoscopy  08/02/2024 (Originally 02/18/2023)    Lung Current guidelines recommend individuals aged 54 to 77 who currently smoke or formerly smoked and have a >= 20 pack-year smoking history should undergo annual screening with low-dose computed tomography (LDCT). Social History   Tobacco Use  Smoking Status Former   Current packs/day: 0.00   Types: Cigarettes   Quit date: 02/22/1978   Years since quitting: 45.4  Smokeless Tobacco Never  Denies smoking  a pack-daily for 20 years.  Tobacco Use: Medium Risk (08/03/2023)   Patient  History    Smoking Tobacco Use: Former    Smokeless Tobacco Use: Never    Passive Exposure: Not on file    Skin Advised regular sunscreen use. Patient denies worrisome, changing, or new skin lesions. Offered to include images in chart for surveillance.   Gynecological No Cervical Cancer Screening results to display.  HM Mammogram   This patient has  no relevant Health Maintenance data.   @IMGMAMMORECDUEDATE @MM  3D SCREENING MAMMOGRAM BILATERAL BREAST Result Date: 05/16/2023 CLINICAL DATA:  Screening. EXAM: DIGITAL SCREENING BILATERAL MAMMOGRAM WITH TOMOSYNTHESIS AND CAD TECHNIQUE: Bilateral screening digital craniocaudal and mediolateral oblique mammograms were obtained. Bilateral screening digital breast tomosynthesis was performed. The images were evaluated with computer-aided detection. COMPARISON:  Previous exam(s). ACR Breast Density Category b: There are scattered areas of fibroglandular density. FINDINGS: There are no findings suspicious for malignancy. IMPRESSION: No mammographic evidence of malignancy. A result letter of this screening mammogram will be mailed directly to the patient. RECOMMENDATION: Screening mammogram in one year. (Code:SM-B-01Y) BI-RADS CATEGORY  1: Negative. Electronically Signed   By: Amanda Jungling M.D.   On: 05/16/2023 12:29   MM 3D SCREEN BREAST BILATERAL Result Date: 05/12/2022 CLINICAL DATA:  Screening. EXAM: DIGITAL SCREENING BILATERAL MAMMOGRAM WITH TOMOSYNTHESIS AND CAD TECHNIQUE: Bilateral screening digital craniocaudal and mediolateral oblique mammograms were obtained. Bilateral screening digital breast tomosynthesis was performed. The images were evaluated with computer-aided detection. COMPARISON:  Previous exam(s). ACR Breast Density Category b: There are scattered areas of fibroglandular density. FINDINGS: There are no findings suspicious for malignancy. IMPRESSION: No mammographic evidence of malignancy. A result letter of this screening mammogram will be mailed directly to the patient. RECOMMENDATION: Screening mammogram in one year. (Code:SM-B-01Y) BI-RADS CATEGORY  1: Negative. Electronically Signed   By: Serena  Chacko M.D.   On: 05/12/2022 15:37   MM 3D SCREEN BREAST BILATERAL Result Date: 03/25/2021 CLINICAL DATA:  Screening. EXAM: DIGITAL SCREENING BILATERAL MAMMOGRAM WITH TOMOSYNTHESIS AND CAD  TECHNIQUE: Bilateral screening digital craniocaudal and mediolateral oblique mammograms were obtained. Bilateral screening digital breast tomosynthesis was performed. The images were evaluated with computer-aided detection. COMPARISON:  Previous exam(s). ACR Breast Density Category b: There are scattered areas of fibroglandular density. FINDINGS: There are no findings suspicious for malignancy. IMPRESSION: No mammographic evidence of malignancy. A result letter of this screening mammogram will be mailed directly to the patient. RECOMMENDATION: Screening mammogram in one year. (Code:SM-B-01Y) BI-RADS CATEGORY  1: Negative. Electronically Signed   By: Dina  Arceo M.D.   On: 03/25/2021 15:26   MM 3D SCREEN BREAST BILATERAL Result Date: 02/13/2020 CLINICAL DATA:  Screening. EXAM: DIGITAL SCREENING BILATERAL MAMMOGRAM WITH TOMO AND CAD COMPARISON:  Previous exam(s). ACR Breast Density Category b: There are scattered areas of fibroglandular density. FINDINGS: There are no findings suspicious for malignancy. Images were processed with CAD. IMPRESSION: No mammographic evidence of malignancy. A result letter of this screening mammogram will be mailed directly to the patient. RECOMMENDATION: Screening mammogram in one year. (Code:SM-B-01Y) BI-RADS CATEGORY  1: Negative. Electronically Signed   By: Amanda Jungling M.D.   On: 02/13/2020 15:44   MM 3D SCREEN BREAST BILATERAL Result Date: 12/11/2018 CLINICAL DATA:  Screening. EXAM: DIGITAL SCREENING BILATERAL MAMMOGRAM WITH TOMO AND CAD COMPARISON:  Previous exam(s). ACR Breast Density Category b: There are scattered areas of fibroglandular density. FINDINGS: There are no findings suspicious for malignancy. Images were processed with CAD. IMPRESSION: No mammographic evidence of malignancy. A result letter of this screening mammogram will be mailed directly to the patient. RECOMMENDATION: Screening mammogram  in one year. (Code:SM-B-01Y) BI-RADS CATEGORY  1: Negative.  Electronically Signed   By: Dobrinka  Dimitrova M.D.   On: 12/11/2018 13:15   Cervical, breast, uterine, and ovarian cancer screenings require a dedicated well-woman exam with a gynecology specialist which were not included in today's annual primary care preventive visit.  Safe sex is encouraged, but routine urinalysis sexual transmitted infection screenings in asymptomatic patients are not recommended per guidelines.  Patient verbally acknowledged understanding and intent to maintain routine gynecology appointment(s).    Thyroid  Palpated thyroid  - no nodules of concern(s).  Other Cancers Discussed lack of screening guidelines and insurance coverage for other cancer types.   Medications: not yet discussed Lab Results  Component Value Date   HDL 68.20 08/03/2023   HDL 73 12/03/2022   HDL 71 07/30/2022   CHOLHDL 3 08/03/2023   CHOLHDL 2.6 12/03/2022   CHOLHDL 2.5 07/30/2022   Lab Results  Component Value Date   LDLCALC 83 08/03/2023   LDLCALC 91 12/03/2022   LDLCALC 82 07/30/2022   Lab Results  Component Value Date   TRIG 131.0 08/03/2023   TRIG 163 (H) 12/03/2022   TRIG 110 07/30/2022   Lab Results  Component Value Date   CHOL 177 08/03/2023   CHOL 190 12/03/2022   CHOL 174 07/30/2022   The 10-year ASCVD risk score (Arnett DK, et al., 2019) is: 17.4%   Values used to calculate the score:     Age: 49 years     Clincally relevant sex: Female     Is Non-Hispanic African American: No     Diabetic: No     Tobacco smoker: No     Systolic Blood Pressure: 126 mmHg     Is BP treated: No     HDL Cholesterol: 68.2 mg/dL     Total Cholesterol: 177 mg/dL Lab Results  Component Value Date   ALT 21 08/03/2023   AST 21 08/03/2023   ALKPHOS 57 08/03/2023   TSH 3.660 08/03/2023   HGBA1C 5.9 08/03/2023   Body mass index is 18.42 kg/m.  Lipoprotein(a), Apolipoprotein B (ApoB), and High-sensitivity C-reactive protein (hs-CRP) No results found for: HSCRP, LIPOA Improving Your  Cholesterol: Diet: Focus on a Mediterranean-style diet, limit saturated fats and sugars, and increase omega-3 fatty acids (fish, flaxseeds,nuts,extra virgin olive oil, avocados). Exercise: Engage in regular physical activity (aerobic exercises are particularly beneficial for HDL). Weight Management: Maintain a healthy weight. Smoking Cessation: Quitting smoking improves cholesterol levels.    Discussed the use of AI scribe software for clinical note transcription with the patient, who gave verbal consent to proceed.  History of Present Illness  78 year old female who presents for an annual physical exam.  She has a history of glaucoma and adheres to eye exams every one to two years. She also maintains regular dental care every six months.  She experiences sinus issues, particularly during pollen season in [place]. Her sleep is generally adequate, but alcohol consumption affects her sleep quality. She does not engage in other high-risk behaviors such as smoking or drug use.  She takes calcium  and vitamin D  supplements, adjusting the vitamin D  dosage as needed based on recent levels. She has had a DEXA scan with good results.  She has received the first shingles vaccine despite never having had chickenpox. Her husband has had shingles, which influences her decision to vaccinate.  She is due for a colonoscopy in December or January, having previously had polyps removed. She is aware of the RSV vaccine  but is uncertain about insurance coverage.  She quit smoking approximately 45 years ago after smoking a pack a day for a period. She is aware that insurance may not cover lung cancer screening due to the time since quitting.  She has a history of LAD calcification and is on rosuvastatin  5 mg daily. She also takes aspirin  and consumes avocados and extra virgin olive oil for cardiovascular health.  She has a history of tonsillitis and a perforated left eardrum from childhood. She experiences  occasional knee pain, particularly in the left knee, but it is not severe enough to warrant intervention.  She has prediabetes and is interested in monitoring her A1c levels, aiming to reduce her intake of chips and other junk food to manage her condition better.   ROS A comprehensive ROS was negative for any concerning symptoms.   Completed medication reconciliation: Current Outpatient Medications on File Prior to Visit  Medication Sig   aspirin  EC 81 MG tablet Take 81 mg by mouth daily.   Calcium  Citrate-Vitamin D  (CALCIUM  CITRATE + D PO) Take by mouth daily.   Cholecalciferol (VITAMIN D  PO) Take 2,000 Units by mouth daily.   CINNAMON PO Take 1,000 mg by mouth 2 (two) times daily.   COMIRNATY syringe    denosumab  (PROLIA ) 60 MG/ML SOSY injection Inject 60 mg into the skin every 6 (six) months.   dorzolamide (TRUSOPT) 2 % ophthalmic solution 3 (three) times daily.   fish oil-omega-3 fatty acids 1000 MG capsule Take 1 g by mouth daily.    levothyroxine  (SYNTHROID ) 50 MCG tablet TAKE 1/2 TABLET DAILY ON AN EMPTY STOMACH FOR 30 MIN   Multiple Vitamins-Minerals (MULTIVITAMIN WITH MINERALS) tablet Take 1 tablet by mouth daily.   Netarsudil-Latanoprost (ROCKLATAN) 0.02-0.005 % SOLN Place 1 drop into the left eye daily.   rosuvastatin  (CRESTOR ) 10 MG tablet Take 1 tablet (10 mg total) by mouth daily.   VITAMIN K PO Take by mouth.   YF-VAX injection    doxycycline  (VIBRA -TABS) 100 MG tablet Take 1 tablet (100 mg total) by mouth 2 (two) times daily. (Patient not taking: Reported on 08/03/2023)   No current facility-administered medications on file prior to visit.  There are no discontinued medications.The following were reviewed and/or entered/updated into our electronic MEDICAL RECORD NUMBERPast Medical History:  Diagnosis Date   Arthritis    Cancer (HCC) 2016   squamous cell skin cancer    COPD (chronic obstructive pulmonary disease) (HCC)    Elevated hemoglobin A1c    Femur fracture (HCC)  09/20/2012   Hyperlipidemia    Labile hypertension    Osteoporosis    Villous adenoma 2008   Past Surgical History:  Procedure Laterality Date   HIP PINNING,CANNULATED Left 09/19/2012   Procedure: Percutaneous Screw Fixation Left Hip Fracture;  Surgeon: Derald Flattery, MD;  Location: [facility] OR;  Service: Orthopedics;  Laterality: Left;   MANDIBLE SURGERY     TONSILLECTOMY     Social History   Socioeconomic History   Marital status: Married    Spouse name: Not on file   Number of children: Not on file   Years of education: Not on file   Highest education level: Not on file  Occupational History   Not on file  Tobacco Use   Smoking status: Former    Current packs/day: 0.00    Types: Cigarettes    Quit date: 02/22/1978    Years since quitting: 45.4   Smokeless tobacco: Never  Substance and Sexual Activity  Alcohol use: Yes    Alcohol/week: 14.0 standard drinks of alcohol    Types: 14 Standard drinks or equivalent per week   Drug use: No   Sexual activity: Not on file  Other Topics Concern   Not on file  Social History Narrative   Not on file   Social Drivers of Health   Financial Resource Strain: Low Risk  (06/14/2023)   Overall Financial Resource Strain (CARDIA)    Difficulty of Paying Living Expenses: Not hard at all  Food Insecurity: No Food Insecurity (06/14/2023)   Hunger Vital Sign    Worried About Running Out of Food in the Last Year: Never true    Ran Out of Food in the Last Year: Never true  Transportation Needs: No Transportation Needs (06/14/2023)   PRAPARE - Administrator, Civil Service (Medical): No    Lack of Transportation (Non-Medical): No  Physical Activity: Sufficiently Active (06/14/2023)   Exercise Vital Sign    Days of Exercise per Week: 7 days    Minutes of Exercise per Session: 40 min  Stress: No Stress Concern Present (06/14/2023)   [employer] of Occupational Health - Occupational Stress Questionnaire    Feeling of  Stress : Not at all  Social Connections: Moderately Isolated (06/14/2023)   Social Connection and Isolation Panel    Frequency of Communication with Friends and Family: More than three times a week    Frequency of Social Gatherings with Friends and Family: More than three times a week    Attends Religious Services: Never    Database administrator or Organizations: No    Attends Banker Meetings: Never    Marital Status: Married  Catering manager Violence: Not At Risk (06/14/2023)   Humiliation, Afraid, Rape, and Kick questionnaire    Fear of Current or Ex-Partner: No    Emotionally Abused: No    Physically Abused: No    Sexually Abused: No      06/14/2023    2:50 PM  Alcohol Use Disorder Test (AUDIT)  1. How often do you have a drink containing alcohol? 4  2. How many drinks containing alcohol do you have on a typical day when you are drinking? 1  3. How often do you have six or more drinks on one occasion? 0  AUDIT-C Score 5  4. How often during the last year have you found that you were not able to stop drinking once you had started? 0  5. How often during the last year have you failed to do what was normally expected from you because of drinking? 0  6. How often during the last year have you needed a first drink in the morning to get yourself going after a heavy drinking session? 0  7. How often during the last year have you had a feeling of guilt of remorse after drinking? 0  8. How often during the last year have you been unable to remember what happened the night before because you had been drinking? 0  9. Have you or someone else been injured as a result of your drinking? 0  10. Has a relative or friend or a doctor or another health worker been concerned about your drinking or suggested you cut down? 0  Alcohol Use Disorder Identification Test Final Score (AUDIT) 5   Family History  Problem Relation Age of Onset   Hypertension Mother    Diabetes Mother    Mitral  valve prolapse Mother  Heart disease Mother    Cancer - Other Father        Leukemia and Melanoma   COPD Father    Stroke Father    CAD Paternal Grandfather    CAD Maternal Grandfather    Heart disease Brother    Cancer Maternal Aunt        colon   Cancer Paternal Aunt        colon   Allergies  Allergen Reactions   Brimonidine Itching   Silver Sulfadiazine    Social History   Substance and Sexual Activity  Sexual Activity Not on file   Social History   Tobacco Use   Smoking status: Former    Current packs/day: 0.00    Types: Cigarettes    Quit date: 02/22/1978    Years since quitting: 45.4   Smokeless tobacco: Never  Substance Use Topics   Alcohol use: Yes    Alcohol/week: 14.0 standard drinks of alcohol    Types: 14 Standard drinks or equivalent per week   Drug use: No      08/03/2023   11:00 AM  Depression screen PHQ 2/9  Decreased Interest 0  Down, Depressed, Hopeless 0  PHQ - 2 Score 0      06/14/2023    2:53 PM  Fall Risk   Falls in the past year? 0  Number falls in past yr: 0  Injury with Fall? 0  Risk for fall due to : No Fall Risks  Follow up Falls prevention discussed     BP 126/70   Pulse 66   Temp 97.8 F (36.6 C) (Temporal)   Ht 5' 7 (1.702 m)   Wt 117 lb 9.6 oz (53.3 kg)   SpO2 98%   BMI 18.42 kg/m  BP Readings from Last 3 Encounters:  08/03/23 126/70  05/24/23 126/76  12/03/22 138/68   Wt Readings from Last 10 Encounters:  08/03/23 117 lb 9.6 oz (53.3 kg)  06/14/23 117 lb (53.1 kg)  05/24/23 117 lb 12.8 oz (53.4 kg)  12/03/22 119 lb 6.4 oz (54.2 kg)  07/30/22 123 lb 6.4 oz (56 kg)  03/15/22 120 lb 12.8 oz (54.8 kg)  12/10/21 118 lb (53.5 kg)  09/07/21 116 lb 12.8 oz (53 kg)  08/05/21 114 lb (51.7 kg)  06/02/21 116 lb 3.2 oz (52.7 kg)  Physical Exam GEN: No acute distress, resting comfortably. HEENT: Scar on left eardrum. Nose straight with decreased airflow on the left side.oropharynx clear, no thyromegaly noted, no  palpable lymphadenopathy or thyroid  nodules. CARDIOVASCULAR: S1 and S2 heart sounds with regular rate and rhythm, no murmurs appreciated. PULMONARY: Normal work of breathing, clear to auscultation bilaterally, no crackles, wheezes, or rhonchi. ABDOMEN: Soft, nontender, nondistended. MSK: No edema, cyanosis, or clubbing noted. loss of subcutaneous fat in arms. Knees appear different bilaterally with likely compromised left knee capsule. SKIN: Warm, dry, no lesions of concern observed. NEUROLOGICAL: Cranial nerves II-XII grossly intact, strength 5/5 in upper and lower extremities, reflexes symmetric and intact bilaterally.Difficulty with heel to toe walk, possible B12 deficiency. PSYCH: Normal affect and thought content, pleasant and cooperative.      ======================================  Notes:  This document was synthesized by artificial intelligence ([employer]bridge) using HIPAA-compliant recording of the clinical interaction;   We discussed the use of AI scribe software for clinical note transcription with the patient, who gave verbal consent to proceed.    This encounter employed state-of-the-art, real-time, collaborative documentation. The patient was empowered to actively review and assist  in updating their electronic medical record on a shared monitor, ensuring transparency and improving accuracy.    Prior to and at the beginning of Comprehensive Physical Exam (CPE) preventive care annual visit appointment types  we clarify to patients Our goal today is to focus on your preventive or annual Comprehensive Physical Exam (CPE) preventive care annual visit, which typically covers routine screenings and overall health maintenance. However, if you share any new or concerning symptoms--such as dizziness, passing out, severe pain, or anything else that may point to a more serious issue--we are both legally and ethically required to evaluate it. We cannot simply overlook or ignore such concerns, even if  you later decide you don't want to discuss them, because it could jeopardize your health.  If addressing a new concern takes us  beyond the scope of the preventive visit, we may need to bill separately for that portion of care. We understand financial considerations are important, and we're happy to discuss your options if something new comes up. However, we want to be clear that once you mention a potentially serious issue, we must investigate it; we can't ethically or legally exclude that from our records or our evaluation. Please let us  know all of your questions or worries. Together, we can decide how best to manage them and how to minimize any unexpected costs, but we want to keep you safe above all else.   This disclosure is mandated by professional ethics and legal obligations, as healthcare providers must address any substantial health concerns raised during any patient interaction and a comprehensive ROS is required by insurance companies for billing preventive-care visit type.   This disclosure ultimately discourages patients financially from reporting significant health issues.  In addition to this disclaimer, all orders placed during this encounter were agreed upon after shared decision making, with no guarantees of insurance coverage able to be provided.    IMPORTANT HEALTH REMINDERS: Report any new or changing skin lesions promptly Maintain recommended screening schedules Discuss any new family history of cancer at future visits Follow up on any new symptoms that persist more than two weeks    Medical Screening Exam A medical screening exam (MSE) helps to determine whether you need immediate medical treatment relating to any number of symptoms you are having. This type of exam may be done in an emergency department, an urgent care setting, or your health care provider's office. Depending on your symptoms and severity, you may need additional tests or medical therapy. It is important to  note that an MSE does not necessarily mean that you will need or receive further medical testing or interventions if your symptoms are not deemed to be medically urgent (emergent). Tell a health care provider about: Any allergies you have. All medicines you are taking, including vitamins, herbs, eye drops, creams, and over-the-counter medicines. Any problems you or family members have had with anesthetic medicines. Any bleeding problems you have. Any surgeries you have had. Any medical conditions you have. Whether you are pregnant or may be pregnant. What happens during the test? During the exam, a health care provider does a short, often focused, physical exam and asks about your medical history to assess: Your current symptoms. Your overall health. Your need for possible further medical intervention. What can I expect after the test? If you have a regular health care provider, make an appointment for a follow-up visit with him or her. If you do not have a regular health care provider, ask about resources in your  community. Your medical screening exam may determine that: You do not need emergency treatment at this time. You need treatment right away. You need to be transferred to another medical center. This may happen if you need an emergent specialist or consultant that is not available at the medical center you are at. You need to have more tests. A medical specialist may be consulted if needed. Get help right away if: Your condition gets worse. You develop new or troubling symptoms before you see your health care provider. These symptoms may represent a serious problem that is an emergency. Do not wait to see if the symptoms will go away. Get medical help right away. Call your local emergency services (911 in the [place] Do not drive yourself to the hospital. Summary A medical screening exam helps to determine whether you need medical treatment right away. This type of exam may be done  in an emergency department, an urgent care setting, or your health care provider's office. During the exam, a health care provider does a short physical exam and asks about your current symptoms and overall health. Depending on the exam, more tests or therapies may be ordered. However, an MSE does not necessarily mean that you will have further medical testing if your symptoms are not deemed to be urgent. If you need further care that is not offered at your current medical center, you may need to be transferred to another facility. This information is not intended to replace advice given to you by your health care provider. Make sure you discuss any questions you have with your health care provider. Document Revised: 10/22/2020 Document Reviewed: 06/19/2020 Elsevier Patient Education  2024 [employer]  Health Maintenance, Female Adopting a healthy lifestyle and getting preventive care are important in promoting health and wellness. Ask your health care provider about: The right schedule for you to have regular tests and exams. Things you can do on your own to prevent diseases and keep yourself healthy. What should I know about diet, weight, and exercise? Eat a healthy diet  Eat a diet that includes plenty of vegetables, fruits, low-fat dairy products, and lean protein. Do not eat a lot of foods that are high in solid fats, added sugars, or sodium. Maintain a healthy weight Body mass index (BMI) is used to identify weight problems. It estimates body fat based on height and weight. Your health care provider can help determine your BMI and help you achieve or maintain a healthy weight. Get regular exercise Get regular exercise. This is one of the most important things you can do for your health. Most adults should: Exercise for at least 150 minutes each week. The exercise should increase your heart rate and make you sweat (moderate-intensity exercise). Do strengthening exercises at least twice  a week. This is in addition to the moderate-intensity exercise. Spend less time sitting. Even light physical activity can be beneficial. Watch cholesterol and blood lipids Have your blood tested for lipids and cholesterol at 76 years of age, then have this test every 5 years. Have your cholesterol levels checked more often if: Your lipid or cholesterol levels are high. You are older than 76 years of age. You are at high risk for heart disease. What should I know about cancer screening? Depending on your health history and family history, you may need to have cancer screening at various ages. This may include screening for: Breast cancer. Cervical cancer. Colorectal cancer. Skin cancer. Lung cancer. What should I know about heart  disease, diabetes, and high blood pressure? Blood pressure and heart disease High blood pressure causes heart disease and increases the risk of stroke. This is more likely to develop in people who have high blood pressure readings or are overweight. Have your blood pressure checked: Every 3-5 years if you are 36-26 years of age. Every year if you are 26 years old or older. Diabetes Have regular diabetes screenings. This checks your fasting blood sugar level. Have the screening done: Once every three years after age 35 if you are at a normal weight and have a low risk for diabetes. More often and at a younger age if you are overweight or have a high risk for diabetes. What should I know about preventing infection? Hepatitis B If you have a higher risk for hepatitis B, you should be screened for this virus. Talk with your health care provider to find out if you are at risk for hepatitis B infection. Hepatitis C Testing is recommended for: Everyone born from [birth year] through 1963. Anyone with known risk factors for hepatitis C. Sexually transmitted infections (STIs) Get screened for STIs, including gonorrhea and chlamydia, if: You are sexually active and are  younger than 76 years of age. You are older than 76 years of age and your health care provider tells you that you are at risk for this type of infection. Your sexual activity has changed since you were last screened, and you are at increased risk for chlamydia or gonorrhea. Ask your health care provider if you are at risk. Ask your health care provider about whether you are at high risk for HIV. Your health care provider may recommend a prescription medicine to help prevent HIV infection. If you choose to take medicine to prevent HIV, you should first get tested for HIV. You should then be tested every 3 months for as long as you are taking the medicine. Pregnancy If you are about to stop having your period (premenopausal) and you may become pregnant, seek counseling before you get pregnant. Take 400 to 800 micrograms (mcg) of folic acid every day if you become pregnant. Ask for birth control (contraception) if you want to prevent pregnancy. Osteoporosis and menopause Osteoporosis is a disease in which the bones lose minerals and strength with aging. This can result in bone fractures. If you are 51 years old or older, or if you are at risk for osteoporosis and fractures, ask your health care provider if you should: Be screened for bone loss. Take a calcium  or vitamin D  supplement to lower your risk of fractures. Be given hormone replacement therapy (HRT) to treat symptoms of menopause. Follow these instructions at home: Alcohol use Do not drink alcohol if: Your health care provider tells you not to drink. You are pregnant, may be pregnant, or are planning to become pregnant. If you drink alcohol: Limit how much you have to: 0-1 drink a day. Know how much alcohol is in your drink. In the [place] one drink equals one 12 oz bottle of beer (355 mL), one 5 oz glass of wine (148 mL), or one 1 oz glass of hard liquor (44 mL). Lifestyle Do not use any products that contain nicotine or tobacco. These  products include cigarettes, chewing tobacco, and vaping devices, such as e-cigarettes. If you need help quitting, ask your health care provider. Do not use street drugs. Do not share needles. Ask your health care provider for help if you need support or information about quitting drugs. General instructions Schedule  regular health, dental, and eye exams. Stay current with your vaccines. Tell your health care provider if: You often feel depressed. You have ever been abused or do not feel safe at home. Summary Adopting a healthy lifestyle and getting preventive care are important in promoting health and wellness. Follow your health care provider's instructions about healthy diet, exercising, and getting tested or screened for diseases. Follow your health care provider's instructions on monitoring your cholesterol and blood pressure. This information is not intended to replace advice given to you by your health care provider. Make sure you discuss any questions you have with your health care provider. Document Revised: 06/30/2020 Document Reviewed: 06/30/2020 Elsevier Patient Education  2024 [employer].

## 2023-08-04 ENCOUNTER — Ambulatory Visit: Payer: Self-pay | Admitting: Internal Medicine

## 2023-08-04 LAB — TSH RFX ON ABNORMAL TO FREE T4: TSH: 3.66 u[IU]/mL (ref 0.450–4.500)

## 2023-08-04 NOTE — Assessment & Plan Note (Signed)
 Will order lab testing to guide management.

## 2023-08-04 NOTE — Assessment & Plan Note (Signed)
 Aortic atherosclerosis presents with suboptimal LDL levels and a 17.5% ten-year myocardial infarction risk. Increase rosuvastatin  dosage if tolerated to lower LDL levels. Dietary modifications, including increased intake of avocados and extra virgin olive oil, are recommended. Regular cholesterol monitoring is necessary. Lowering LDL below 40 may reduce plaque and halve myocardial infarction risk.

## 2023-08-04 NOTE — Assessment & Plan Note (Signed)
 Prediabetes management aims to maintain an A1c below 6.1. Order an A1c test to monitor blood glucose levels. Advise reducing intake of chips and other junk food to manage blood glucose.

## 2023-08-09 ENCOUNTER — Other Ambulatory Visit: Payer: Self-pay | Admitting: Internal Medicine

## 2023-08-09 DIAGNOSIS — E039 Hypothyroidism, unspecified: Secondary | ICD-10-CM

## 2023-08-16 ENCOUNTER — Encounter: Payer: Medicare PPO | Admitting: Internal Medicine

## 2023-10-13 DIAGNOSIS — M810 Age-related osteoporosis without current pathological fracture: Secondary | ICD-10-CM | POA: Diagnosis not present

## 2023-10-14 DIAGNOSIS — D2272 Melanocytic nevi of left lower limb, including hip: Secondary | ICD-10-CM | POA: Diagnosis not present

## 2023-10-14 DIAGNOSIS — L821 Other seborrheic keratosis: Secondary | ICD-10-CM | POA: Diagnosis not present

## 2023-10-14 DIAGNOSIS — L578 Other skin changes due to chronic exposure to nonionizing radiation: Secondary | ICD-10-CM | POA: Diagnosis not present

## 2023-10-14 DIAGNOSIS — D2371 Other benign neoplasm of skin of right lower limb, including hip: Secondary | ICD-10-CM | POA: Diagnosis not present

## 2023-10-14 DIAGNOSIS — D2262 Melanocytic nevi of left upper limb, including shoulder: Secondary | ICD-10-CM | POA: Diagnosis not present

## 2023-10-14 DIAGNOSIS — L570 Actinic keratosis: Secondary | ICD-10-CM | POA: Diagnosis not present

## 2023-10-14 DIAGNOSIS — D2372 Other benign neoplasm of skin of left lower limb, including hip: Secondary | ICD-10-CM | POA: Diagnosis not present

## 2023-10-14 DIAGNOSIS — Z85828 Personal history of other malignant neoplasm of skin: Secondary | ICD-10-CM | POA: Diagnosis not present

## 2023-10-14 DIAGNOSIS — L820 Inflamed seborrheic keratosis: Secondary | ICD-10-CM | POA: Diagnosis not present

## 2023-10-24 IMAGING — MG MM DIGITAL SCREENING BILAT W/ TOMO AND CAD
8 series · 9 of 24 positions shown · non-contrast
Comparison: Previous exam(s).

CLINICAL DATA: Screening.

EXAM:
DIGITAL SCREENING BILATERAL MAMMOGRAM WITH TOMOSYNTHESIS AND CAD
TECHNIQUE: Bilateral screening digital craniocaudal and mediolateral oblique
mammograms were obtained. Bilateral screening digital breast
tomosynthesis was performed. The images were evaluated with
computer-aided detection.

[L CC synth-2D]
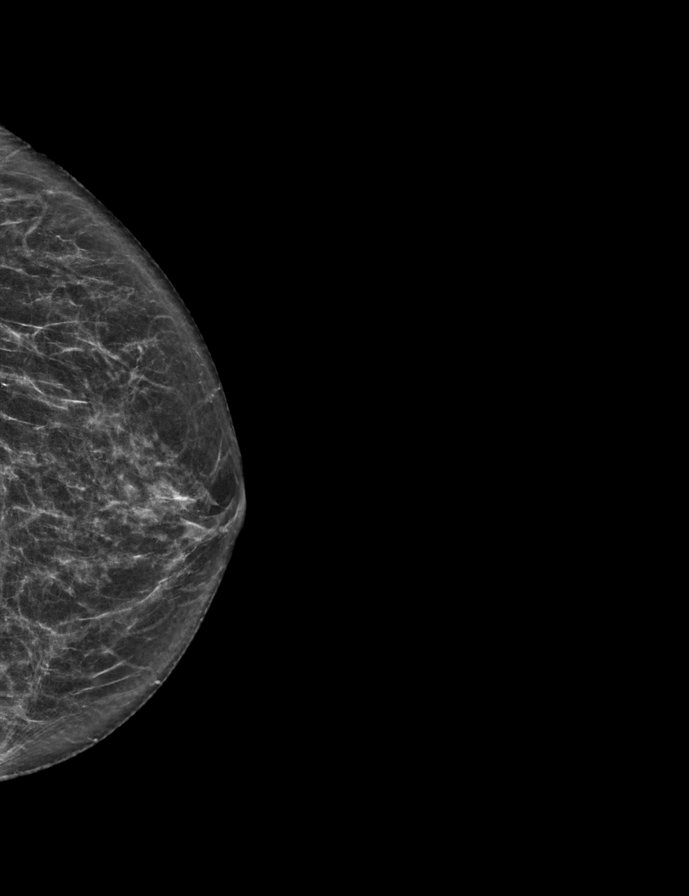

[L MLO synth-2D]
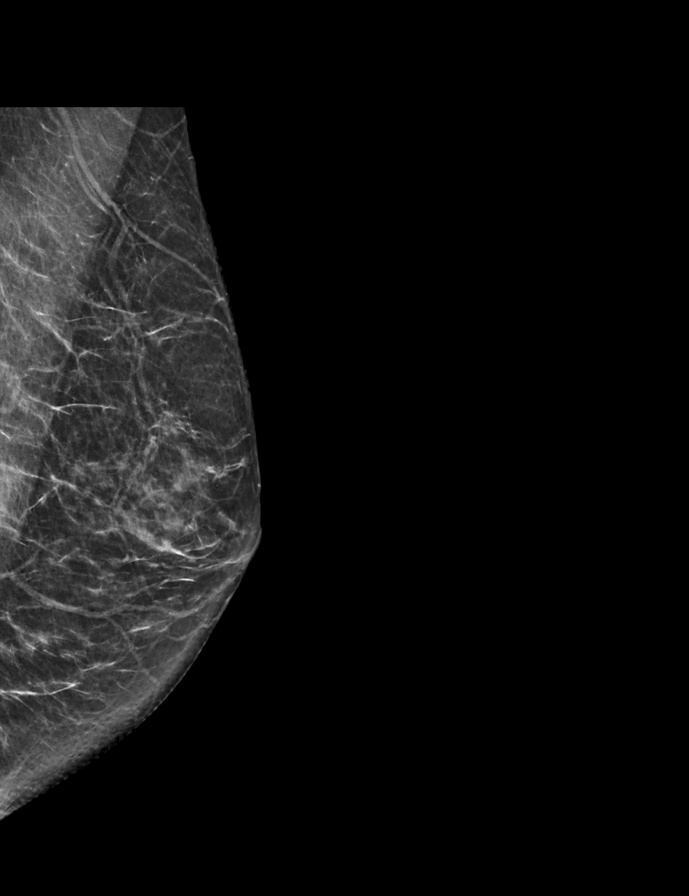

[R MLO synth-2D]
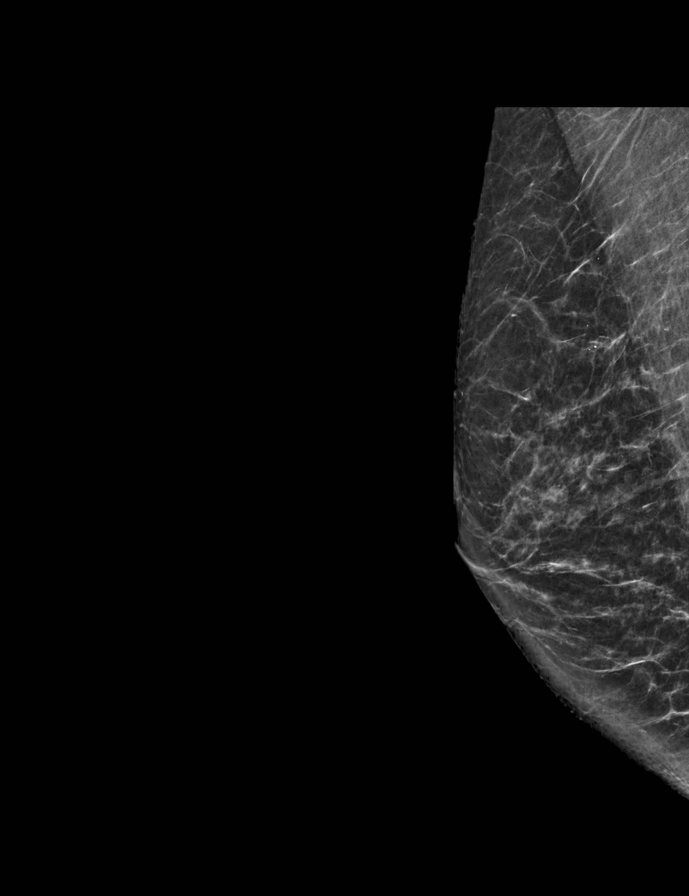

[R CC synth-2D]
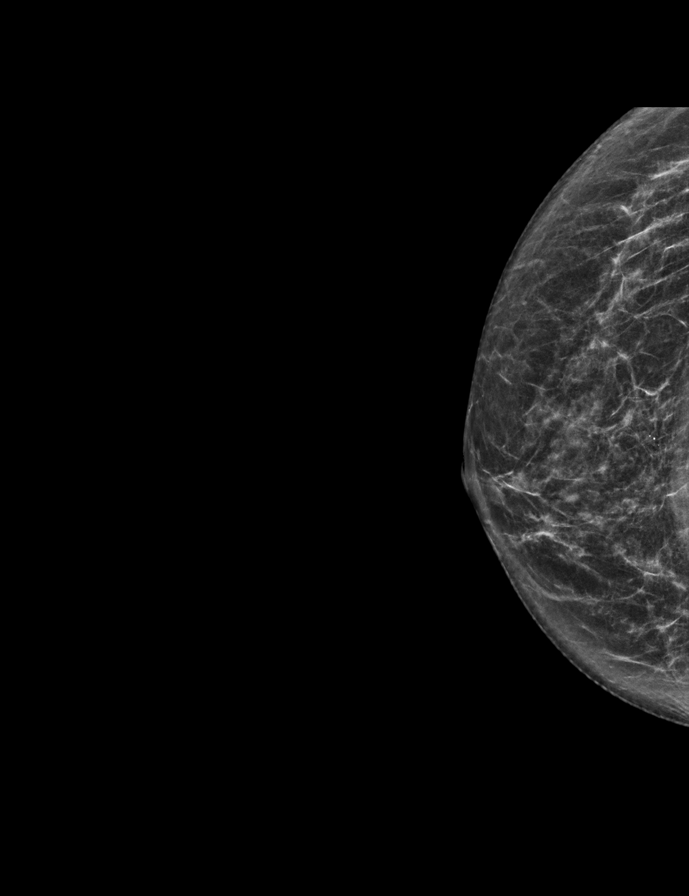

[L MLO tomo · 2 of 62 frames shown]
[frame 21/62]
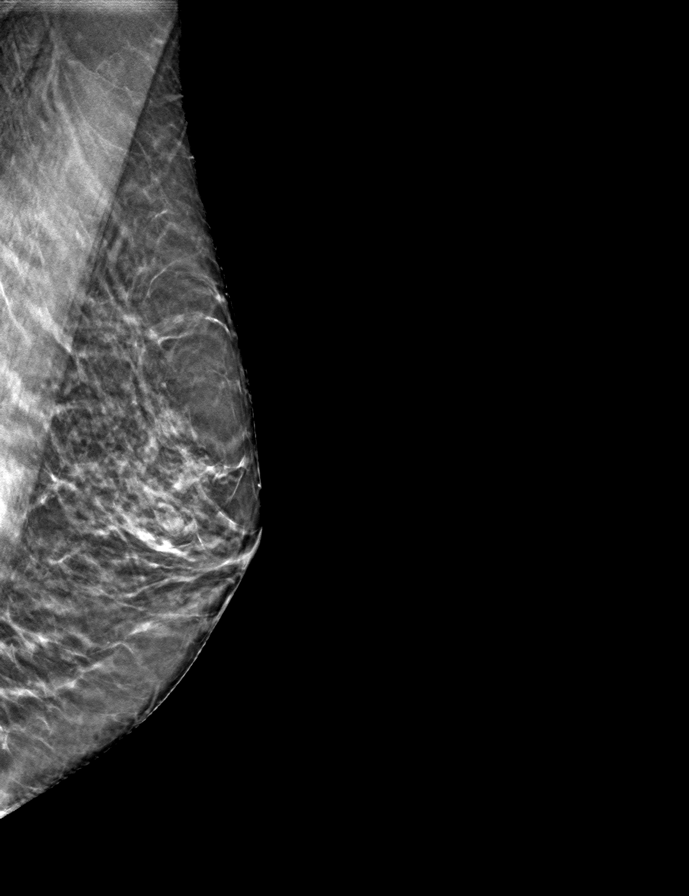
[frame 31/62]
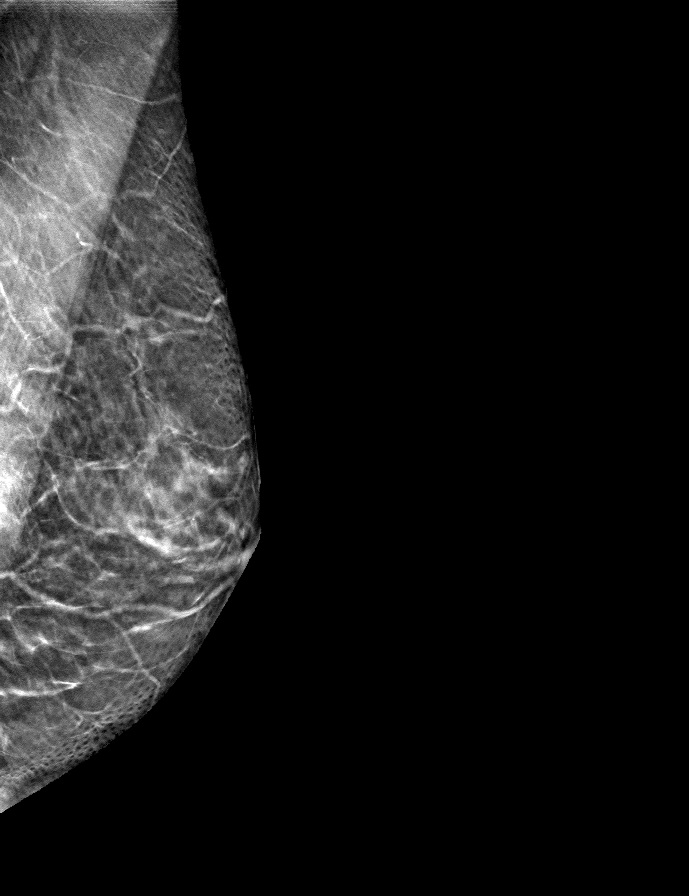

[R MLO tomo · tomo slice 29/57.0]
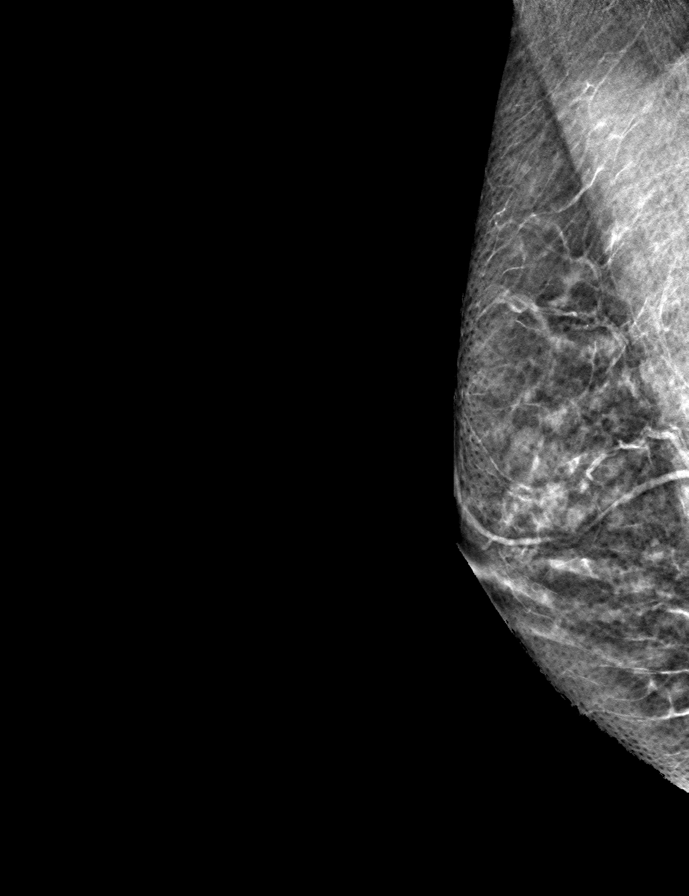

[L CC tomo · tomo slice 31/61.0]
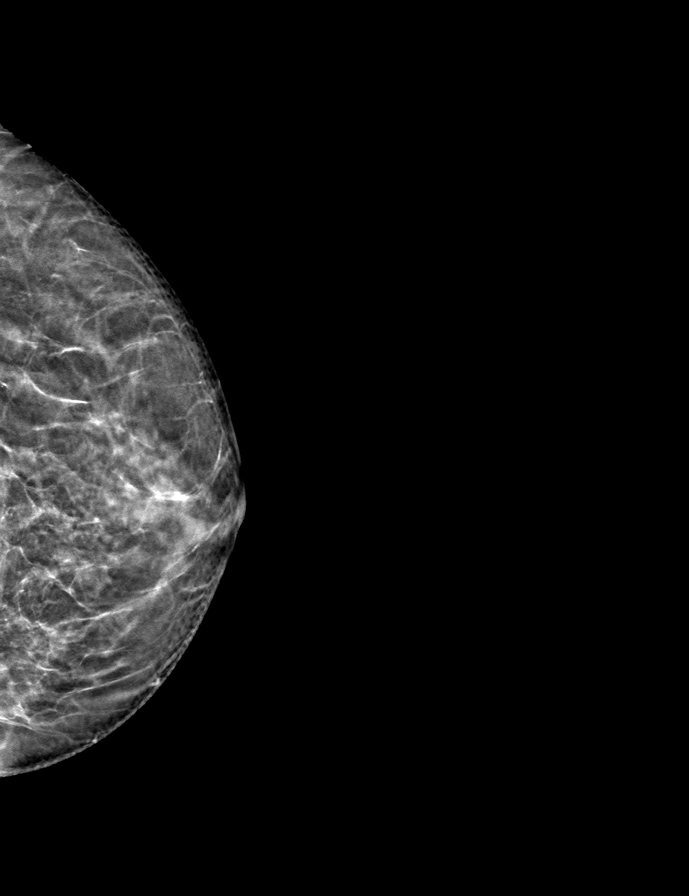

[R CC tomo · tomo slice 29/57.0]
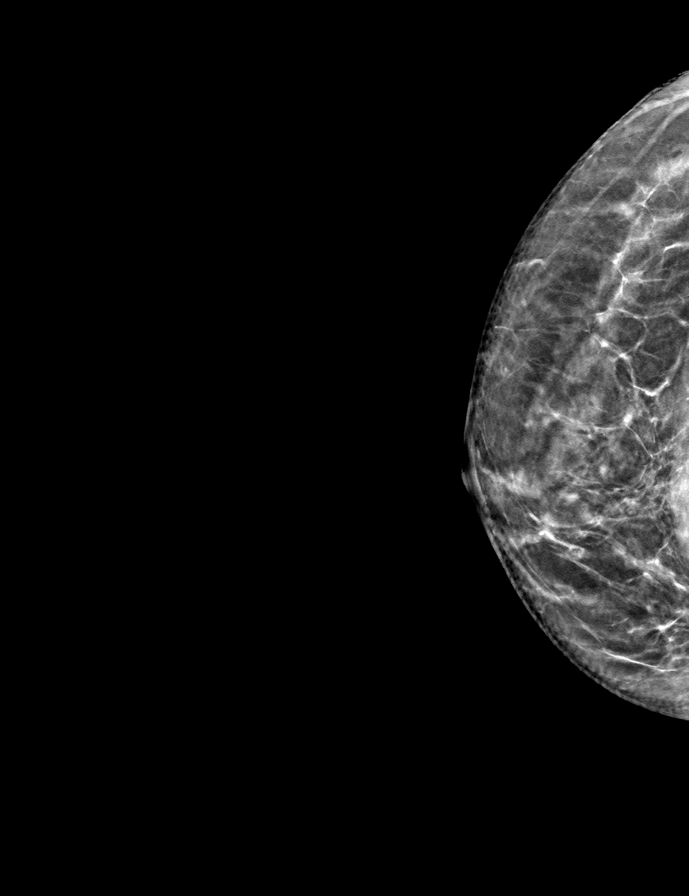

[9 of 24 positions shown; findings below may reference images not displayed]

ACR Breast Density Category b: There are scattered areas of
fibroglandular density.
FINDINGS: There are no findings suspicious for malignancy.
IMPRESSION: No mammographic evidence of malignancy. A result letter of this
screening mammogram will be mailed directly to the patient.

RECOMMENDATION:
Screening mammogram in one year. (Code:51-O-LD2)

BI-RADS CATEGORY  1: Negative.

## 2023-11-22 DIAGNOSIS — H524 Presbyopia: Secondary | ICD-10-CM | POA: Diagnosis not present

## 2023-11-22 DIAGNOSIS — H5213 Myopia, bilateral: Secondary | ICD-10-CM | POA: Diagnosis not present

## 2023-11-22 DIAGNOSIS — H2513 Age-related nuclear cataract, bilateral: Secondary | ICD-10-CM | POA: Diagnosis not present

## 2023-11-22 DIAGNOSIS — H25013 Cortical age-related cataract, bilateral: Secondary | ICD-10-CM | POA: Diagnosis not present

## 2023-11-22 DIAGNOSIS — H401132 Primary open-angle glaucoma, bilateral, moderate stage: Secondary | ICD-10-CM | POA: Diagnosis not present

## 2023-12-06 ENCOUNTER — Other Ambulatory Visit: Payer: Medicare PPO

## 2023-12-16 DIAGNOSIS — H401221 Low-tension glaucoma, left eye, mild stage: Secondary | ICD-10-CM | POA: Diagnosis not present

## 2023-12-16 DIAGNOSIS — H401213 Low-tension glaucoma, right eye, severe stage: Secondary | ICD-10-CM | POA: Diagnosis not present

## 2023-12-16 DIAGNOSIS — H25813 Combined forms of age-related cataract, bilateral: Secondary | ICD-10-CM | POA: Diagnosis not present

## 2023-12-16 DIAGNOSIS — H35373 Puckering of macula, bilateral: Secondary | ICD-10-CM | POA: Diagnosis not present

## 2023-12-28 DIAGNOSIS — D1721 Benign lipomatous neoplasm of skin and subcutaneous tissue of right arm: Secondary | ICD-10-CM | POA: Diagnosis not present

## 2023-12-28 DIAGNOSIS — L989 Disorder of the skin and subcutaneous tissue, unspecified: Secondary | ICD-10-CM | POA: Diagnosis not present

## 2024-02-09 ENCOUNTER — Other Ambulatory Visit: Payer: Self-pay | Admitting: Internal Medicine

## 2024-02-09 DIAGNOSIS — I2510 Atherosclerotic heart disease of native coronary artery without angina pectoris: Secondary | ICD-10-CM

## 2024-06-18 ENCOUNTER — Ambulatory Visit

## 2024-08-07 ENCOUNTER — Encounter: Admitting: Internal Medicine
